# Patient Record
Sex: Male | Born: 1935
Health system: Southern US, Community
[De-identification: ages and names within clinical notes are randomized; demographics above are authoritative.]

## PROBLEM LIST (undated history)

## (undated) DIAGNOSIS — R55 Syncope and collapse: Secondary | ICD-10-CM

## (undated) DIAGNOSIS — M5430 Sciatica, unspecified side: Secondary | ICD-10-CM

## (undated) DIAGNOSIS — I10 Essential (primary) hypertension: Secondary | ICD-10-CM

## (undated) DIAGNOSIS — D696 Thrombocytopenia, unspecified: Secondary | ICD-10-CM

## (undated) DIAGNOSIS — I2510 Atherosclerotic heart disease of native coronary artery without angina pectoris: Secondary | ICD-10-CM

## (undated) DIAGNOSIS — E079 Disorder of thyroid, unspecified: Secondary | ICD-10-CM

## (undated) DIAGNOSIS — H539 Unspecified visual disturbance: Secondary | ICD-10-CM

## (undated) DIAGNOSIS — F0390 Unspecified dementia without behavioral disturbance: Secondary | ICD-10-CM

## (undated) DIAGNOSIS — J939 Pneumothorax, unspecified: Secondary | ICD-10-CM

## (undated) DIAGNOSIS — R413 Other amnesia: Secondary | ICD-10-CM

## (undated) DIAGNOSIS — I6529 Occlusion and stenosis of unspecified carotid artery: Secondary | ICD-10-CM

## (undated) DIAGNOSIS — I4891 Unspecified atrial fibrillation: Secondary | ICD-10-CM

## (undated) HISTORY — PX: ACHILLES TENDON REPAIR: SUR1153

## (undated) HISTORY — PX: CORONARY ANGIOPLASTY WITH STENT PLACEMENT: SHX49

## (undated) HISTORY — PX: CATARACT EXTRACTION: SUR2

## (undated) HISTORY — DX: Syncope and collapse: R55

## (undated) HISTORY — PX: INGUINAL HERNIA REPAIR: SUR1180

## (undated) HISTORY — PX: CAROTID ENDARTERECTOMY: SUR193

## (undated) HISTORY — PX: HERNIA REPAIR: SHX51

## (undated) HISTORY — PX: TONSILLECTOMY: SUR1361

## (undated) HISTORY — DX: Other amnesia: R41.3

## (undated) HISTORY — DX: Unspecified visual disturbance: H53.9

---

## 2007-03-21 HISTORY — PX: CORONARY STENT PLACEMENT: SHX1402

## 2011-04-21 DIAGNOSIS — B070 Plantar wart: Secondary | ICD-10-CM | POA: Diagnosis not present

## 2011-04-21 DIAGNOSIS — B351 Tinea unguium: Secondary | ICD-10-CM | POA: Diagnosis not present

## 2011-04-21 DIAGNOSIS — L600 Ingrowing nail: Secondary | ICD-10-CM | POA: Diagnosis not present

## 2011-04-24 DIAGNOSIS — IMO0002 Reserved for concepts with insufficient information to code with codable children: Secondary | ICD-10-CM | POA: Diagnosis not present

## 2011-04-26 DIAGNOSIS — M549 Dorsalgia, unspecified: Secondary | ICD-10-CM | POA: Diagnosis not present

## 2011-04-26 DIAGNOSIS — I872 Venous insufficiency (chronic) (peripheral): Secondary | ICD-10-CM | POA: Diagnosis not present

## 2011-04-26 DIAGNOSIS — M479 Spondylosis, unspecified: Secondary | ICD-10-CM | POA: Diagnosis not present

## 2011-06-26 DIAGNOSIS — I4891 Unspecified atrial fibrillation: Secondary | ICD-10-CM | POA: Diagnosis not present

## 2011-06-26 DIAGNOSIS — I2510 Atherosclerotic heart disease of native coronary artery without angina pectoris: Secondary | ICD-10-CM | POA: Diagnosis not present

## 2011-06-26 DIAGNOSIS — I6350 Cerebral infarction due to unspecified occlusion or stenosis of unspecified cerebral artery: Secondary | ICD-10-CM | POA: Diagnosis not present

## 2011-07-12 DIAGNOSIS — R609 Edema, unspecified: Secondary | ICD-10-CM | POA: Diagnosis not present

## 2011-07-12 DIAGNOSIS — I517 Cardiomegaly: Secondary | ICD-10-CM | POA: Diagnosis not present

## 2011-07-12 DIAGNOSIS — I6529 Occlusion and stenosis of unspecified carotid artery: Secondary | ICD-10-CM | POA: Diagnosis not present

## 2011-07-12 DIAGNOSIS — I658 Occlusion and stenosis of other precerebral arteries: Secondary | ICD-10-CM | POA: Diagnosis not present

## 2011-07-17 DIAGNOSIS — I10 Essential (primary) hypertension: Secondary | ICD-10-CM | POA: Diagnosis not present

## 2011-07-17 DIAGNOSIS — E785 Hyperlipidemia, unspecified: Secondary | ICD-10-CM | POA: Diagnosis not present

## 2011-07-17 DIAGNOSIS — I2510 Atherosclerotic heart disease of native coronary artery without angina pectoris: Secondary | ICD-10-CM | POA: Diagnosis not present

## 2011-07-17 DIAGNOSIS — I4891 Unspecified atrial fibrillation: Secondary | ICD-10-CM | POA: Diagnosis not present

## 2011-07-27 DIAGNOSIS — H04129 Dry eye syndrome of unspecified lacrimal gland: Secondary | ICD-10-CM | POA: Diagnosis not present

## 2011-07-27 DIAGNOSIS — I951 Orthostatic hypotension: Secondary | ICD-10-CM | POA: Diagnosis not present

## 2011-07-27 DIAGNOSIS — R609 Edema, unspecified: Secondary | ICD-10-CM | POA: Diagnosis not present

## 2011-07-27 DIAGNOSIS — R7309 Other abnormal glucose: Secondary | ICD-10-CM | POA: Diagnosis not present

## 2011-08-03 DIAGNOSIS — I4891 Unspecified atrial fibrillation: Secondary | ICD-10-CM | POA: Diagnosis not present

## 2011-08-03 DIAGNOSIS — I6529 Occlusion and stenosis of unspecified carotid artery: Secondary | ICD-10-CM | POA: Diagnosis not present

## 2011-08-03 DIAGNOSIS — R413 Other amnesia: Secondary | ICD-10-CM | POA: Diagnosis not present

## 2011-08-07 DIAGNOSIS — H2510 Age-related nuclear cataract, unspecified eye: Secondary | ICD-10-CM | POA: Diagnosis not present

## 2011-08-28 DIAGNOSIS — L600 Ingrowing nail: Secondary | ICD-10-CM | POA: Diagnosis not present

## 2011-08-28 DIAGNOSIS — B351 Tinea unguium: Secondary | ICD-10-CM | POA: Diagnosis not present

## 2011-10-12 DIAGNOSIS — I6529 Occlusion and stenosis of unspecified carotid artery: Secondary | ICD-10-CM | POA: Diagnosis not present

## 2011-10-12 DIAGNOSIS — R413 Other amnesia: Secondary | ICD-10-CM | POA: Diagnosis not present

## 2011-10-12 DIAGNOSIS — I4891 Unspecified atrial fibrillation: Secondary | ICD-10-CM | POA: Diagnosis not present

## 2011-10-12 DIAGNOSIS — M549 Dorsalgia, unspecified: Secondary | ICD-10-CM | POA: Diagnosis not present

## 2011-10-19 DIAGNOSIS — I779 Disorder of arteries and arterioles, unspecified: Secondary | ICD-10-CM | POA: Diagnosis not present

## 2011-10-19 DIAGNOSIS — I6529 Occlusion and stenosis of unspecified carotid artery: Secondary | ICD-10-CM | POA: Diagnosis not present

## 2011-10-26 DIAGNOSIS — H2510 Age-related nuclear cataract, unspecified eye: Secondary | ICD-10-CM | POA: Diagnosis not present

## 2011-11-06 DIAGNOSIS — H259 Unspecified age-related cataract: Secondary | ICD-10-CM | POA: Diagnosis not present

## 2011-11-06 DIAGNOSIS — H2510 Age-related nuclear cataract, unspecified eye: Secondary | ICD-10-CM | POA: Diagnosis not present

## 2011-11-23 DIAGNOSIS — R413 Other amnesia: Secondary | ICD-10-CM | POA: Diagnosis not present

## 2011-11-23 DIAGNOSIS — M549 Dorsalgia, unspecified: Secondary | ICD-10-CM | POA: Diagnosis not present

## 2011-11-23 DIAGNOSIS — I4891 Unspecified atrial fibrillation: Secondary | ICD-10-CM | POA: Diagnosis not present

## 2011-11-23 DIAGNOSIS — I6529 Occlusion and stenosis of unspecified carotid artery: Secondary | ICD-10-CM | POA: Diagnosis not present

## 2011-11-30 DIAGNOSIS — H2510 Age-related nuclear cataract, unspecified eye: Secondary | ICD-10-CM | POA: Diagnosis not present

## 2011-12-01 DIAGNOSIS — I6789 Other cerebrovascular disease: Secondary | ICD-10-CM | POA: Diagnosis not present

## 2011-12-01 DIAGNOSIS — I6529 Occlusion and stenosis of unspecified carotid artery: Secondary | ICD-10-CM | POA: Diagnosis not present

## 2011-12-01 DIAGNOSIS — R413 Other amnesia: Secondary | ICD-10-CM | POA: Diagnosis not present

## 2011-12-07 DIAGNOSIS — I6529 Occlusion and stenosis of unspecified carotid artery: Secondary | ICD-10-CM | POA: Diagnosis not present

## 2011-12-07 DIAGNOSIS — M549 Dorsalgia, unspecified: Secondary | ICD-10-CM | POA: Diagnosis not present

## 2011-12-07 DIAGNOSIS — R413 Other amnesia: Secondary | ICD-10-CM | POA: Diagnosis not present

## 2011-12-07 DIAGNOSIS — I4891 Unspecified atrial fibrillation: Secondary | ICD-10-CM | POA: Diagnosis not present

## 2011-12-11 DIAGNOSIS — I6529 Occlusion and stenosis of unspecified carotid artery: Secondary | ICD-10-CM | POA: Diagnosis not present

## 2011-12-18 DIAGNOSIS — H259 Unspecified age-related cataract: Secondary | ICD-10-CM | POA: Diagnosis not present

## 2011-12-18 DIAGNOSIS — H2510 Age-related nuclear cataract, unspecified eye: Secondary | ICD-10-CM | POA: Diagnosis not present

## 2012-01-04 DIAGNOSIS — I6529 Occlusion and stenosis of unspecified carotid artery: Secondary | ICD-10-CM | POA: Diagnosis not present

## 2012-01-04 DIAGNOSIS — E785 Hyperlipidemia, unspecified: Secondary | ICD-10-CM | POA: Diagnosis not present

## 2012-01-04 DIAGNOSIS — G609 Hereditary and idiopathic neuropathy, unspecified: Secondary | ICD-10-CM | POA: Diagnosis not present

## 2012-01-04 DIAGNOSIS — R413 Other amnesia: Secondary | ICD-10-CM | POA: Diagnosis not present

## 2012-01-04 DIAGNOSIS — Z23 Encounter for immunization: Secondary | ICD-10-CM | POA: Diagnosis not present

## 2012-01-05 DIAGNOSIS — E785 Hyperlipidemia, unspecified: Secondary | ICD-10-CM | POA: Diagnosis not present

## 2012-01-05 DIAGNOSIS — Z125 Encounter for screening for malignant neoplasm of prostate: Secondary | ICD-10-CM | POA: Diagnosis not present

## 2012-01-05 DIAGNOSIS — I10 Essential (primary) hypertension: Secondary | ICD-10-CM | POA: Diagnosis not present

## 2012-01-05 DIAGNOSIS — Z79899 Other long term (current) drug therapy: Secondary | ICD-10-CM | POA: Diagnosis not present

## 2012-01-08 DIAGNOSIS — I4891 Unspecified atrial fibrillation: Secondary | ICD-10-CM | POA: Diagnosis not present

## 2012-01-08 DIAGNOSIS — E785 Hyperlipidemia, unspecified: Secondary | ICD-10-CM | POA: Diagnosis not present

## 2012-01-08 DIAGNOSIS — I2510 Atherosclerotic heart disease of native coronary artery without angina pectoris: Secondary | ICD-10-CM | POA: Diagnosis not present

## 2012-01-08 DIAGNOSIS — I359 Nonrheumatic aortic valve disorder, unspecified: Secondary | ICD-10-CM | POA: Diagnosis not present

## 2012-04-04 DIAGNOSIS — R413 Other amnesia: Secondary | ICD-10-CM | POA: Diagnosis not present

## 2012-04-04 DIAGNOSIS — M549 Dorsalgia, unspecified: Secondary | ICD-10-CM | POA: Diagnosis not present

## 2012-04-04 DIAGNOSIS — I4891 Unspecified atrial fibrillation: Secondary | ICD-10-CM | POA: Diagnosis not present

## 2012-04-04 DIAGNOSIS — I6529 Occlusion and stenosis of unspecified carotid artery: Secondary | ICD-10-CM | POA: Diagnosis not present

## 2012-04-23 DIAGNOSIS — H26499 Other secondary cataract, unspecified eye: Secondary | ICD-10-CM | POA: Diagnosis not present

## 2012-04-26 DIAGNOSIS — B351 Tinea unguium: Secondary | ICD-10-CM | POA: Diagnosis not present

## 2012-04-26 DIAGNOSIS — L600 Ingrowing nail: Secondary | ICD-10-CM | POA: Diagnosis not present

## 2012-06-10 DIAGNOSIS — I6529 Occlusion and stenosis of unspecified carotid artery: Secondary | ICD-10-CM | POA: Diagnosis not present

## 2012-06-12 DIAGNOSIS — I6529 Occlusion and stenosis of unspecified carotid artery: Secondary | ICD-10-CM | POA: Diagnosis not present

## 2012-06-26 DIAGNOSIS — H26499 Other secondary cataract, unspecified eye: Secondary | ICD-10-CM | POA: Diagnosis not present

## 2012-06-27 DIAGNOSIS — I517 Cardiomegaly: Secondary | ICD-10-CM | POA: Diagnosis not present

## 2012-06-27 DIAGNOSIS — I080 Rheumatic disorders of both mitral and aortic valves: Secondary | ICD-10-CM | POA: Diagnosis not present

## 2012-06-27 DIAGNOSIS — I4891 Unspecified atrial fibrillation: Secondary | ICD-10-CM | POA: Diagnosis not present

## 2012-06-27 DIAGNOSIS — I359 Nonrheumatic aortic valve disorder, unspecified: Secondary | ICD-10-CM | POA: Diagnosis not present

## 2012-07-04 DIAGNOSIS — I10 Essential (primary) hypertension: Secondary | ICD-10-CM | POA: Diagnosis not present

## 2012-07-04 DIAGNOSIS — R413 Other amnesia: Secondary | ICD-10-CM | POA: Diagnosis not present

## 2012-07-04 DIAGNOSIS — E785 Hyperlipidemia, unspecified: Secondary | ICD-10-CM | POA: Diagnosis not present

## 2012-07-15 DIAGNOSIS — L600 Ingrowing nail: Secondary | ICD-10-CM | POA: Diagnosis not present

## 2012-07-15 DIAGNOSIS — B351 Tinea unguium: Secondary | ICD-10-CM | POA: Diagnosis not present

## 2012-07-17 DIAGNOSIS — H26499 Other secondary cataract, unspecified eye: Secondary | ICD-10-CM | POA: Diagnosis not present

## 2012-07-30 DIAGNOSIS — R609 Edema, unspecified: Secondary | ICD-10-CM | POA: Diagnosis not present

## 2012-07-30 DIAGNOSIS — I6529 Occlusion and stenosis of unspecified carotid artery: Secondary | ICD-10-CM | POA: Diagnosis not present

## 2012-07-30 DIAGNOSIS — I359 Nonrheumatic aortic valve disorder, unspecified: Secondary | ICD-10-CM | POA: Diagnosis not present

## 2012-10-15 DIAGNOSIS — M779 Enthesopathy, unspecified: Secondary | ICD-10-CM | POA: Diagnosis not present

## 2012-10-15 DIAGNOSIS — M7660 Achilles tendinitis, unspecified leg: Secondary | ICD-10-CM | POA: Diagnosis not present

## 2012-10-18 DIAGNOSIS — M779 Enthesopathy, unspecified: Secondary | ICD-10-CM | POA: Diagnosis not present

## 2012-10-18 DIAGNOSIS — M7660 Achilles tendinitis, unspecified leg: Secondary | ICD-10-CM | POA: Diagnosis not present

## 2012-11-04 DIAGNOSIS — B351 Tinea unguium: Secondary | ICD-10-CM | POA: Diagnosis not present

## 2012-11-04 DIAGNOSIS — L600 Ingrowing nail: Secondary | ICD-10-CM | POA: Diagnosis not present

## 2012-11-28 DIAGNOSIS — M7660 Achilles tendinitis, unspecified leg: Secondary | ICD-10-CM | POA: Diagnosis not present

## 2012-11-28 DIAGNOSIS — M779 Enthesopathy, unspecified: Secondary | ICD-10-CM | POA: Diagnosis not present

## 2012-12-11 DIAGNOSIS — I4891 Unspecified atrial fibrillation: Secondary | ICD-10-CM | POA: Diagnosis not present

## 2012-12-11 DIAGNOSIS — I10 Essential (primary) hypertension: Secondary | ICD-10-CM | POA: Diagnosis not present

## 2012-12-11 DIAGNOSIS — E785 Hyperlipidemia, unspecified: Secondary | ICD-10-CM | POA: Diagnosis not present

## 2012-12-17 DIAGNOSIS — F0280 Dementia in other diseases classified elsewhere without behavioral disturbance: Secondary | ICD-10-CM | POA: Diagnosis not present

## 2012-12-19 DIAGNOSIS — H353190 Nonexudative age-related macular degeneration, unspecified eye, stage unspecified: Secondary | ICD-10-CM | POA: Diagnosis not present

## 2012-12-20 DIAGNOSIS — M7660 Achilles tendinitis, unspecified leg: Secondary | ICD-10-CM | POA: Diagnosis not present

## 2012-12-20 DIAGNOSIS — M779 Enthesopathy, unspecified: Secondary | ICD-10-CM | POA: Diagnosis not present

## 2012-12-27 DIAGNOSIS — I10 Essential (primary) hypertension: Secondary | ICD-10-CM | POA: Diagnosis not present

## 2012-12-27 DIAGNOSIS — E785 Hyperlipidemia, unspecified: Secondary | ICD-10-CM | POA: Diagnosis not present

## 2012-12-27 DIAGNOSIS — R7309 Other abnormal glucose: Secondary | ICD-10-CM | POA: Diagnosis not present

## 2012-12-27 DIAGNOSIS — M899 Disorder of bone, unspecified: Secondary | ICD-10-CM | POA: Diagnosis not present

## 2013-01-03 DIAGNOSIS — R7309 Other abnormal glucose: Secondary | ICD-10-CM | POA: Diagnosis not present

## 2013-01-03 DIAGNOSIS — F0280 Dementia in other diseases classified elsewhere without behavioral disturbance: Secondary | ICD-10-CM | POA: Diagnosis not present

## 2013-01-03 DIAGNOSIS — H905 Unspecified sensorineural hearing loss: Secondary | ICD-10-CM | POA: Diagnosis not present

## 2013-01-03 DIAGNOSIS — Z23 Encounter for immunization: Secondary | ICD-10-CM | POA: Diagnosis not present

## 2013-01-03 DIAGNOSIS — Z0000 Encounter for general adult medical examination without abnormal findings: Secondary | ICD-10-CM | POA: Diagnosis not present

## 2013-01-16 DIAGNOSIS — H6120 Impacted cerumen, unspecified ear: Secondary | ICD-10-CM | POA: Diagnosis not present

## 2013-01-16 DIAGNOSIS — H903 Sensorineural hearing loss, bilateral: Secondary | ICD-10-CM | POA: Diagnosis not present

## 2013-01-16 DIAGNOSIS — H9319 Tinnitus, unspecified ear: Secondary | ICD-10-CM | POA: Diagnosis not present

## 2013-01-16 DIAGNOSIS — J310 Chronic rhinitis: Secondary | ICD-10-CM | POA: Diagnosis not present

## 2013-01-16 DIAGNOSIS — H905 Unspecified sensorineural hearing loss: Secondary | ICD-10-CM | POA: Diagnosis not present

## 2013-01-17 DIAGNOSIS — H905 Unspecified sensorineural hearing loss: Secondary | ICD-10-CM | POA: Diagnosis not present

## 2013-03-10 DIAGNOSIS — M779 Enthesopathy, unspecified: Secondary | ICD-10-CM | POA: Diagnosis not present

## 2013-03-10 DIAGNOSIS — M7660 Achilles tendinitis, unspecified leg: Secondary | ICD-10-CM | POA: Diagnosis not present

## 2013-04-09 DIAGNOSIS — H9319 Tinnitus, unspecified ear: Secondary | ICD-10-CM | POA: Diagnosis not present

## 2013-04-09 DIAGNOSIS — H903 Sensorineural hearing loss, bilateral: Secondary | ICD-10-CM | POA: Diagnosis not present

## 2013-04-09 DIAGNOSIS — J310 Chronic rhinitis: Secondary | ICD-10-CM | POA: Diagnosis not present

## 2013-04-09 DIAGNOSIS — J3089 Other allergic rhinitis: Secondary | ICD-10-CM | POA: Diagnosis not present

## 2013-04-14 DIAGNOSIS — F0280 Dementia in other diseases classified elsewhere without behavioral disturbance: Secondary | ICD-10-CM | POA: Diagnosis not present

## 2013-04-14 DIAGNOSIS — G309 Alzheimer's disease, unspecified: Secondary | ICD-10-CM | POA: Diagnosis not present

## 2013-04-14 DIAGNOSIS — G609 Hereditary and idiopathic neuropathy, unspecified: Secondary | ICD-10-CM | POA: Diagnosis not present

## 2013-04-14 DIAGNOSIS — H9319 Tinnitus, unspecified ear: Secondary | ICD-10-CM | POA: Diagnosis not present

## 2013-05-02 DIAGNOSIS — B351 Tinea unguium: Secondary | ICD-10-CM | POA: Diagnosis not present

## 2013-05-02 DIAGNOSIS — L600 Ingrowing nail: Secondary | ICD-10-CM | POA: Diagnosis not present

## 2013-05-16 DIAGNOSIS — L600 Ingrowing nail: Secondary | ICD-10-CM | POA: Diagnosis not present

## 2013-05-16 DIAGNOSIS — B351 Tinea unguium: Secondary | ICD-10-CM | POA: Diagnosis not present

## 2013-06-10 DIAGNOSIS — R609 Edema, unspecified: Secondary | ICD-10-CM | POA: Diagnosis not present

## 2013-06-10 DIAGNOSIS — I4891 Unspecified atrial fibrillation: Secondary | ICD-10-CM | POA: Diagnosis not present

## 2013-06-10 DIAGNOSIS — I10 Essential (primary) hypertension: Secondary | ICD-10-CM | POA: Diagnosis not present

## 2013-06-10 DIAGNOSIS — I359 Nonrheumatic aortic valve disorder, unspecified: Secondary | ICD-10-CM | POA: Diagnosis not present

## 2013-06-11 DIAGNOSIS — I6529 Occlusion and stenosis of unspecified carotid artery: Secondary | ICD-10-CM | POA: Diagnosis not present

## 2013-06-13 DIAGNOSIS — I6529 Occlusion and stenosis of unspecified carotid artery: Secondary | ICD-10-CM | POA: Diagnosis not present

## 2013-06-19 DIAGNOSIS — H353190 Nonexudative age-related macular degeneration, unspecified eye, stage unspecified: Secondary | ICD-10-CM | POA: Diagnosis not present

## 2013-06-24 DIAGNOSIS — I359 Nonrheumatic aortic valve disorder, unspecified: Secondary | ICD-10-CM | POA: Diagnosis not present

## 2013-06-24 DIAGNOSIS — I10 Essential (primary) hypertension: Secondary | ICD-10-CM | POA: Diagnosis not present

## 2013-06-24 DIAGNOSIS — I517 Cardiomegaly: Secondary | ICD-10-CM | POA: Diagnosis not present

## 2013-06-24 DIAGNOSIS — I059 Rheumatic mitral valve disease, unspecified: Secondary | ICD-10-CM | POA: Diagnosis not present

## 2013-06-24 DIAGNOSIS — I4891 Unspecified atrial fibrillation: Secondary | ICD-10-CM | POA: Diagnosis not present

## 2013-06-24 DIAGNOSIS — R609 Edema, unspecified: Secondary | ICD-10-CM | POA: Diagnosis not present

## 2013-06-25 DIAGNOSIS — H26499 Other secondary cataract, unspecified eye: Secondary | ICD-10-CM | POA: Diagnosis not present

## 2013-06-27 DIAGNOSIS — I359 Nonrheumatic aortic valve disorder, unspecified: Secondary | ICD-10-CM | POA: Diagnosis not present

## 2013-06-27 DIAGNOSIS — R609 Edema, unspecified: Secondary | ICD-10-CM | POA: Diagnosis not present

## 2013-06-27 DIAGNOSIS — I4891 Unspecified atrial fibrillation: Secondary | ICD-10-CM | POA: Diagnosis not present

## 2013-06-27 DIAGNOSIS — I10 Essential (primary) hypertension: Secondary | ICD-10-CM | POA: Diagnosis not present

## 2013-07-14 DIAGNOSIS — I10 Essential (primary) hypertension: Secondary | ICD-10-CM | POA: Diagnosis not present

## 2013-07-14 DIAGNOSIS — F0280 Dementia in other diseases classified elsewhere without behavioral disturbance: Secondary | ICD-10-CM | POA: Diagnosis not present

## 2013-07-14 DIAGNOSIS — I872 Venous insufficiency (chronic) (peripheral): Secondary | ICD-10-CM | POA: Diagnosis not present

## 2013-07-14 DIAGNOSIS — G309 Alzheimer's disease, unspecified: Secondary | ICD-10-CM | POA: Diagnosis not present

## 2013-07-17 DIAGNOSIS — M949 Disorder of cartilage, unspecified: Secondary | ICD-10-CM | POA: Diagnosis not present

## 2013-07-17 DIAGNOSIS — R7309 Other abnormal glucose: Secondary | ICD-10-CM | POA: Diagnosis not present

## 2013-07-17 DIAGNOSIS — E785 Hyperlipidemia, unspecified: Secondary | ICD-10-CM | POA: Diagnosis not present

## 2013-07-17 DIAGNOSIS — M899 Disorder of bone, unspecified: Secondary | ICD-10-CM | POA: Diagnosis not present

## 2013-07-18 DIAGNOSIS — L600 Ingrowing nail: Secondary | ICD-10-CM | POA: Diagnosis not present

## 2013-07-18 DIAGNOSIS — B351 Tinea unguium: Secondary | ICD-10-CM | POA: Diagnosis not present

## 2013-07-31 DIAGNOSIS — G4700 Insomnia, unspecified: Secondary | ICD-10-CM | POA: Diagnosis not present

## 2013-07-31 DIAGNOSIS — H353190 Nonexudative age-related macular degeneration, unspecified eye, stage unspecified: Secondary | ICD-10-CM | POA: Diagnosis not present

## 2013-07-31 DIAGNOSIS — G309 Alzheimer's disease, unspecified: Secondary | ICD-10-CM | POA: Diagnosis not present

## 2013-07-31 DIAGNOSIS — I679 Cerebrovascular disease, unspecified: Secondary | ICD-10-CM | POA: Diagnosis not present

## 2013-07-31 DIAGNOSIS — F0280 Dementia in other diseases classified elsewhere without behavioral disturbance: Secondary | ICD-10-CM | POA: Diagnosis not present

## 2013-12-05 DIAGNOSIS — B351 Tinea unguium: Secondary | ICD-10-CM | POA: Diagnosis not present

## 2013-12-05 DIAGNOSIS — L600 Ingrowing nail: Secondary | ICD-10-CM | POA: Diagnosis not present

## 2013-12-09 DIAGNOSIS — G309 Alzheimer's disease, unspecified: Secondary | ICD-10-CM | POA: Diagnosis not present

## 2013-12-09 DIAGNOSIS — F0280 Dementia in other diseases classified elsewhere without behavioral disturbance: Secondary | ICD-10-CM | POA: Diagnosis not present

## 2013-12-12 DIAGNOSIS — R7309 Other abnormal glucose: Secondary | ICD-10-CM | POA: Diagnosis not present

## 2013-12-12 DIAGNOSIS — M899 Disorder of bone, unspecified: Secondary | ICD-10-CM | POA: Diagnosis not present

## 2013-12-12 DIAGNOSIS — E785 Hyperlipidemia, unspecified: Secondary | ICD-10-CM | POA: Diagnosis not present

## 2013-12-12 DIAGNOSIS — M949 Disorder of cartilage, unspecified: Secondary | ICD-10-CM | POA: Diagnosis not present

## 2013-12-17 DIAGNOSIS — Z23 Encounter for immunization: Secondary | ICD-10-CM | POA: Diagnosis not present

## 2013-12-17 DIAGNOSIS — I10 Essential (primary) hypertension: Secondary | ICD-10-CM | POA: Diagnosis not present

## 2013-12-17 DIAGNOSIS — F0280 Dementia in other diseases classified elsewhere without behavioral disturbance: Secondary | ICD-10-CM | POA: Diagnosis not present

## 2013-12-17 DIAGNOSIS — E559 Vitamin D deficiency, unspecified: Secondary | ICD-10-CM | POA: Diagnosis not present

## 2013-12-17 DIAGNOSIS — G309 Alzheimer's disease, unspecified: Secondary | ICD-10-CM | POA: Diagnosis not present

## 2013-12-17 DIAGNOSIS — R7309 Other abnormal glucose: Secondary | ICD-10-CM | POA: Diagnosis not present

## 2013-12-22 DIAGNOSIS — I480 Paroxysmal atrial fibrillation: Secondary | ICD-10-CM | POA: Diagnosis not present

## 2014-01-02 DIAGNOSIS — H3531 Nonexudative age-related macular degeneration: Secondary | ICD-10-CM | POA: Diagnosis not present

## 2014-02-18 ENCOUNTER — Inpatient Hospital Stay (HOSPITAL_COMMUNITY): Payer: Medicare Other

## 2014-02-18 ENCOUNTER — Emergency Department (HOSPITAL_COMMUNITY): Payer: Medicare Other

## 2014-02-18 ENCOUNTER — Inpatient Hospital Stay (HOSPITAL_COMMUNITY)
Admission: EM | Admit: 2014-02-18 | Discharge: 2014-02-24 | DRG: 200 | Disposition: A | Discharge status: Home Health | Payer: Medicare Other | Attending: Internal Medicine | Admitting: Internal Medicine

## 2014-02-18 ENCOUNTER — Encounter (HOSPITAL_COMMUNITY): Payer: Self-pay

## 2014-02-18 DIAGNOSIS — I2510 Atherosclerotic heart disease of native coronary artery without angina pectoris: Secondary | ICD-10-CM | POA: Diagnosis present

## 2014-02-18 DIAGNOSIS — D696 Thrombocytopenia, unspecified: Secondary | ICD-10-CM | POA: Diagnosis present

## 2014-02-18 DIAGNOSIS — R269 Unspecified abnormalities of gait and mobility: Secondary | ICD-10-CM | POA: Diagnosis present

## 2014-02-18 DIAGNOSIS — S301XXA Contusion of abdominal wall, initial encounter: Secondary | ICD-10-CM | POA: Diagnosis not present

## 2014-02-18 DIAGNOSIS — I951 Orthostatic hypotension: Secondary | ICD-10-CM | POA: Diagnosis present

## 2014-02-18 DIAGNOSIS — G309 Alzheimer's disease, unspecified: Secondary | ICD-10-CM | POA: Diagnosis present

## 2014-02-18 DIAGNOSIS — S2241XA Multiple fractures of ribs, right side, initial encounter for closed fracture: Secondary | ICD-10-CM | POA: Diagnosis present

## 2014-02-18 DIAGNOSIS — Y9301 Activity, walking, marching and hiking: Secondary | ICD-10-CM | POA: Diagnosis not present

## 2014-02-18 DIAGNOSIS — F0390 Unspecified dementia without behavioral disturbance: Secondary | ICD-10-CM | POA: Diagnosis present

## 2014-02-18 DIAGNOSIS — S2249XA Multiple fractures of ribs, unspecified side, initial encounter for closed fracture: Secondary | ICD-10-CM | POA: Diagnosis present

## 2014-02-18 DIAGNOSIS — E785 Hyperlipidemia, unspecified: Secondary | ICD-10-CM | POA: Diagnosis present

## 2014-02-18 DIAGNOSIS — I4891 Unspecified atrial fibrillation: Secondary | ICD-10-CM | POA: Diagnosis not present

## 2014-02-18 DIAGNOSIS — R296 Repeated falls: Secondary | ICD-10-CM | POA: Diagnosis present

## 2014-02-18 DIAGNOSIS — R221 Localized swelling, mass and lump, neck: Secondary | ICD-10-CM | POA: Diagnosis not present

## 2014-02-18 DIAGNOSIS — I6529 Occlusion and stenosis of unspecified carotid artery: Secondary | ICD-10-CM | POA: Diagnosis present

## 2014-02-18 DIAGNOSIS — Z955 Presence of coronary angioplasty implant and graft: Secondary | ICD-10-CM

## 2014-02-18 DIAGNOSIS — I480 Paroxysmal atrial fibrillation: Secondary | ICD-10-CM | POA: Diagnosis present

## 2014-02-18 DIAGNOSIS — J9811 Atelectasis: Secondary | ICD-10-CM | POA: Diagnosis not present

## 2014-02-18 DIAGNOSIS — Y92013 Bedroom of single-family (private) house as the place of occurrence of the external cause: Secondary | ICD-10-CM

## 2014-02-18 DIAGNOSIS — I959 Hypotension, unspecified: Secondary | ICD-10-CM | POA: Diagnosis not present

## 2014-02-18 DIAGNOSIS — N2889 Other specified disorders of kidney and ureter: Secondary | ICD-10-CM | POA: Diagnosis not present

## 2014-02-18 DIAGNOSIS — R55 Syncope and collapse: Secondary | ICD-10-CM | POA: Diagnosis not present

## 2014-02-18 DIAGNOSIS — I350 Nonrheumatic aortic (valve) stenosis: Secondary | ICD-10-CM | POA: Diagnosis present

## 2014-02-18 DIAGNOSIS — S2241XD Multiple fractures of ribs, right side, subsequent encounter for fracture with routine healing: Secondary | ICD-10-CM | POA: Diagnosis not present

## 2014-02-18 DIAGNOSIS — S2239XA Fracture of one rib, unspecified side, initial encounter for closed fracture: Secondary | ICD-10-CM

## 2014-02-18 DIAGNOSIS — W1839XA Other fall on same level, initial encounter: Secondary | ICD-10-CM | POA: Diagnosis present

## 2014-02-18 DIAGNOSIS — Z9181 History of falling: Secondary | ICD-10-CM

## 2014-02-18 DIAGNOSIS — S0081XA Abrasion of other part of head, initial encounter: Secondary | ICD-10-CM | POA: Diagnosis present

## 2014-02-18 DIAGNOSIS — W19XXXA Unspecified fall, initial encounter: Secondary | ICD-10-CM

## 2014-02-18 DIAGNOSIS — S271XXA Traumatic hemothorax, initial encounter: Secondary | ICD-10-CM | POA: Diagnosis not present

## 2014-02-18 DIAGNOSIS — J984 Other disorders of lung: Secondary | ICD-10-CM | POA: Diagnosis not present

## 2014-02-18 DIAGNOSIS — J939 Pneumothorax, unspecified: Secondary | ICD-10-CM | POA: Diagnosis not present

## 2014-02-18 DIAGNOSIS — S064X0A Epidural hemorrhage without loss of consciousness, initial encounter: Secondary | ICD-10-CM | POA: Diagnosis not present

## 2014-02-18 DIAGNOSIS — S272XXA Traumatic hemopneumothorax, initial encounter: Secondary | ICD-10-CM | POA: Diagnosis not present

## 2014-02-18 DIAGNOSIS — I10 Essential (primary) hypertension: Secondary | ICD-10-CM | POA: Diagnosis present

## 2014-02-18 DIAGNOSIS — Z8673 Personal history of transient ischemic attack (TIA), and cerebral infarction without residual deficits: Secondary | ICD-10-CM

## 2014-02-18 DIAGNOSIS — J90 Pleural effusion, not elsewhere classified: Secondary | ICD-10-CM | POA: Diagnosis not present

## 2014-02-18 DIAGNOSIS — R0602 Shortness of breath: Secondary | ICD-10-CM | POA: Diagnosis not present

## 2014-02-18 DIAGNOSIS — J942 Hemothorax: Secondary | ICD-10-CM

## 2014-02-18 DIAGNOSIS — E079 Disorder of thyroid, unspecified: Secondary | ICD-10-CM | POA: Diagnosis present

## 2014-02-18 DIAGNOSIS — Z4682 Encounter for fitting and adjustment of non-vascular catheter: Secondary | ICD-10-CM | POA: Diagnosis not present

## 2014-02-18 DIAGNOSIS — F0280 Dementia in other diseases classified elsewhere without behavioral disturbance: Secondary | ICD-10-CM | POA: Diagnosis present

## 2014-02-18 DIAGNOSIS — E119 Type 2 diabetes mellitus without complications: Secondary | ICD-10-CM | POA: Diagnosis present

## 2014-02-18 DIAGNOSIS — S270XXA Traumatic pneumothorax, initial encounter: Secondary | ICD-10-CM | POA: Diagnosis not present

## 2014-02-18 DIAGNOSIS — S2760XA Unspecified injury of pleura, initial encounter: Secondary | ICD-10-CM | POA: Diagnosis not present

## 2014-02-18 DIAGNOSIS — S0990XA Unspecified injury of head, initial encounter: Secondary | ICD-10-CM

## 2014-02-18 DIAGNOSIS — M5030 Other cervical disc degeneration, unspecified cervical region: Secondary | ICD-10-CM | POA: Diagnosis not present

## 2014-02-18 DIAGNOSIS — R413 Other amnesia: Secondary | ICD-10-CM | POA: Diagnosis not present

## 2014-02-18 DIAGNOSIS — S270XXD Traumatic pneumothorax, subsequent encounter: Secondary | ICD-10-CM | POA: Diagnosis not present

## 2014-02-18 DIAGNOSIS — M549 Dorsalgia, unspecified: Secondary | ICD-10-CM | POA: Diagnosis present

## 2014-02-18 DIAGNOSIS — S098XXA Other specified injuries of head, initial encounter: Secondary | ICD-10-CM | POA: Diagnosis not present

## 2014-02-18 DIAGNOSIS — T797XXA Traumatic subcutaneous emphysema, initial encounter: Secondary | ICD-10-CM

## 2014-02-18 DIAGNOSIS — R0781 Pleurodynia: Secondary | ICD-10-CM | POA: Diagnosis not present

## 2014-02-18 DIAGNOSIS — I359 Nonrheumatic aortic valve disorder, unspecified: Secondary | ICD-10-CM | POA: Diagnosis not present

## 2014-02-18 HISTORY — DX: Atherosclerotic heart disease of native coronary artery without angina pectoris: I25.10

## 2014-02-18 HISTORY — DX: Disorder of thyroid, unspecified: E07.9

## 2014-02-18 HISTORY — DX: Occlusion and stenosis of unspecified carotid artery: I65.29

## 2014-02-18 HISTORY — DX: Unspecified atrial fibrillation: I48.91

## 2014-02-18 HISTORY — DX: Essential (primary) hypertension: I10

## 2014-02-18 HISTORY — DX: Thrombocytopenia, unspecified: D69.6

## 2014-02-18 HISTORY — DX: Sciatica, unspecified side: M54.30

## 2014-02-18 HISTORY — DX: Pneumothorax, unspecified: J93.9

## 2014-02-18 HISTORY — PX: CHEST TUBE INSERTION: SHX231

## 2014-02-18 HISTORY — DX: Unspecified dementia, unspecified severity, without behavioral disturbance, psychotic disturbance, mood disturbance, and anxiety: F03.90

## 2014-02-18 LAB — I-STAT TROPONIN, ED: TROPONIN I, POC: 0 ng/mL (ref 0.00–0.08)

## 2014-02-18 LAB — CBC WITH DIFFERENTIAL/PLATELET
Basophils Absolute: 0 10*3/uL (ref 0.0–0.1)
Basophils Relative: 0 % (ref 0–1)
EOS ABS: 0.1 10*3/uL (ref 0.0–0.7)
Eosinophils Relative: 1 % (ref 0–5)
HCT: 39.2 % (ref 39.0–52.0)
Hemoglobin: 13 g/dL (ref 13.0–17.0)
Lymphocytes Relative: 12 % (ref 12–46)
Lymphs Abs: 1.1 10*3/uL (ref 0.7–4.0)
MCH: 31 pg (ref 26.0–34.0)
MCHC: 33.2 g/dL (ref 30.0–36.0)
MCV: 93.3 fL (ref 78.0–100.0)
MONOS PCT: 6 % (ref 3–12)
Monocytes Absolute: 0.5 10*3/uL (ref 0.1–1.0)
NEUTROS PCT: 81 % — AB (ref 43–77)
Neutro Abs: 7.4 10*3/uL (ref 1.7–7.7)
PLATELETS: 130 10*3/uL — AB (ref 150–400)
RBC: 4.2 MIL/uL — ABNORMAL LOW (ref 4.22–5.81)
RDW: 13.9 % (ref 11.5–15.5)
WBC: 9.2 10*3/uL (ref 4.0–10.5)

## 2014-02-18 LAB — CBC
HCT: 31.7 % — ABNORMAL LOW (ref 39.0–52.0)
Hemoglobin: 10.4 g/dL — ABNORMAL LOW (ref 13.0–17.0)
MCH: 30.9 pg (ref 26.0–34.0)
MCHC: 32.8 g/dL (ref 30.0–36.0)
MCV: 94.1 fL (ref 78.0–100.0)
Platelets: 120 10*3/uL — ABNORMAL LOW (ref 150–400)
RBC: 3.37 MIL/uL — ABNORMAL LOW (ref 4.22–5.81)
RDW: 13.9 % (ref 11.5–15.5)
WBC: 8.8 10*3/uL (ref 4.0–10.5)

## 2014-02-18 LAB — URINALYSIS, ROUTINE W REFLEX MICROSCOPIC
Bilirubin Urine: NEGATIVE
Glucose, UA: NEGATIVE mg/dL
Hgb urine dipstick: NEGATIVE
KETONES UR: 15 mg/dL — AB
Leukocytes, UA: NEGATIVE
NITRITE: NEGATIVE
PH: 5 (ref 5.0–8.0)
PROTEIN: NEGATIVE mg/dL
Specific Gravity, Urine: 1.042 — ABNORMAL HIGH (ref 1.005–1.030)
Urobilinogen, UA: 0.2 mg/dL (ref 0.0–1.0)

## 2014-02-18 LAB — COMPREHENSIVE METABOLIC PANEL
ALBUMIN: 3.7 g/dL (ref 3.5–5.2)
ALK PHOS: 63 U/L (ref 39–117)
ALT: 25 U/L (ref 0–53)
ANION GAP: 10 (ref 5–15)
AST: 30 U/L (ref 0–37)
BUN: 20 mg/dL (ref 6–23)
CO2: 27 mEq/L (ref 19–32)
Calcium: 9 mg/dL (ref 8.4–10.5)
Chloride: 106 mEq/L (ref 96–112)
Creatinine, Ser: 0.92 mg/dL (ref 0.50–1.35)
GFR calc Af Amer: >90 mL/min (ref >90)
GFR calc non Af Amer: 78 mL/min — ABNORMAL LOW (ref >90)
Glucose, Bld: 132 mg/dL — ABNORMAL HIGH (ref 70–99)
POTASSIUM: 5.1 meq/L (ref 3.7–5.3)
SODIUM: 143 meq/L (ref 137–147)
TOTAL PROTEIN: 6.4 g/dL (ref 6.0–8.3)
Total Bilirubin: 0.9 mg/dL (ref 0.3–1.2)

## 2014-02-18 LAB — GLUCOSE, CAPILLARY
GLUCOSE-CAPILLARY: 136 mg/dL — AB (ref 70–99)
Glucose-Capillary: 231 mg/dL — ABNORMAL HIGH (ref 70–99)

## 2014-02-18 LAB — LACTIC ACID, PLASMA: LACTIC ACID, VENOUS: 0.7 mmol/L (ref 0.5–2.2)

## 2014-02-18 LAB — TROPONIN I

## 2014-02-18 LAB — HEMOGLOBIN A1C
Hgb A1c MFr Bld: 5.8 % — ABNORMAL HIGH (ref <5.7)
Mean Plasma Glucose: 120 mg/dL — ABNORMAL HIGH (ref <117)

## 2014-02-18 LAB — TSH: TSH: 0.924 u[IU]/mL (ref 0.350–4.500)

## 2014-02-18 MED ORDER — MAGNESIUM OXIDE 400 (241.3 MG) MG PO TABS
400.0000 mg | ORAL_TABLET | Freq: Every day | ORAL | Status: DC
  Administered 2014-02-18 – 2014-02-21 (×4): 400 mg via ORAL
  Filled 2014-02-18 (×4): qty 1

## 2014-02-18 MED ORDER — FLUTICASONE PROPIONATE 50 MCG/ACT NA SUSP
2.0000 | Freq: Every day | NASAL | Status: DC
Start: 2014-02-18 — End: 2014-02-24
  Administered 2014-02-23 – 2014-02-24 (×2): 2 via NASAL
  Filled 2014-02-18: qty 16

## 2014-02-18 MED ORDER — FENTANYL CITRATE 0.05 MG/ML IJ SOLN
100.0000 ug | Freq: Once | INTRAMUSCULAR | Status: AC
  Administered 2014-02-18: 100 ug via INTRAVENOUS
  Filled 2014-02-18: qty 2

## 2014-02-18 MED ORDER — HYDROCODONE-ACETAMINOPHEN 5-325 MG PO TABS
2.0000 | ORAL_TABLET | Freq: Once | ORAL | Status: AC
  Administered 2014-02-18: 2 via ORAL
  Filled 2014-02-18: qty 2

## 2014-02-18 MED ORDER — GABAPENTIN 400 MG PO CAPS
400.0000 mg | ORAL_CAPSULE | Freq: Every day | ORAL | Status: DC
  Administered 2014-02-18 – 2014-02-24 (×7): 400 mg via ORAL
  Filled 2014-02-18 (×7): qty 1

## 2014-02-18 MED ORDER — SODIUM CHLORIDE 0.9 % IJ SOLN
3.0000 mL | Freq: Two times a day (BID) | INTRAMUSCULAR | Status: DC
  Administered 2014-02-19 – 2014-02-23 (×7): 3 mL via INTRAVENOUS

## 2014-02-18 MED ORDER — MEMANTINE HCL ER 7 MG PO CP24
21.0000 mg | ORAL_CAPSULE | Freq: Every day | ORAL | Status: DC
  Administered 2014-02-18 – 2014-02-23 (×6): 21 mg via ORAL
  Filled 2014-02-18 (×6): qty 3

## 2014-02-18 MED ORDER — B COMPLEX-C PO TABS
1.0000 | ORAL_TABLET | Freq: Every day | ORAL | Status: DC
  Administered 2014-02-19 – 2014-02-24 (×6): 1 via ORAL
  Filled 2014-02-18 (×6): qty 1

## 2014-02-18 MED ORDER — SOTALOL HCL 80 MG PO TABS
40.0000 mg | ORAL_TABLET | Freq: Two times a day (BID) | ORAL | Status: DC
  Administered 2014-02-18 – 2014-02-24 (×12): 40 mg via ORAL
  Filled 2014-02-18 (×14): qty 0.5

## 2014-02-18 MED ORDER — ATORVASTATIN CALCIUM 80 MG PO TABS
80.0000 mg | ORAL_TABLET | Freq: Every day | ORAL | Status: DC
  Administered 2014-02-18 – 2014-02-24 (×7): 80 mg via ORAL
  Filled 2014-02-18 (×9): qty 1

## 2014-02-18 MED ORDER — EZETIMIBE 10 MG PO TABS
10.0000 mg | ORAL_TABLET | Freq: Every day | ORAL | Status: DC
  Administered 2014-02-18 – 2014-02-24 (×7): 10 mg via ORAL
  Filled 2014-02-18 (×7): qty 1

## 2014-02-18 MED ORDER — HYDROCODONE-ACETAMINOPHEN 5-325 MG PO TABS
1.0000 | ORAL_TABLET | ORAL | Status: DC | PRN
  Administered 2014-02-18 (×2): 2 via ORAL
  Administered 2014-02-19 – 2014-02-20 (×2): 1 via ORAL
  Administered 2014-02-21 – 2014-02-22 (×3): 2 via ORAL
  Filled 2014-02-18: qty 2
  Filled 2014-02-18: qty 1
  Filled 2014-02-18 (×4): qty 2
  Filled 2014-02-18: qty 1

## 2014-02-18 MED ORDER — IOHEXOL 300 MG/ML  SOLN
100.0000 mL | Freq: Once | INTRAMUSCULAR | Status: AC | PRN
  Administered 2014-02-18: 100 mL via INTRAVENOUS

## 2014-02-18 MED ORDER — DONEPEZIL HCL 10 MG PO TABS
10.0000 mg | ORAL_TABLET | Freq: Every day | ORAL | Status: DC
  Administered 2014-02-18 – 2014-02-24 (×7): 10 mg via ORAL
  Filled 2014-02-18 (×8): qty 1

## 2014-02-18 MED ORDER — SODIUM CHLORIDE 0.9 % IV BOLUS (SEPSIS)
1000.0000 mL | Freq: Once | INTRAVENOUS | Status: AC
  Administered 2014-02-18: 1000 mL via INTRAVENOUS

## 2014-02-18 MED ORDER — SODIUM CHLORIDE 0.9 % IV BOLUS (SEPSIS)
500.0000 mL | Freq: Once | INTRAVENOUS | Status: AC
  Administered 2014-02-18: 500 mL via INTRAVENOUS

## 2014-02-18 MED ORDER — INSULIN ASPART 100 UNIT/ML ~~LOC~~ SOLN
0.0000 [IU] | Freq: Three times a day (TID) | SUBCUTANEOUS | Status: DC
  Administered 2014-02-19 – 2014-02-20 (×2): 1 [IU] via SUBCUTANEOUS

## 2014-02-18 MED ORDER — MIDAZOLAM HCL 2 MG/2ML IJ SOLN
2.0000 mg | Freq: Once | INTRAMUSCULAR | Status: AC
  Administered 2014-02-18: 2 mg via INTRAVENOUS
  Filled 2014-02-18: qty 2

## 2014-02-18 MED ORDER — SODIUM CHLORIDE 0.9 % IV SOLN
INTRAVENOUS | Status: AC
  Administered 2014-02-18 (×2): via INTRAVENOUS

## 2014-02-18 MED ORDER — LIDOCAINE HCL (PF) 1 % IJ SOLN
INTRAMUSCULAR | Status: AC
  Administered 2014-02-18: 5 mL
  Filled 2014-02-18: qty 5

## 2014-02-18 MED ORDER — IDARUCIZUMAB 2.5 GM/50ML IV SOLN
5.0000 g | Freq: Once | INTRAVENOUS | Status: AC
  Administered 2014-02-18: 5 g via INTRAVENOUS
  Filled 2014-02-18: qty 1

## 2014-02-18 MED ORDER — MORPHINE SULFATE 4 MG/ML IJ SOLN
4.0000 mg | Freq: Once | INTRAMUSCULAR | Status: AC
  Administered 2014-02-18: 4 mg via INTRAVENOUS
  Filled 2014-02-18: qty 1

## 2014-02-18 MED ORDER — MEMANTINE HCL ER 21 MG PO CP24
21.0000 mg | ORAL_CAPSULE | Freq: Every day | ORAL | Status: DC
  Filled 2014-02-18: qty 1

## 2014-02-18 MED ORDER — B COMPLEX PO TABS: 1.0000 | ORAL_TABLET | Freq: Every day | ORAL | Status: DC

## 2014-02-18 NOTE — Progress Notes (Signed)
RN called for report at [id number]. Will re-attempt.

## 2014-02-18 NOTE — ED Notes (Signed)
MD made aware of low bp and pt. Is asymptomatic.

## 2014-02-18 NOTE — ED Provider Notes (Signed)
CSN: [id number]     Arrival date & time 02/18/14  0620 History   First MD Initiated Contact with Patient 02/18/14 [phone]     Chief Complaint  Patient presents with  . Fall  . Back Pain     (Consider location/radiation/quality/duration/timing/severity/associated sxs/prior Treatment) HPI  Eddie [name] is a 80 y.o. male with PMH of dementia, 3 stents placed in 2009, dementia, paroxysmal A. fib on pradaxa last dose last night presenting after a fall around 4-5am this morning. Pt lives with his wife without help in the home. Pt speaks softly but most of history from wife. Pt stood from chair took a few steps and fell to his left hitting his head on the coffee table. Pt did not get up. Pt fell two days ago hitting his back on a table. Pt with complaint of right back pain and headache today. Pt unsure of LOC. Per patient's family he has had no changes in mentation or behavior. Pt denies visual changes, slurred speech, no nausea or vomiting. No abdominal pain or weakness. No numbness tingling. No saddle anesthesia or loss of control of bladder or bowel. Pt normally ambulates without assistance.    Past Medical History  Diagnosis Date  . Diabetes mellitus without complication   . A-fib   . Dementia    Past Surgical History  Procedure Laterality Date  . Coronary stent placement  2009   History reviewed. No pertinent family history. History  Substance Use Topics  . Smoking status: Never Smoker   . Smokeless tobacco: Not on file  . Alcohol Use: Yes     Comment: occasionally     Review of Systems  Constitutional: Negative for fever and chills.  HENT: Negative for congestion and rhinorrhea.   Eyes: Negative for visual disturbance.  Respiratory: Negative for shortness of breath.   Cardiovascular: Negative for chest pain.  Gastrointestinal: Negative for nausea, vomiting and diarrhea.  Musculoskeletal: Positive for back pain.  Skin: Negative for rash.  Neurological: Negative for weakness and  headaches.      Allergies  Review of patient's allergies indicates no known allergies.  Home Medications   Prior to Admission medications   Medication Sig Start Date End Date Taking? Authorizing Provider  acetaminophen (TYLENOL) 500 MG tablet Take 500 mg by mouth every 6 (six) hours as needed for mild pain.   Yes Historical Provider, MD  aspirin EC 81 MG tablet Take 81 mg by mouth daily.   Yes Historical Provider, MD  atorvastatin (LIPITOR) 80 MG tablet Take 80 mg by mouth daily.   Yes Historical Provider, MD  b complex vitamins tablet Take 1 tablet by mouth daily.   Yes Historical Provider, MD  dabigatran (PRADAXA) 150 MG CAPS capsule Take 150 mg by mouth 2 (two) times daily.   Yes Historical Provider, MD  donepezil (ARICEPT) 10 MG tablet Take 10 mg by mouth daily.   Yes Historical Provider, MD  ezetimibe (ZETIA) 10 MG tablet Take 10 mg by mouth daily.   Yes Historical Provider, MD  fluticasone (FLONASE) 50 MCG/ACT nasal spray Place 2 sprays into both nostrils daily.   Yes Historical Provider, MD  gabapentin (NEURONTIN) 400 MG capsule Take 400 mg by mouth daily.   Yes Historical Provider, MD  magnesium oxide (MAG-OX) 400 MG tablet Take 400 mg by mouth daily.   Yes Historical Provider, MD  Memantine HCl ER (NAMENDA XR) 21 MG CP24 Take 21 mg by mouth daily.   Yes Historical Provider, MD  metroNIDAZOLE (METROGEL)  1 % gel Apply 1 application topically 2 (two) times daily as needed (rosacea). To face   Yes Historical Provider, MD  sotalol (BETAPACE) 80 MG tablet Take 40 mg by mouth 2 (two) times daily.   Yes Historical Provider, MD   BP 99/57 mmHg  Pulse 56  Temp(Src) 97.5 F (36.4 C) (Oral)  Resp 15  SpO2 97% Physical Exam  Constitutional: He appears well-developed and well-nourished. No distress.  HENT:  Head: Normocephalic.  Small left-sided frontal hematoma with small abrasion. Bleeding controled.  Eyes: Conjunctivae and EOM are normal. Pupils are equal, round, and reactive to  light. Right eye exhibits no discharge. Left eye exhibits no discharge.  Neck: Normal range of motion. Neck supple.  Cardiovascular: Normal rate, regular rhythm and normal heart sounds.   Pulmonary/Chest: Effort normal. No respiratory distress.  Decreased breath sounds on the right  Abdominal: Soft. Bowel sounds are normal. He exhibits no distension. There is no tenderness.  Musculoskeletal:  Mild midline back tenderness. No step off or crepitus. Right side and back tenderness. 4-5cm ecchymoses to right lateral back with small hematoma.   Neurological: He is alert. He exhibits normal muscle tone. Coordination normal.  Equal muscle tone. 5/5 strength in upper and lower extremities. DTR equal and intact. Negative straight leg test.   Skin: Skin is warm and dry. He is not diaphoretic.  Nursing note and vitals reviewed.   ED Course  Procedures (including critical care time) Labs Review Labs Reviewed  CBC WITH DIFFERENTIAL - Abnormal; Notable for the following:    RBC 4.20 (*)    Platelets 130 (*)    Neutrophils Relative % 81 (*)    All other components within normal limits  COMPREHENSIVE METABOLIC PANEL - Abnormal; Notable for the following:    Glucose, Bld 132 (*)    GFR calc non Af Amer 78 (*)    All other components within normal limits  URINALYSIS, ROUTINE W REFLEX MICROSCOPIC - Abnormal; Notable for the following:    Specific Gravity, Urine 1.042 (*)    Ketones, ur 15 (*)    All other components within normal limits  LACTIC ACID, PLASMA  I-STAT TROPOININ, ED    Imaging Review Dg Chest 2 View  02/18/2014   CLINICAL DATA:  Fall with right-sided chest and rib pain. Initial encounter.  EXAM: CHEST  2 VIEW  COMPARISON:  None.  FINDINGS: Multiple right-sided rib fractures. Posterior lateral seventh, eighth, ninth, tenth. There may be segmental fractures inferiorly. There is subcutaneous emphysema adjacent rib fractures. Although no gross pneumothorax is seen, this subcutaneous air  suggests pleural air.  Mild cardiomegaly with aortic atherosclerosis. Small volume right-sided pleural fluid/hemothorax. Right base airspace disease.  IMPRESSION: Multiple right-sided rib fractures with adjacent subcutaneous air. This suggests otherwise occult right-sided pneumothorax. Consider further evaluation with chest and abdominal CT.  Small volume right pleural fluid/hemothorax. Adjacent airspace disease which is likely atelectasis.   Electronically Signed   By: Abigail Miyamoto M.D.   On: 02/18/2014 07:44   Ct Head Wo Contrast  02/18/2014   CLINICAL DATA:  Memory loss, head injury after multiple falls in last several days.  EXAM: CT HEAD WITHOUT CONTRAST  CT CERVICAL SPINE WITHOUT CONTRAST  TECHNIQUE: Multidetector CT imaging of the head and cervical spine was performed following the standard protocol without intravenous contrast. Multiplanar CT image reconstructions of the cervical spine were also generated.  COMPARISON:  None.  FINDINGS: CT HEAD FINDINGS  Bony calvarium appears intact. Mild diffuse cortical atrophy is  noted. Mild chronic ischemic white matter disease is noted. No mass effect or midline shift is noted. Ventricular size is within normal limits. There is no evidence of mass lesion, hemorrhage or acute infarction.  CT CERVICAL SPINE FINDINGS  No fracture is noted. Minimal grade 1 retrolisthesis is noted at C6-7 secondary to degenerative disc disease at this level. Moderate degenerative disc disease is also noted at C4-5 and C5-6 with anterior osteophyte formation. Incidental note is made of minimal right apical pneumothorax with associated pleural effusion.  IMPRESSION: Mild diffuse cortical atrophy. Mild chronic ischemic white matter disease. No acute intracranial abnormality seen.  Multilevel degenerative disc disease is noted in the lower cervical spine. No fracture or significant spondylolisthesis is noted.  Incidental note is made of minimal right apical pneumothorax with associated  pleural effusion. Please refer to CT scan of chest of same day for further discussion.   Electronically Signed   By: Sabino Dick M.D.   On: 02/18/2014 10:07   Ct Chest W Contrast  02/18/2014   CLINICAL DATA:  Patient's following 2-3 times in the past 2 days. Bruising over the right flank.  EXAM: CT CHEST, ABDOMEN, AND PELVIS WITH CONTRAST  TECHNIQUE: Multidetector CT imaging of the chest, abdomen and pelvis was performed following the standard protocol during bolus administration of intravenous contrast.  CONTRAST:  179mL OMNIPAQUE IOHEXOL 300 MG/ML  SOLN  COMPARISON:  None.  FINDINGS: CT CHEST FINDINGS  The central airways are patent. There is a large right hemothorax. There is a small right pneumothorax measuring less than 10%. There is soft tissue emphysema in the right lateral chest wall. The left lung is clear.  There are no pathologically enlarged axillary, hilar or mediastinal lymph nodes.  The heart size is normal. There is no pericardial effusion. The thoracic aorta is normal in caliber. Extensive multivessel coronary artery atherosclerosis.  There is a 2.3 x 1.9 cm hypodense right thyroid mass.  Review of bone windows demonstrates no focal lytic or sclerotic lesions. There is a nondisplaced fracture of the right posterior eleventh rib at the costovertebral junction. There is a displaced fracture of the posterolateral tenth rib. There are displaced fractures involving the lateral and posterior seventh, eighth and ninth ribs. There is a displaced fracture of the right anterolateral sixth rib. The vertebral body heights are maintained and are in normal anatomic alignment. There are anterior bridging osteophytes of the thoracic spine as can be seen with diffuse idiopathic skeletal hyperostosis.  CT ABDOMEN AND PELVIS FINDINGS  The liver demonstrates no focal abnormality. There is no intrahepatic or extrahepatic biliary ductal dilatation. The gallbladder is normal. The spleen demonstrates no focal  abnormality.There is a 3.6 x 3.7 cm hypodense, fluid attenuating right renal mass most consistent with a cyst. The kidneys, adrenal glands and pancreas are normal. The bladder is unremarkable.  The stomach, duodenum, small intestine, and large intestine demonstrate no wall thickening or dilatation. There is no pneumoperitoneum, pneumatosis, or portal venous gas. There is no abdominal or pelvic free fluid. There is no lymphadenopathy.  The abdominal aorta is normal in caliber with atherosclerosis.  There are no lytic or sclerotic osseous lesions. There is is degenerative disc disease and facet arthropathy in the lumbar spine. Knee vertebral body heights are maintained and are in normal anatomic alignment.  IMPRESSION: 1. Large right hemothorax. Small right pneumothorax measuring less than 10 percent. Multiple displaced right rib fractures as described above. 2. No acute abdominal or pelvic injury. 3. Multivessel coronary artery atherosclerosis. 4.  2.3 x 1.9 cm hypodense right thyroid mass. If there is further clinical concern, a dedicated non a margin thyroid ultrasound is recommended for better characterization. These results were called by telephone at the time of interpretation on 02/18/2014 at 10:08 am to [facility], who verbally acknowledged these results.   Electronically Signed   By: Kathreen Devoid   On: 02/18/2014 10:11   Ct Cervical Spine Wo Contrast  02/18/2014   CLINICAL DATA:  Memory loss, head injury after multiple falls in last several days.  EXAM: CT HEAD WITHOUT CONTRAST  CT CERVICAL SPINE WITHOUT CONTRAST  TECHNIQUE: Multidetector CT imaging of the head and cervical spine was performed following the standard protocol without intravenous contrast. Multiplanar CT image reconstructions of the cervical spine were also generated.  COMPARISON:  None.  FINDINGS: CT HEAD FINDINGS  Bony calvarium appears intact. Mild diffuse cortical atrophy is noted. Mild chronic ischemic white matter disease is  noted. No mass effect or midline shift is noted. Ventricular size is within normal limits. There is no evidence of mass lesion, hemorrhage or acute infarction.  CT CERVICAL SPINE FINDINGS  No fracture is noted. Minimal grade 1 retrolisthesis is noted at C6-7 secondary to degenerative disc disease at this level. Moderate degenerative disc disease is also noted at C4-5 and C5-6 with anterior osteophyte formation. Incidental note is made of minimal right apical pneumothorax with associated pleural effusion.  IMPRESSION: Mild diffuse cortical atrophy. Mild chronic ischemic white matter disease. No acute intracranial abnormality seen.  Multilevel degenerative disc disease is noted in the lower cervical spine. No fracture or significant spondylolisthesis is noted.  Incidental note is made of minimal right apical pneumothorax with associated pleural effusion. Please refer to CT scan of chest of same day for further discussion.   Electronically Signed   By: Sabino Dick M.D.   On: 02/18/2014 10:07   Ct Abdomen Pelvis W Contrast  02/18/2014   CLINICAL DATA:  Patient's following 2-3 times in the past 2 days. Bruising over the right flank.  EXAM: CT CHEST, ABDOMEN, AND PELVIS WITH CONTRAST  TECHNIQUE: Multidetector CT imaging of the chest, abdomen and pelvis was performed following the standard protocol during bolus administration of intravenous contrast.  CONTRAST:  136mL OMNIPAQUE IOHEXOL 300 MG/ML  SOLN  COMPARISON:  None.  FINDINGS: CT CHEST FINDINGS  The central airways are patent. There is a large right hemothorax. There is a small right pneumothorax measuring less than 10%. There is soft tissue emphysema in the right lateral chest wall. The left lung is clear.  There are no pathologically enlarged axillary, hilar or mediastinal lymph nodes.  The heart size is normal. There is no pericardial effusion. The thoracic aorta is normal in caliber. Extensive multivessel coronary artery atherosclerosis.  There is a 2.3 x 1.9  cm hypodense right thyroid mass.  Review of bone windows demonstrates no focal lytic or sclerotic lesions. There is a nondisplaced fracture of the right posterior eleventh rib at the costovertebral junction. There is a displaced fracture of the posterolateral tenth rib. There are displaced fractures involving the lateral and posterior seventh, eighth and ninth ribs. There is a displaced fracture of the right anterolateral sixth rib. The vertebral body heights are maintained and are in normal anatomic alignment. There are anterior bridging osteophytes of the thoracic spine as can be seen with diffuse idiopathic skeletal hyperostosis.  CT ABDOMEN AND PELVIS FINDINGS  The liver demonstrates no focal abnormality. There is no intrahepatic or extrahepatic biliary ductal dilatation.  The gallbladder is normal. The spleen demonstrates no focal abnormality.There is a 3.6 x 3.7 cm hypodense, fluid attenuating right renal mass most consistent with a cyst. The kidneys, adrenal glands and pancreas are normal. The bladder is unremarkable.  The stomach, duodenum, small intestine, and large intestine demonstrate no wall thickening or dilatation. There is no pneumoperitoneum, pneumatosis, or portal venous gas. There is no abdominal or pelvic free fluid. There is no lymphadenopathy.  The abdominal aorta is normal in caliber with atherosclerosis.  There are no lytic or sclerotic osseous lesions. There is is degenerative disc disease and facet arthropathy in the lumbar spine. Knee vertebral body heights are maintained and are in normal anatomic alignment.  IMPRESSION: 1. Large right hemothorax. Small right pneumothorax measuring less than 10 percent. Multiple displaced right rib fractures as described above. 2. No acute abdominal or pelvic injury. 3. Multivessel coronary artery atherosclerosis. 4. 2.3 x 1.9 cm hypodense right thyroid mass. If there is further clinical concern, a dedicated non a margin thyroid ultrasound is recommended  for better characterization. These results were called by telephone at the time of interpretation on 02/18/2014 at 10:08 am to [facility], who verbally acknowledged these results.   Electronically Signed   By: Kathreen Devoid   On: 02/18/2014 10:11     EKG Interpretation None      MDM   Final diagnoses:  Fall  Rib fracture  Subcutaneous air, initial encounter  Head injury, initial encounter   Pt on pradaxa for atrial fibrillation presenting after a fall this morning. Patient with right-sided back pain. Patient hemodynamically stable. Pt found to have multiple rib fractures on the right side and large hemothorax with small pneumothorax. No head trauma or bleed. Patient's pain controlled in ED. Trauma surgery consult. Spoke with Dr. Hulen Skains who recommend chest tube placement and admission to the hospital. Consult internal medicine unassigned. Spoke with Karlyn Agee who agrees to evaluate and admit the patient to Dr. Zenovia Jarred service with a telemetry bed.  Discussed all results and patient verbalizes understanding and agrees with plan.  This is a shared patient. This patient was discussed with the physician, Dr. Mingo Amber who saw and evaluated the patient and agrees with the plan.     Pura Spice, PA-C 02/18/14 [place], MD 02/18/14 [phone]

## 2014-02-18 NOTE — Plan of Care (Signed)
Problem: Phase I Progression Outcomes Goal: Initial discharge plan identified Outcome: Progressing     

## 2014-02-18 NOTE — Progress Notes (Signed)
Pharmacy Consult  Pt admitted s/p fall, complains of R flank pain and headache.  Found to have multiple rib fractures, large R hemothorax and R pneumothorax. Pt is on Pradaxa 150 mg po BID prior to admission, last dose was the evening of 12/1. Trauma team wants to reverse anticoagulation due to internal bleeding.   Plan -Praxbind 5 g IV x1 -I have instructed the RN to administer as two separate doses of 2.5 g -F/u long term plans for anticoagulation   [name] Better, PharmD, BCPS Clinical Pharmacist Pager: [phone] 02/18/2014 12:55 PM

## 2014-02-18 NOTE — H&P (Signed)
Date: 02/18/2014               Patient Name:  Eddie [name] MRN: [id number]  DOB: June 10, 1932 Age / Sex: 80 y.o., male   PCP: No Pcp Per Patient         Medical Service: Internal Medicine Teaching Service         Attending Physician: Dr. Bartholomew Crews, MD    First Contact: Dr. Sherrine Maples  Pager: [phone]  Second Contact: Dr. Gordy Levan  Pager: [phone]       After Hours (After 5p/  First Contact Pager: [phone]  weekends / holidays): Second Contact Pager: [phone]   Chief Complaint: Traumatic fall on Pradaxa   History of Present Illness:  80 y.o PMH DM 2, Afib on Pradaxa, HTN, CAD s/p stents x3, CAS, sciatica, dementia (dx'ed 1-2.5 years ago though declining for the last year).  History from wife as patient has dementia and baseline is oriented to wife, sometimes to self and niece [facility]) and friends, not time or year or weekday.  Wife states Monday the patient fell and hit his back on the table and felt sore yesterday.  This am around 4-5 am patient was getting up from a chair lost his balance and fell and hit his head and fell on his right side and hit the coffee table.  Wife denies that fall mechanism was from tripping over object.  BP in ED initially 172/71 then 99/57. He was given Fentanyl 100 x 1, Norco/Vicodin 2 tablets, Idarucizumab 5 mg, Morphine 4 mg x 1, NS 500 cc bolus.  CT imaging obtained.  Trauma consulted rec. Medicine admit and will follow.    Meds: Current Facility-Administered Medications  Medication Dose Route Frequency Provider Last Rate Last Dose  . 0.9 %  sodium chloride infusion   Intravenous Continuous Cresenciano Genre, MD 100 mL/hr at 02/18/14 1325    . atorvastatin (LIPITOR) tablet 80 mg  80 mg Oral q1800 Cresenciano Genre, MD      . Derrill Memo ON 02/19/2014] B-complex with vitamin C tablet 1 tablet  1 tablet Oral Daily Eddie Crews, MD      . donepezil (ARICEPT) tablet 10 mg  10 mg Oral Daily Cresenciano Genre, MD      . ezetimibe (ZETIA) tablet 10 mg  10 mg Oral Daily  Cresenciano Genre, MD      . fluticasone [facility]) 50 MCG/ACT nasal spray 2 spray  2 spray Each Nare Daily Cresenciano Genre, MD      . gabapentin (NEURONTIN) capsule 400 mg  400 mg Oral Daily Cresenciano Genre, MD      . HYDROcodone-acetaminophen (NORCO/VICODIN) 5-325 MG per tablet 1-2 tablet  1-2 tablet Oral Q4H PRN Cresenciano Genre, MD      . insulin aspart (novoLOG) injection 0-9 Units  0-9 Units Subcutaneous TID WC Cresenciano Genre, MD      . magnesium oxide (MAG-OX) tablet 400 mg  400 mg Oral Daily Cresenciano Genre, MD      . Memantine HCl ER CP24 21 mg  21 mg Oral Q1400 Eddie Crews, MD      . sodium chloride 0.9 % injection 3 mL  3 mL Intravenous Q12H Cresenciano Genre, MD      . sotalol (BETAPACE) tablet 40 mg  40 mg Oral BID Cresenciano Genre, MD       Medications Prior to Admission  Medication Sig Dispense Refill  . acetaminophen (TYLENOL) 500  MG tablet Take 500 mg by mouth every 6 (six) hours as needed for mild pain.    Marland Kitchen aspirin EC 81 MG tablet Take 81 mg by mouth daily.    Marland Kitchen atorvastatin (LIPITOR) 80 MG tablet Take 80 mg by mouth daily.    Marland Kitchen b complex vitamins tablet Take 1 tablet by mouth daily.    . dabigatran (PRADAXA) 150 MG CAPS capsule Take 150 mg by mouth 2 (two) times daily.    Marland Kitchen donepezil (ARICEPT) 10 MG tablet Take 10 mg by mouth daily.    Marland Kitchen ezetimibe (ZETIA) 10 MG tablet Take 10 mg by mouth daily.    . fluticasone (FLONASE) 50 MCG/ACT nasal spray Place 2 sprays into both nostrils daily.    Marland Kitchen gabapentin (NEURONTIN) 400 MG capsule Take 400 mg by mouth daily.    . magnesium oxide (MAG-OX) 400 MG tablet Take 400 mg by mouth daily.    . Memantine HCl ER (NAMENDA XR) 21 MG CP24 Take 21 mg by mouth daily.    . metroNIDAZOLE (METROGEL) 1 % gel Apply 1 application topically 2 (two) times daily as needed (rosacea). To face    . sotalol (BETAPACE) 80 MG tablet Take 40 mg by mouth 2 (two) times daily.     Allergies: Allergies as of 02/18/2014  . (No Known Allergies)   Past Medical  History  Diagnosis Date  . Diabetes mellitus without complication   . A-fib   . Dementia   . HTN (hypertension)   . CAD (coronary artery disease)     s/p PCI x 3 (09/2007)  . Carotid artery stenosis     left  . Sciatica    Past Surgical History  Procedure Laterality Date  . Coronary stent placement  2009   History reviewed. No pertinent family history. History   Social History  . Marital Status: Married    Spouse Name: N/A    Number of Children: N/A  . Years of Education: N/A   Occupational History  . Not on file.   Social History Main Topics  . Smoking status: Never Smoker   . Smokeless tobacco: Not on file  . Alcohol Use: Yes     Comment: occasionally   . Drug Use: Not on file  . Sexual Activity: Not on file   Other Topics Concern  . Not on file   Social History Narrative   Married   No children but nieces and nephews and friends    Recently moved to [facility] 01/2014 to be closer to above    Denies h/o smoking, caffeine    Lives at home with wife at [employer]    No PCP possibly interested in Dr. Hartley Barefoot.     Review of Systems: Obtained from wife but she states patient normally answers "no" when asked and does not complain so it is hard to tell if he is bothered by anything.   General: denies fever/chills HEENT: denies h/a Cardiac: denies chest pain, denies palpitation s Pulm: denies sob Abd: denies n/v/ab pain Ext: denies swelling Neuro: denies dizziness/lightheadedness, denies syncope   Physical Exam: Blood pressure 113/59, pulse 57, temperature 97.5 F (36.4 C), temperature source Oral, resp. rate 13, SpO2 96 %. Vitals reviewed. General: resting in bed, NAD HEENT: [place]/at, perrl b/l, no scleral icterus, left forehead with ecchymosis/abrasion Cardiac: bradycardic, +systolic murmur, no gallops Pulm: decreased breath sound right, ctab left  Abd/GU: soft, tender to palpation over rib fractures right, ecchymoses right flank and gluteal  region, nondistended, BS present Ext: warm and well perfused, no pedal edema Neuro: alert and oriented to person, wife, not year, CN 2-12 grossly intact, moving all 4 extremities.   Lab results: Basic Metabolic Panel:  Recent Labs  02/18/14 0723  NA 143  K 5.1  CL 106  CO2 27  GLUCOSE 132*  BUN 20  CREATININE 0.92  CALCIUM 9.0   Liver Function Tests:  Recent Labs  02/18/14 0723  AST 30  ALT 25  ALKPHOS 63  BILITOT 0.9  PROT 6.4  ALBUMIN 3.7   CBC:  Recent Labs  02/18/14 0723  WBC 9.2  NEUTROABS 7.4  HGB 13.0  HCT 39.2  MCV 93.3  PLT 130*   Cardiac Enzymes: No results for input(s): CKTOTAL, CKMB, CKMBINDEX, TROPONINI in the last 72 hours.  CBG: No results for input(s): GLUCAP in the last 72 hours. Hemoglobin A1C: No results for input(s): HGBA1C in the last 72 hours.  Thyroid Function Tests: No results for input(s): TSH, T4TOTAL, FREET4, T3FREE, THYROIDAB in the last 72 hours.  Urinalysis:  Recent Labs  02/18/14 1012  COLORURINE YELLOW  LABSPEC 1.042*  PHURINE 5.0  GLUCOSEU NEGATIVE  HGBUR NEGATIVE  BILIRUBINUR NEGATIVE  KETONESUR 15*  PROTEINUR NEGATIVE  UROBILINOGEN 0.2  NITRITE NEGATIVE  LEUKOCYTESUR NEGATIVE   Misc. Labs: Trop x 1  HA1C  TSH  Imaging results:  Dg Chest 2 View  02/18/2014   CLINICAL DATA:  Fall with right-sided chest and rib pain. Initial encounter.  EXAM: CHEST  2 VIEW  COMPARISON:  None.  FINDINGS: Multiple right-sided rib fractures. Posterior lateral seventh, eighth, ninth, tenth. There may be segmental fractures inferiorly. There is subcutaneous emphysema adjacent rib fractures. Although no gross pneumothorax is seen, this subcutaneous air suggests pleural air.  Mild cardiomegaly with aortic atherosclerosis. Small volume right-sided pleural fluid/hemothorax. Right base airspace disease.  IMPRESSION: Multiple right-sided rib fractures with adjacent subcutaneous air. This suggests otherwise occult right-sided  pneumothorax. Consider further evaluation with chest and abdominal CT.  Small volume right pleural fluid/hemothorax. Adjacent airspace disease which is likely atelectasis.   Electronically Signed   By: Abigail Miyamoto M.D.   On: 02/18/2014 07:44   Ct Head Wo Contrast  02/18/2014   CLINICAL DATA:  Memory loss, head injury after multiple falls in last several days.  EXAM: CT HEAD WITHOUT CONTRAST  CT CERVICAL SPINE WITHOUT CONTRAST  TECHNIQUE: Multidetector CT imaging of the head and cervical spine was performed following the standard protocol without intravenous contrast. Multiplanar CT image reconstructions of the cervical spine were also generated.  COMPARISON:  None.  FINDINGS: CT HEAD FINDINGS  Bony calvarium appears intact. Mild diffuse cortical atrophy is noted. Mild chronic ischemic white matter disease is noted. No mass effect or midline shift is noted. Ventricular size is within normal limits. There is no evidence of mass lesion, hemorrhage or acute infarction.  CT CERVICAL SPINE FINDINGS  No fracture is noted. Minimal grade 1 retrolisthesis is noted at C6-7 secondary to degenerative disc disease at this level. Moderate degenerative disc disease is also noted at C4-5 and C5-6 with anterior osteophyte formation. Incidental note is made of minimal right apical pneumothorax with associated pleural effusion.  IMPRESSION: Mild diffuse cortical atrophy. Mild chronic ischemic white matter disease. No acute intracranial abnormality seen.  Multilevel degenerative disc disease is noted in the lower cervical spine. No fracture or significant spondylolisthesis is noted.  Incidental note is made of minimal right apical pneumothorax with associated pleural effusion. Please refer to  CT scan of chest of same day for further discussion.   Electronically Signed   By: Sabino Dick M.D.   On: 02/18/2014 10:07   Ct Chest W Contrast  02/18/2014   CLINICAL DATA:  Patient's following 2-3 times in the past 2 days. Bruising over  the right flank.  EXAM: CT CHEST, ABDOMEN, AND PELVIS WITH CONTRAST  TECHNIQUE: Multidetector CT imaging of the chest, abdomen and pelvis was performed following the standard protocol during bolus administration of intravenous contrast.  CONTRAST:  177mL OMNIPAQUE IOHEXOL 300 MG/ML  SOLN  COMPARISON:  None.  FINDINGS: CT CHEST FINDINGS  The central airways are patent. There is a large right hemothorax. There is a small right pneumothorax measuring less than 10%. There is soft tissue emphysema in the right lateral chest wall. The left lung is clear.  There are no pathologically enlarged axillary, hilar or mediastinal lymph nodes.  The heart size is normal. There is no pericardial effusion. The thoracic aorta is normal in caliber. Extensive multivessel coronary artery atherosclerosis.  There is a 2.3 x 1.9 cm hypodense right thyroid mass.  Review of bone windows demonstrates no focal lytic or sclerotic lesions. There is a nondisplaced fracture of the right posterior eleventh rib at the costovertebral junction. There is a displaced fracture of the posterolateral tenth rib. There are displaced fractures involving the lateral and posterior seventh, eighth and ninth ribs. There is a displaced fracture of the right anterolateral sixth rib. The vertebral body heights are maintained and are in normal anatomic alignment. There are anterior bridging osteophytes of the thoracic spine as can be seen with diffuse idiopathic skeletal hyperostosis.  CT ABDOMEN AND PELVIS FINDINGS  The liver demonstrates no focal abnormality. There is no intrahepatic or extrahepatic biliary ductal dilatation. The gallbladder is normal. The spleen demonstrates no focal abnormality.There is a 3.6 x 3.7 cm hypodense, fluid attenuating right renal mass most consistent with a cyst. The kidneys, adrenal glands and pancreas are normal. The bladder is unremarkable.  The stomach, duodenum, small intestine, and large intestine demonstrate no wall thickening  or dilatation. There is no pneumoperitoneum, pneumatosis, or portal venous gas. There is no abdominal or pelvic free fluid. There is no lymphadenopathy.  The abdominal aorta is normal in caliber with atherosclerosis.  There are no lytic or sclerotic osseous lesions. There is is degenerative disc disease and facet arthropathy in the lumbar spine. Knee vertebral body heights are maintained and are in normal anatomic alignment.  IMPRESSION: 1. Large right hemothorax. Small right pneumothorax measuring less than 10 percent. Multiple displaced right rib fractures as described above. 2. No acute abdominal or pelvic injury. 3. Multivessel coronary artery atherosclerosis. 4. 2.3 x 1.9 cm hypodense right thyroid mass. If there is further clinical concern, a dedicated non a margin thyroid ultrasound is recommended for better characterization. These results were called by telephone at the time of interpretation on 02/18/2014 at 10:08 am to [facility], [facility], who verbally acknowledged these results.   Electronically Signed   By: Kathreen Devoid   On: 02/18/2014 10:11   Ct Cervical Spine Wo Contrast  02/18/2014   CLINICAL DATA:  Memory loss, head injury after multiple falls in last several days.  EXAM: CT HEAD WITHOUT CONTRAST  CT CERVICAL SPINE WITHOUT CONTRAST  TECHNIQUE: Multidetector CT imaging of the head and cervical spine was performed following the standard protocol without intravenous contrast. Multiplanar CT image reconstructions of the cervical spine were also generated.  COMPARISON:  None.  FINDINGS: CT  HEAD FINDINGS  Bony calvarium appears intact. Mild diffuse cortical atrophy is noted. Mild chronic ischemic white matter disease is noted. No mass effect or midline shift is noted. Ventricular size is within normal limits. There is no evidence of mass lesion, hemorrhage or acute infarction.  CT CERVICAL SPINE FINDINGS  No fracture is noted. Minimal grade 1 retrolisthesis is noted at C6-7 secondary to degenerative  disc disease at this level. Moderate degenerative disc disease is also noted at C4-5 and C5-6 with anterior osteophyte formation. Incidental note is made of minimal right apical pneumothorax with associated pleural effusion.  IMPRESSION: Mild diffuse cortical atrophy. Mild chronic ischemic white matter disease. No acute intracranial abnormality seen.  Multilevel degenerative disc disease is noted in the lower cervical spine. No fracture or significant spondylolisthesis is noted.  Incidental note is made of minimal right apical pneumothorax with associated pleural effusion. Please refer to CT scan of chest of same day for further discussion.   Electronically Signed   By: Sabino Dick M.D.   On: 02/18/2014 10:07   Ct Abdomen Pelvis W Contrast  02/18/2014   CLINICAL DATA:  Patient's following 2-3 times in the past 2 days. Bruising over the right flank.  EXAM: CT CHEST, ABDOMEN, AND PELVIS WITH CONTRAST  TECHNIQUE: Multidetector CT imaging of the chest, abdomen and pelvis was performed following the standard protocol during bolus administration of intravenous contrast.  CONTRAST:  11mL OMNIPAQUE IOHEXOL 300 MG/ML  SOLN  COMPARISON:  None.  FINDINGS: CT CHEST FINDINGS  The central airways are patent. There is a large right hemothorax. There is a small right pneumothorax measuring less than 10%. There is soft tissue emphysema in the right lateral chest wall. The left lung is clear.  There are no pathologically enlarged axillary, hilar or mediastinal lymph nodes.  The heart size is normal. There is no pericardial effusion. The thoracic aorta is normal in caliber. Extensive multivessel coronary artery atherosclerosis.  There is a 2.3 x 1.9 cm hypodense right thyroid mass.  Review of bone windows demonstrates no focal lytic or sclerotic lesions. There is a nondisplaced fracture of the right posterior eleventh rib at the costovertebral junction. There is a displaced fracture of the posterolateral tenth rib. There are  displaced fractures involving the lateral and posterior seventh, eighth and ninth ribs. There is a displaced fracture of the right anterolateral sixth rib. The vertebral body heights are maintained and are in normal anatomic alignment. There are anterior bridging osteophytes of the thoracic spine as can be seen with diffuse idiopathic skeletal hyperostosis.  CT ABDOMEN AND PELVIS FINDINGS  The liver demonstrates no focal abnormality. There is no intrahepatic or extrahepatic biliary ductal dilatation. The gallbladder is normal. The spleen demonstrates no focal abnormality.There is a 3.6 x 3.7 cm hypodense, fluid attenuating right renal mass most consistent with a cyst. The kidneys, adrenal glands and pancreas are normal. The bladder is unremarkable.  The stomach, duodenum, small intestine, and large intestine demonstrate no wall thickening or dilatation. There is no pneumoperitoneum, pneumatosis, or portal venous gas. There is no abdominal or pelvic free fluid. There is no lymphadenopathy.  The abdominal aorta is normal in caliber with atherosclerosis.  There are no lytic or sclerotic osseous lesions. There is is degenerative disc disease and facet arthropathy in the lumbar spine. Knee vertebral body heights are maintained and are in normal anatomic alignment.  IMPRESSION: 1. Large right hemothorax. Small right pneumothorax measuring less than 10 percent. Multiple displaced right rib fractures as described above.  2. No acute abdominal or pelvic injury. 3. Multivessel coronary artery atherosclerosis. 4. 2.3 x 1.9 cm hypodense right thyroid mass. If there is further clinical concern, a dedicated non a margin thyroid ultrasound is recommended for better characterization. These results were called by telephone at the time of interpretation on 02/18/2014 at 10:08 am to [facility], [facility], who verbally acknowledged these results.   Electronically Signed   By: Kathreen Devoid   On: 02/18/2014 10:11   Dg Chest Port 1  View  02/18/2014   CLINICAL DATA:  Status post fall. Hemopneumothorax on the right. Chest tube placement.  EXAM: PORTABLE CHEST - 1 VIEW  COMPARISON:  CT chest and PA and lateral chest earlier this same day.  FINDINGS: A new right chest tube is in place. Pleural fluid on the right appears decreased. No pneumothorax is identified. Right basilar atelectasis is noted. Multiple right rib fractures are again seen. The left lung is expanded with mild basilar atelectasis noted. Heart size is normal.  IMPRESSION: Some decrease in right pleural fluid after chest tube placement. Negative for pneumothorax.  Right worse than left basilar atelectasis.  Multiple right rib fractures.   Electronically Signed   By: Inge Rise M.D.   On: 02/18/2014 13:08    Other results: EKG: SB HR 50, nl axis, sinus brady, no ST/T changes no LVH, T wave inv AVL, V 2   Assessment & Plan by Problem: 80 y.o with traumatic fall with hemothorax, pneumothorax and rib fractures.   #Large Hemothorax with small <10% pneumothorax on the right, traumatic -Trauma consulted placed chest tube will follow, appreciate recs  -will check CBC again at 2 PM to see trend -repeat CBC if am or sooner if trending down  -incentive spirometry  -will need repeat imaging of chest for improvement or sooner if declining  -prn O2  -Norco/Vicodin 1-2 tablets q4 prn   #Rib fractures, multiple -incentive spirometry and pain control -Encourage mobilization with assistance   #Abnormality of gait  -consider PT/OT when able  -will check orthostatics and record   #Atrial fibrillation -Holding Pradaxa and Aspirin 81.  Bradycardic currently.  -Given Prixbind -monitor via tele, continue Sotalol 40 mg bid  -pharm to confirm dose of home meds -low threshold to call cardiology if pt symptomatic   #Hypotension -given 500 cc NS in ED, NS 100 cc/hr, then BP sbp 70s and gave 1 L NS -monitor BP closely  #DM type 2 (diabetes mellitus, type 2) -monitor  cbgs, SSI-S -pending HA1C   #Thrombocytopenia -will trend CBC  #incidental thyroid mass -2.3 x 1.9 cm right thyroid mass  -If further concern [place] recommended.  Can be done outpatient -will check tsh   #Dementia -Memantine ER 21 mg, Aricept 10 mg continued -per EMS tendency to be aggressive though wife declines this, may need prn Haldol low dose +/- sitter   #Dyslipidemia/CAD/Carotid artery stenosis -continued Lipitor 80 mg, Zetia -hold aspirin for now  #F/E/N -NS 100 cc x 1 day -BMET, Mag in am -H/H diet   #DVT px  -hold anticoagulation, scds only    Code status disc'ed with wife who states she and her husband previously discussed DNR/I and were in the process of getting a lawyer to put it in writing.   Dispo: Disposition is deferred at this time, awaiting improvement of current medical problems. Anticipated discharge in approximately 2-4 day(s).   The patient does not have a current PCP (No Pcp Per Patient) and does need an [facility] hospital follow-up  appointment after discharge either with [facility] or another provider.  Recently located to the area from [place].   The patient does not have transportation limitations that hinder transportation to clinic appointments.  Signed: Cresenciano Genre, MD [id number] 02/18/2014, 2:31 PM

## 2014-02-18 NOTE — ED Notes (Signed)
Patient transported to CT 

## 2014-02-18 NOTE — ED Notes (Signed)
PA at bedside.

## 2014-02-18 NOTE — H&P (Signed)
Eddie [name] is an 80 y.o. male.   Chief Complaint: Fall HPI: Eddie [name] came in s/p fall. He fell once on Monday evening and then again this morning. Both times he had been sleeping then got up and started moving before he got his bearings. There was no e/o presyncope or syncope and these seemed to be mechanical falls. He did not seem especially bothered by the first fall and did not seek care but was in significant pain after this morning's fall. He has rather advanced Alzheimer's and has limited communication. He is on Pradaxa for paroxysmal afib.  Past Medical History  Diagnosis Date  . Diabetes mellitus without complication   . A-fib   . Dementia     Past Surgical History  Procedure Laterality Date  . Coronary stent placement  2009    History reviewed. No pertinent family history. Social History:  reports that he has never smoked. He does not have any smokeless tobacco history on file. He reports that he drinks alcohol. His drug history is not on file.  Allergies: No Known Allergies  Results for orders placed or performed during the hospital encounter of 02/18/14 (from the past 48 hour(s))  CBC with Differential     Status: Abnormal   Collection Time: 02/18/14  7:23 AM  Result Value Ref Range   WBC 9.2 4.0 - 10.5 K/uL   RBC 4.20 (L) 4.22 - 5.81 MIL/uL   Hemoglobin 13.0 13.0 - 17.0 g/dL   HCT 39.2 39.0 - 52.0 %   MCV 93.3 78.0 - 100.0 fL   MCH 31.0 26.0 - 34.0 pg   MCHC 33.2 30.0 - 36.0 g/dL   RDW 13.9 11.5 - 15.5 %   Platelets 130 (L) 150 - 400 K/uL   Neutrophils Relative % 81 (H) 43 - 77 %   Neutro Abs 7.4 1.7 - 7.7 K/uL   Lymphocytes Relative 12 12 - 46 %   Lymphs Abs 1.1 0.7 - 4.0 K/uL   Monocytes Relative 6 3 - 12 %   Monocytes Absolute 0.5 0.1 - 1.0 K/uL   Eosinophils Relative 1 0 - 5 %   Eosinophils Absolute 0.1 0.0 - 0.7 K/uL   Basophils Relative 0 0 - 1 %   Basophils Absolute 0.0 0.0 - 0.1 K/uL  Comprehensive metabolic panel     Status: Abnormal   Collection  Time: 02/18/14  7:23 AM  Result Value Ref Range   Sodium 143 137 - 147 mEq/L   Potassium 5.1 3.7 - 5.3 mEq/L   Chloride 106 96 - 112 mEq/L   CO2 27 19 - 32 mEq/L   Glucose, Bld 132 (H) 70 - 99 mg/dL   BUN 20 6 - 23 mg/dL   Creatinine, Ser 0.92 0.50 - 1.35 mg/dL   Calcium 9.0 8.4 - 10.5 mg/dL   Total Protein 6.4 6.0 - 8.3 g/dL   Albumin 3.7 3.5 - 5.2 g/dL   AST 30 0 - 37 U/L   ALT 25 0 - 53 U/L   Alkaline Phosphatase 63 39 - 117 U/L   Total Bilirubin 0.9 0.3 - 1.2 mg/dL   GFR calc non Af Amer 78 (L) >90 mL/min   GFR calc Af Amer >90 >90 mL/min    Comment: (NOTE) The eGFR has been calculated using the CKD EPI equation. This calculation has not been validated in all clinical situations. eGFR's persistently <90 mL/min signify possible Chronic Kidney Disease.    Anion gap 10 5 - 15  Lactic acid,  plasma     Status: None   Collection Time: 02/18/14  7:23 AM  Result Value Ref Range   Lactic Acid, Venous 0.7 0.5 - 2.2 mmol/L  I-Stat Troponin, ED (not at [facility])     Status: None   Collection Time: 02/18/14  7:34 AM  Result Value Ref Range   Troponin i, poc 0.00 0.00 - 0.08 ng/mL   Comment 3            Comment: Due to the release kinetics of cTnI, a negative result within the first hours of the onset of symptoms does not rule out myocardial infarction with certainty. If myocardial infarction is still suspected, repeat the test at appropriate intervals.   Urinalysis, Routine w reflex microscopic     Status: Abnormal   Collection Time: 02/18/14 10:12 AM  Result Value Ref Range   Color, Urine YELLOW YELLOW   APPearance CLEAR CLEAR   Specific Gravity, Urine 1.042 (H) 1.005 - 1.030   pH 5.0 5.0 - 8.0   Glucose, UA NEGATIVE NEGATIVE mg/dL   Hgb urine dipstick NEGATIVE NEGATIVE   Bilirubin Urine NEGATIVE NEGATIVE   Ketones, ur 15 (A) NEGATIVE mg/dL   Protein, ur NEGATIVE NEGATIVE mg/dL   Urobilinogen, UA 0.2 0.0 - 1.0 mg/dL   Nitrite NEGATIVE NEGATIVE   Leukocytes, UA NEGATIVE  NEGATIVE    Comment: MICROSCOPIC NOT DONE ON URINES WITH NEGATIVE PROTEIN, BLOOD, LEUKOCYTES, NITRITE, OR GLUCOSE <1000 mg/dL.   Dg Chest 2 View  02/18/2014   CLINICAL DATA:  Fall with right-sided chest and rib pain. Initial encounter.  EXAM: CHEST  2 VIEW  COMPARISON:  None.  FINDINGS: Multiple right-sided rib fractures. Posterior lateral seventh, eighth, ninth, tenth. There may be segmental fractures inferiorly. There is subcutaneous emphysema adjacent rib fractures. Although no gross pneumothorax is seen, this subcutaneous air suggests pleural air.  Mild cardiomegaly with aortic atherosclerosis. Small volume right-sided pleural fluid/hemothorax. Right base airspace disease.  IMPRESSION: Multiple right-sided rib fractures with adjacent subcutaneous air. This suggests otherwise occult right-sided pneumothorax. Consider further evaluation with chest and abdominal CT.  Small volume right pleural fluid/hemothorax. Adjacent airspace disease which is likely atelectasis.   Electronically Signed   By: Abigail Miyamoto M.D.   On: 02/18/2014 07:44   Ct Head Wo Contrast  02/18/2014   CLINICAL DATA:  Memory loss, head injury after multiple falls in last several days.  EXAM: CT HEAD WITHOUT CONTRAST  CT CERVICAL SPINE WITHOUT CONTRAST  TECHNIQUE: Multidetector CT imaging of the head and cervical spine was performed following the standard protocol without intravenous contrast. Multiplanar CT image reconstructions of the cervical spine were also generated.  COMPARISON:  None.  FINDINGS: CT HEAD FINDINGS  Bony calvarium appears intact. Mild diffuse cortical atrophy is noted. Mild chronic ischemic white matter disease is noted. No mass effect or midline shift is noted. Ventricular size is within normal limits. There is no evidence of mass lesion, hemorrhage or acute infarction.  CT CERVICAL SPINE FINDINGS  No fracture is noted. Minimal grade 1 retrolisthesis is noted at C6-7 secondary to degenerative disc disease at this level.  Moderate degenerative disc disease is also noted at C4-5 and C5-6 with anterior osteophyte formation. Incidental note is made of minimal right apical pneumothorax with associated pleural effusion.  IMPRESSION: Mild diffuse cortical atrophy. Mild chronic ischemic white matter disease. No acute intracranial abnormality seen.  Multilevel degenerative disc disease is noted in the lower cervical spine. No fracture or significant spondylolisthesis is noted.  Incidental note  is made of minimal right apical pneumothorax with associated pleural effusion. Please refer to CT scan of chest of same day for further discussion.   Electronically Signed   By: Sabino Dick M.D.   On: 02/18/2014 10:07   Ct Chest W Contrast  02/18/2014   CLINICAL DATA:  Patient's following 2-3 times in the past 2 days. Bruising over the right flank.  EXAM: CT CHEST, ABDOMEN, AND PELVIS WITH CONTRAST  TECHNIQUE: Multidetector CT imaging of the chest, abdomen and pelvis was performed following the standard protocol during bolus administration of intravenous contrast.  CONTRAST:  124m OMNIPAQUE IOHEXOL 300 MG/ML  SOLN  COMPARISON:  None.  FINDINGS: CT CHEST FINDINGS  The central airways are patent. There is a large right hemothorax. There is a small right pneumothorax measuring less than 10%. There is soft tissue emphysema in the right lateral chest wall. The left lung is clear.  There are no pathologically enlarged axillary, hilar or mediastinal lymph nodes.  The heart size is normal. There is no pericardial effusion. The thoracic aorta is normal in caliber. Extensive multivessel coronary artery atherosclerosis.  There is a 2.3 x 1.9 cm hypodense right thyroid mass.  Review of bone windows demonstrates no focal lytic or sclerotic lesions. There is a nondisplaced fracture of the right posterior eleventh rib at the costovertebral junction. There is a displaced fracture of the posterolateral tenth rib. There are displaced fractures involving the lateral  and posterior seventh, eighth and ninth ribs. There is a displaced fracture of the right anterolateral sixth rib. The vertebral body heights are maintained and are in normal anatomic alignment. There are anterior bridging osteophytes of the thoracic spine as can be seen with diffuse idiopathic skeletal hyperostosis.  CT ABDOMEN AND PELVIS FINDINGS  The liver demonstrates no focal abnormality. There is no intrahepatic or extrahepatic biliary ductal dilatation. The gallbladder is normal. The spleen demonstrates no focal abnormality.There is a 3.6 x 3.7 cm hypodense, fluid attenuating right renal mass most consistent with a cyst. The kidneys, adrenal glands and pancreas are normal. The bladder is unremarkable.  The stomach, duodenum, small intestine, and large intestine demonstrate no wall thickening or dilatation. There is no pneumoperitoneum, pneumatosis, or portal venous gas. There is no abdominal or pelvic free fluid. There is no lymphadenopathy.  The abdominal aorta is normal in caliber with atherosclerosis.  There are no lytic or sclerotic osseous lesions. There is is degenerative disc disease and facet arthropathy in the lumbar spine. Knee vertebral body heights are maintained and are in normal anatomic alignment.  IMPRESSION: 1. Large right hemothorax. Small right pneumothorax measuring less than 10 percent. Multiple displaced right rib fractures as described above. 2. No acute abdominal or pelvic injury. 3. Multivessel coronary artery atherosclerosis. 4. 2.3 x 1.9 cm hypodense right thyroid mass. If there is further clinical concern, a dedicated non a margin thyroid ultrasound is recommended for better characterization. These results were called by telephone at the time of interpretation on 02/18/2014 at 10:08 am to [facility], who verbally acknowledged these results.   Electronically Signed   By: HKathreen Devoid  On: 02/18/2014 10:11    Review of Systems  Unable to perform ROS: mental acuity     Blood pressure 99/57, pulse 56, temperature 97.5 F (36.4 C), temperature source Oral, resp. rate 15, SpO2 97 %. Physical Exam  Vitals reviewed. Constitutional: He appears well-developed and well-nourished. He is cooperative. No distress. Cervical collar and nasal cannula in place.  HENT:  Head: Normocephalic and atraumatic. Head is without raccoon's eyes, without Battle's sign, without abrasion, without contusion and without laceration.  Right Ear: Hearing, tympanic membrane, external ear and ear canal normal. No lacerations. No drainage or tenderness. No foreign bodies. Tympanic membrane is not perforated. No hemotympanum.  Left Ear: Hearing, tympanic membrane, external ear and ear canal normal. No lacerations. No drainage or tenderness. No foreign bodies. Tympanic membrane is not perforated. No hemotympanum.  Nose: Nose normal. No nose lacerations, sinus tenderness, nasal deformity or nasal septal hematoma. No epistaxis.  Mouth/Throat: Uvula is midline, oropharynx is clear and moist and mucous membranes are normal. No lacerations. No oropharyngeal exudate.  Eyes: Conjunctivae, EOM and lids are normal. Pupils are equal, round, and reactive to light. Right eye exhibits no discharge. Left eye exhibits no discharge. No scleral icterus.  Neck: Trachea normal and normal range of motion. Neck supple. No JVD present. No spinous process tenderness and no muscular tenderness present. Carotid bruit is not present. No tracheal deviation present. No thyromegaly present.  Cardiovascular: Regular rhythm, intact distal pulses and normal pulses.  Bradycardia present.   Murmur heard.  Systolic murmur is present with a grade of 3/6  Respiratory: Effort normal. No stridor. No respiratory distress. He has decreased breath sounds in the right upper field, the right middle field and the right lower field. He exhibits no tenderness, no bony tenderness, no laceration and no crepitus.  GI: Soft. Normal appearance.  He exhibits no distension. Bowel sounds are decreased. There is no tenderness. There is no rigidity, no rebound, no guarding and no CVA tenderness.  Musculoskeletal: Normal range of motion. He exhibits no edema or tenderness.       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  Lymphadenopathy:    He has no cervical adenopathy.  Neurological: He is alert. He has normal strength. No cranial nerve deficit or sensory deficit. GCS eye subscore is 4. GCS verbal subscore is 5. GCS motor subscore is 6.  Skin: Skin is warm, dry and intact. He is not diaphoretic.  Psychiatric: He has a normal mood and affect. His speech is normal and behavior is normal.     Assessment/Plan Fall Multiple right rib fxs w/HPTX Multiple medical problems  Will place right chest tube, admit for management    Lisette Abu, PA-C Pager: [phone] General Trauma PA Pager: [phone] 02/18/2014, 10:57 AM

## 2014-02-18 NOTE — Procedures (Signed)
Procedure: Right tube thoracostomy with conscious sedation  Indication: Traumatic right hemopneumothorax  Surgeon: Silvestre Gunner, PA-C  Assist: None  Anesthesia: 2mg  Versed, 166mcg fentanyl, 93ml plain lidocaine  EBL: <61PJ  Complications: None  Findings: Informed consent was obtained from wife. He was given sedation and analgesia by IV. The area was prepped and draped sterilely. A 2.5cm incision was made in the midaxillary line and blunt dissection was carried down to the pleura. This was entered bluntly over the 4th rib and digitally explored; no adhesions were encountered. A 28 french chest tube was placed with the aid of a trocar which was then withdrawn and the tube directed posteriorly. It was secured with 2 3-0 silk sutures and dressed in the typical fashion. There was 641ml of blood initially. The patient tolerated the procedure well.    Lisette Abu, PA-C Pager: [phone] General Trauma PA Pager: [phone]

## 2014-02-18 NOTE — Progress Notes (Signed)
Utilization review completed.  

## 2014-02-18 NOTE — ED Notes (Signed)
Pt. Return from CT.

## 2014-02-18 NOTE — ED Notes (Signed)
Per EMS pt has fallen 2-3 times in the past two days; pt was sitting in chair and fell forward possibly hitting head on coffee table; pt hit back on table on fall two days ago. Pt ambulates independently; pt unable to speak without pulling knees to chest; Pt from home with spouse; pt grimaces upon movement; bruise noted right flank area; pt has hx of memory loss; Per EMS pt can get a little aggressive at times swatting at you. Pt Alert on arrival.

## 2014-02-19 ENCOUNTER — Inpatient Hospital Stay (HOSPITAL_COMMUNITY): Payer: Medicare Other

## 2014-02-19 DIAGNOSIS — S272XXA Traumatic hemopneumothorax, initial encounter: Principal | ICD-10-CM

## 2014-02-19 DIAGNOSIS — D696 Thrombocytopenia, unspecified: Secondary | ICD-10-CM

## 2014-02-19 DIAGNOSIS — R296 Repeated falls: Secondary | ICD-10-CM

## 2014-02-19 DIAGNOSIS — F0390 Unspecified dementia without behavioral disturbance: Secondary | ICD-10-CM

## 2014-02-19 DIAGNOSIS — R55 Syncope and collapse: Secondary | ICD-10-CM

## 2014-02-19 DIAGNOSIS — I4891 Unspecified atrial fibrillation: Secondary | ICD-10-CM

## 2014-02-19 LAB — CBC WITH DIFFERENTIAL/PLATELET
Basophils Absolute: 0 10*3/uL (ref 0.0–0.1)
Basophils Relative: 0 % (ref 0–1)
EOS PCT: 0 % (ref 0–5)
Eosinophils Absolute: 0 10*3/uL (ref 0.0–0.7)
HCT: 28.3 % — ABNORMAL LOW (ref 39.0–52.0)
HEMOGLOBIN: 9.4 g/dL — AB (ref 13.0–17.0)
LYMPHS ABS: 1.4 10*3/uL (ref 0.7–4.0)
Lymphocytes Relative: 15 % (ref 12–46)
MCH: 30.7 pg (ref 26.0–34.0)
MCHC: 33.2 g/dL (ref 30.0–36.0)
MCV: 92.5 fL (ref 78.0–100.0)
MONOS PCT: 11 % (ref 3–12)
Monocytes Absolute: 1.1 10*3/uL — ABNORMAL HIGH (ref 0.1–1.0)
NEUTROS PCT: 74 % (ref 43–77)
Neutro Abs: 7.2 10*3/uL (ref 1.7–7.7)
Platelets: 117 10*3/uL — ABNORMAL LOW (ref 150–400)
RBC: 3.06 MIL/uL — AB (ref 4.22–5.81)
RDW: 14.2 % (ref 11.5–15.5)
WBC: 9.7 10*3/uL (ref 4.0–10.5)

## 2014-02-19 LAB — BASIC METABOLIC PANEL
Anion gap: 11 (ref 5–15)
BUN: 19 mg/dL (ref 6–23)
CHLORIDE: 107 meq/L (ref 96–112)
CO2: 22 meq/L (ref 19–32)
CREATININE: 0.93 mg/dL (ref 0.50–1.35)
Calcium: 8 mg/dL — ABNORMAL LOW (ref 8.4–10.5)
GFR calc Af Amer: >90 mL/min (ref >90)
GFR calc non Af Amer: 78 mL/min — ABNORMAL LOW (ref >90)
Glucose, Bld: 137 mg/dL — ABNORMAL HIGH (ref 70–99)
POTASSIUM: 4.3 meq/L (ref 3.7–5.3)
Sodium: 140 mEq/L (ref 137–147)

## 2014-02-19 LAB — GLUCOSE, CAPILLARY
GLUCOSE-CAPILLARY: 152 mg/dL — AB (ref 70–99)
Glucose-Capillary: 127 mg/dL — ABNORMAL HIGH (ref 70–99)
Glucose-Capillary: 155 mg/dL — ABNORMAL HIGH (ref 70–99)

## 2014-02-19 LAB — MAGNESIUM: Magnesium: 2.1 mg/dL (ref 1.5–2.5)

## 2014-02-19 MED ORDER — SODIUM CHLORIDE 0.9 % IV SOLN
INTRAVENOUS | Status: AC
  Administered 2014-02-19 (×2): via INTRAVENOUS

## 2014-02-19 NOTE — Progress Notes (Signed)
  Date: 02/19/2014  Patient name: Eddie [name]  Medical record number: [id number]  Date of birth: 1933/01/02   I have seen and evaluated Eddie [name] and discussed their care with the Residency Team. Eddie [name] recently moved here with his wife from [place] to be closer to his family. His niece is Eddie [name]. He gave permission for me to talk to Eddie [name]. Today, Eddie [name] and Eddie [name] stated that he blacked out which made him fall. Otherwise, no freq falls (except all on the 30th) and is independent. They living within walking distance of grocery store and just walk there.   Assessment and Plan: I have seen and evaluated the patient as outlined above. I agree with the formulated Assessment and Plan as detailed in the residents' admission note, with the following changes:   1. Large Hemothorax with small <10% pneumothorax on the right, traumatic - this is being managed by trauma surgery. Chest tube on water seal. HgB dropped 13 > 10.4 > 9.4. Has no resp sxs other than pain with the rib fractures.  2. Syncope - diff is broad and we have no old records. Diff inc *AMI but EKG and Trop x2 negative so doubt *PE but on pradaxa so doubt *massive CVA but CT negative and no focal abnl *arrythmia on tele and will check strips *electrolyte abnl but labs OK and pt is NOT DM *structural dz like outflow obstruction - would prefer to get old records to see if ECHO done in past *orhtostatic might be most likely cause. Can do orthostatics once stable.   3. A Fib - must hold pradaxa. Cont sotolol. Will need discussion of risks / benefits of restarting pradaxa once stable.   4. Falls - PT and OT. Have rec SNF vs 24 hr assist. Will discuss with Eddie [name].  5. Thrombocytopenia - follow. Get old records.  6. Dementia - cont home meds.  Eddie Crews, MD 12/3/20154:21 PM \

## 2014-02-19 NOTE — Clinical Social Work Note (Signed)
Received Social Work Consult did not have time to see patient today will see patient tomorrow to complete assessment.  Jones Broom. [place], MSW, [place] 02/19/2014 5:11 PM

## 2014-02-19 NOTE — Progress Notes (Signed)
Occupational Therapy Evaluation Patient Details Name: Eddie [name] MRN: [id number] DOB: 17-Dec-1932 Today's Date: 02/19/2014    History of Present Illness patient is a 80 yo male s/p mechanical fall x2 over the past week with resultant multiple rib fractures and hemothorax, patient with chest tube placed.   Clinical Impression   PTA pt lived at home with his wife and was her primary caretaker (wife has dementia) Pt was independent with ADLs, however has a hx of recent falls. Pt presents with pain and weakness which impair his ability to be independent with ADLs and am concerned about pt's ability to care for self and wife. Pt will need SNF at d/c for increased safety. Pt will benefit from continued acute OT to address safety with ADLs and functional mobility.     Follow Up Recommendations  SNF;Supervision/Assistance - 24 hour    Equipment Recommendations  Other (comment) (TBD by next venue)    Recommendations for Other Services       Precautions / Restrictions Precautions Precautions: Fall Restrictions Weight Bearing Restrictions: No      Mobility Bed Mobility Overal bed mobility: Needs Assistance Bed Mobility: Rolling;Sidelying to Sit Rolling: Min assist Sidelying to sit: Mod assist       General bed mobility comments: VCs for sequencing and positioning, moderate assist to elevate to upright for trunk support, increased pain with mobility  Transfers Overall transfer level: Needs assistance Equipment used: 2 person hand held assist Transfers: Sit to/from Stand Sit to Stand: Mod assist;Max assist;+2 physical assistance         General transfer comment: VCs for initiation of standing, maximal assis tto reach upright initially then able to maintain upright moderate assist for stability         ADL Overall ADL's : Needs assistance/impaired Eating/Feeding: Independent;Sitting   Grooming: Set up;Sitting   Upper Body Bathing: Minimal assitance;Sitting   Lower Body  Bathing: Moderate assistance;+2 for physical assistance;Sit to/from stand   Upper Body Dressing : Minimal assistance;Sitting   Lower Body Dressing: Maximal assistance;+2 for physical assistance;Sit to/from stand   Toilet Transfer: Moderate assistance;+2 for physical assistance;Ambulation (2 person hand held assist bed>recliner)           Functional mobility during ADLs: Moderate assistance;+2 for physical assistance (2 person hand held assist) General ADL Comments: Pt with hx of falls and concerns about his ability to care for himself and his wife. Pt currently requiring +2 Mod (A) for functional mobility.      Vision  Pt reports no change from baseline. No apparent deficits. To be further assessed in functional context.                    Perception Perception Perception Tested?: No   Praxis Praxis Praxis tested?: Within functional limits    Pertinent Vitals/Pain Pain Assessment: Faces Faces Pain Scale: Hurts whole lot Pain Location: right flank Pain Descriptors / Indicators: Grimacing;Guarding;Sharp Pain Intervention(s): Limited activity within patient's tolerance;Premedicated before session;Monitored during session;Relaxation;Repositioned     Hand Dominance Right   Extremity/Trunk Assessment Upper Extremity Assessment Upper Extremity Assessment: Overall WFL for tasks assessed (pt has congenital deformity of L hand)   Lower Extremity Assessment Lower Extremity Assessment: Generalized weakness;Difficult to assess due to impaired cognition   Cervical / Trunk Assessment Cervical / Trunk Assessment: Kyphotic   Communication Communication Communication: HOH   Cognition Arousal/Alertness: Awake/alert Behavior During Therapy: WFL for tasks assessed/performed Overall Cognitive Status: History of cognitive impairments - at baseline  Home Living Family/patient expects to be discharged to:: Skilled nursing facility Living  Arrangements: Spouse/significant other                                      Prior Functioning/Environment Level of Independence: Independent        Comments: per nsg, 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, independent with activity     OT Diagnosis: Generalized weakness;Acute pain   OT Problem List: Decreased strength;Decreased range of motion;Decreased activity tolerance;Impaired balance (sitting and/or standing);Decreased safety awareness;Decreased knowledge of use of DME or AE;Decreased knowledge of precautions;Pain   OT Treatment/Interventions: Self-care/ADL training;Therapeutic exercise;Energy conservation;DME and/or AE instruction;Therapeutic activities;Patient/family education;Balance training    OT Goals(Current goals can be found in the care plan section) Acute Rehab OT Goals Patient Stated Goal: to get moving OT Goal Formulation: With patient Time For Goal Achievement: 03/05/14 Potential to Achieve Goals: Good ADL Goals Pt Will Perform Lower Body Bathing: with min assist;sit to/from stand Pt Will Perform Lower Body Dressing: with min assist;sit to/from stand Pt Will Transfer to Toilet: with min assist;ambulating Pt Will Perform Toileting - Clothing Manipulation and hygiene: with min assist;sit to/from stand  OT Frequency: Min 1X/week   Barriers to D/C: Decreased caregiver support  Pt is caregiver for wife (with dementia) and both will need assistance at d/c.        Co-evaluation PT/OT/SLP Co-Evaluation/Treatment: Yes Reason for Co-Treatment: Complexity of the patient's impairments (multi-system involvement);Necessary to address cognition/behavior during functional activity;For patient/therapist safety   OT goals addressed during session: ADL's and self-care      End of Session Equipment Utilized During Treatment: Gait belt Nurse Communication: Mobility status;Other (comment) (increased output from chest tube with mobility)  Activity Tolerance: Patient  tolerated treatment well Patient left: in chair;with call bell/phone within reach;with family/visitor present   Time: 1430-1509 OT Time Calculation (min): 39 min Charges:  OT General Charges $OT Visit: 1 Procedure OT Evaluation $Initial OT Evaluation Tier I: 1 Procedure OT Treatments $Self Care/Home Management : 8-22 mins $Therapeutic Activity: 8-22 mins  Villa Herb M 02/19/2014, 4:25 PM   Cyndie Chime, OTR/L Occupational Therapist [phone] (pager)

## 2014-02-19 NOTE — Evaluation (Signed)
Physical Therapy Evaluation Patient Details Name: Eddie [name] MRN: [id number] DOB: 1933/02/14 Today's Date: 02/19/2014   History of Present Illness  patient is a 80 yo male s/p mechanical fall x2 over the past wekk with resultant multiple rib fractures and hemothorax, patient with chest tube placed.  Clinical Impression  Patient demonstrates deficits in functional mobility as indicated below. Will need continued skilled PT to address deficits and maximize function. Will see as indicated and progress as tolerated.  Patient will need increased assist for mobility at this time, patient with decreased O2 saturations on room air, 90% at rest and 86% with minimal activity, placed on 2 liters [place] rebounded to 95%.    Follow Up Recommendations SNF;Supervision/Assistance - 24 hour    Equipment Recommendations  Other (comment) (left platform RW)    Recommendations for Other Services       Precautions / Restrictions Precautions Precautions: Fall Restrictions Weight Bearing Restrictions: No      Mobility  Bed Mobility Overal bed mobility: Needs Assistance Bed Mobility: Rolling;Sidelying to Sit Rolling: Min assist Sidelying to sit: Mod assist       General bed mobility comments: VCs for sequencing and positioning, moderate assist to elevate to upright for trunk support, increased pain with mobility  Transfers Overall transfer level: Needs assistance Equipment used: 2 person hand held assist Transfers: Sit to/from Stand Sit to Stand: Mod assist;Max assist;+2 physical assistance         General transfer comment: VCs for initiation of standing, maximal assis tto reach upright initially then able to maintain upright moderate assist for stability  Ambulation/Gait Ambulation/Gait assistance: Mod assist;+2 safety/equipment Ambulation Distance (Feet): 180 Feet Assistive device: 1 person hand held assist Gait Pattern/deviations: Step-through pattern;Decreased stride length;Drifts  right/left;Narrow base of support Gait velocity: decreased Gait velocity interpretation: Below normal speed for age/gender General Gait Details: instability noted with ambulation, multiple balance checks requiring increased assist, cues for pacing   Stairs            Wheelchair Mobility    Modified Rankin (Stroke Patients Only)       Balance Overall balance assessment: Needs assistance;History of Falls         Standing balance support: During functional activity Standing balance-Leahy Scale: Poor Standing balance comment: unsteady with static standing, required assist to maintain upright             High level balance activites: Direction changes;Head turns High Level Balance Comments: increased assist during higher level balance activities             Pertinent Vitals/Pain Pain Assessment: Faces Faces Pain Scale: Hurts whole lot Pain Location: right flank Pain Descriptors / Indicators: Grimacing;Guarding;Sharp Pain Intervention(s): Limited activity within patient's tolerance;Premedicated before session;Monitored during session;Relaxation;Repositioned    Home Living Family/patient expects to be discharged to:: Skilled nursing facility Living Arrangements: Spouse/significant other                    Prior Function Level of Independence: Independent         Comments: per nsg, 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, independent with activity      Hand Dominance   Dominant Hand: Right    Extremity/Trunk Assessment   Upper Extremity Assessment: Defer to OT evaluation           Lower Extremity Assessment: Generalized weakness;Difficult to assess due to impaired cognition      Cervical / Trunk Assessment: Kyphotic  Communication   Communication: HOH  Cognition Arousal/Alertness: Awake/alert Behavior  During Therapy: WFL for tasks assessed/performed Overall Cognitive Status: History of cognitive impairments - at baseline                       General Comments General comments (skin integrity, edema, etc.): increased chest tube drainage with mobility, increased pain right flank, bruising noted left buttck and right flank. O2 monitored via spot checks throughout activity, decreased O2 saturation to 86% on room air, placed on 2 liters [place] with rebound to 95%    Exercises        Assessment/Plan    PT Assessment Patient needs continued PT services  PT Diagnosis Difficulty walking;Generalized weakness;Acute pain   PT Problem List Decreased range of motion;Decreased strength;Decreased activity tolerance;Decreased balance;Decreased mobility;Decreased safety awareness;Pain  PT Treatment Interventions DME instruction;Gait training;Functional mobility training;Therapeutic activities;Therapeutic exercise;Balance training;Patient/family education   PT Goals (Current goals can be found in the Care Plan section) Acute Rehab PT Goals Patient Stated Goal: none stated PT Goal Formulation: With patient Time For Goal Achievement: 03/05/14 Potential to Achieve Goals: Fair    Frequency Min 2X/week   Barriers to discharge        Co-evaluation               End of Session Equipment Utilized During Treatment: Gait belt;Oxygen (chest tube) Activity Tolerance: Patient limited by pain;Patient limited by fatigue Patient left: in chair;with call bell/phone within reach;with family/visitor present;with nursing/sitter in room Nurse Communication: Mobility status         Time: 1430-1509 PT Time Calculation (min) (ACUTE ONLY): 39 min   Charges:   PT Evaluation $Initial PT Evaluation Tier I: 1 Procedure PT Treatments $Gait Training: 8-22 mins   PT G CodesDuncan Dull 02/19/2014, 4:13 PM Alben Deeds, [place] DPT  [phone]

## 2014-02-19 NOTE — Progress Notes (Signed)
Subjective: [name] feels "better" today. He has some pain in his right ribs.  Objective: Vital signs in last 24 hours: Filed Vitals:   02/18/14 1815 02/18/14 2116 02/19/14 0610 02/19/14 1520  BP: 137/50 122/43 104/34 113/49  Pulse: 62 58 59 67  Temp:  98.4 F (36.9 C) 98.1 F (36.7 C) 98.4 F (36.9 C)  TempSrc:  Oral Oral Oral  Resp:  18 16 18   Weight:   156 lb 6.4 oz (70.943 kg)   SpO2: 98% 96% 96% 96%   Weight change:   Intake/Output Summary (Last 24 hours) at 02/19/14 1539 Last data filed at 02/19/14 1400  Gross per 24 hour  Intake   2650 ml  Output    750 ml  Net   1900 ml   Physical  Lab Results: Basic Metabolic Panel:  Recent Labs Lab 02/18/14 0723 02/19/14 0347  NA 143 140  K 5.1 4.3  CL 106 107  CO2 27 22  GLUCOSE 132* 137*  BUN 20 19  CREATININE 0.92 0.93  CALCIUM 9.0 8.0*  MG  --  2.1   Liver Function Tests:  Recent Labs Lab 02/18/14 0723  AST 30  ALT 25  ALKPHOS 63  BILITOT 0.9  PROT 6.4  ALBUMIN 3.7  CBC:  Recent Labs Lab 02/18/14 0723 02/18/14 1416 02/19/14 0347  WBC 9.2 8.8 9.7  NEUTROABS 7.4  --  7.2  HGB 13.0 10.4* 9.4*  HCT 39.2 31.7* 28.3*  MCV 93.3 94.1 92.5  PLT 130* 120* 117*   Cardiac Enzymes:  Recent Labs Lab 02/18/14 1416  TROPONINI <0.30   CBG:  Recent Labs Lab 02/18/14 1608 02/18/14 2113 02/19/14 0613  GLUCAP 231* 136* 127*   Hemoglobin A1C:  Recent Labs Lab 02/18/14 1416  HGBA1C 5.8*   Thyroid Function Tests:  Recent Labs Lab 02/18/14 1416  TSH 0.924   Urinalysis:  Recent Labs Lab 02/18/14 1012  COLORURINE YELLOW  LABSPEC 1.042*  PHURINE 5.0  GLUCOSEU NEGATIVE  HGBUR NEGATIVE  BILIRUBINUR NEGATIVE  KETONESUR 15*  PROTEINUR NEGATIVE  UROBILINOGEN 0.2  NITRITE NEGATIVE  LEUKOCYTESUR NEGATIVE    Micro Results: No results found for this or any previous visit (from the past 240 hour(s)). Studies/Results: Dg Chest 2 View  02/18/2014   CLINICAL DATA:  Fall with  right-sided chest and rib pain. Initial encounter.  EXAM: CHEST  2 VIEW  COMPARISON:  None.  FINDINGS: Multiple right-sided rib fractures. Posterior lateral seventh, eighth, ninth, tenth. There may be segmental fractures inferiorly. There is subcutaneous emphysema adjacent rib fractures. Although no gross pneumothorax is seen, this subcutaneous air suggests pleural air.  Mild cardiomegaly with aortic atherosclerosis. Small volume right-sided pleural fluid/hemothorax. Right base airspace disease.  IMPRESSION: Multiple right-sided rib fractures with adjacent subcutaneous air. This suggests otherwise occult right-sided pneumothorax. Consider further evaluation with chest and abdominal CT.  Small volume right pleural fluid/hemothorax. Adjacent airspace disease which is likely atelectasis.   Electronically Signed   By: Abigail Miyamoto M.D.   On: 02/18/2014 07:44   Ct Head Wo Contrast  02/18/2014   CLINICAL DATA:  Memory loss, head injury after multiple falls in last several days.  EXAM: CT HEAD WITHOUT CONTRAST  CT CERVICAL SPINE WITHOUT CONTRAST  TECHNIQUE: Multidetector CT imaging of the head and cervical spine was performed following the standard protocol without intravenous contrast. Multiplanar CT image reconstructions of the cervical spine were also generated.  COMPARISON:  None.  FINDINGS: CT HEAD FINDINGS  Bony calvarium appears intact.  Mild diffuse cortical atrophy is noted. Mild chronic ischemic white matter disease is noted. No mass effect or midline shift is noted. Ventricular size is within normal limits. There is no evidence of mass lesion, hemorrhage or acute infarction.  CT CERVICAL SPINE FINDINGS  No fracture is noted. Minimal grade 1 retrolisthesis is noted at C6-7 secondary to degenerative disc disease at this level. Moderate degenerative disc disease is also noted at C4-5 and C5-6 with anterior osteophyte formation. Incidental note is made of minimal right apical pneumothorax with associated pleural  effusion.  IMPRESSION: Mild diffuse cortical atrophy. Mild chronic ischemic white matter disease. No acute intracranial abnormality seen.  Multilevel degenerative disc disease is noted in the lower cervical spine. No fracture or significant spondylolisthesis is noted.  Incidental note is made of minimal right apical pneumothorax with associated pleural effusion. Please refer to CT scan of chest of same day for further discussion.   Electronically Signed   By: Sabino Dick M.D.   On: 02/18/2014 10:07   Ct Chest W Contrast  02/18/2014   CLINICAL DATA:  Patient's following 2-3 times in the past 2 days. Bruising over the right flank.  EXAM: CT CHEST, ABDOMEN, AND PELVIS WITH CONTRAST  TECHNIQUE: Multidetector CT imaging of the chest, abdomen and pelvis was performed following the standard protocol during bolus administration of intravenous contrast.  CONTRAST:  141mL OMNIPAQUE IOHEXOL 300 MG/ML  SOLN  COMPARISON:  None.  FINDINGS: CT CHEST FINDINGS  The central airways are patent. There is a large right hemothorax. There is a small right pneumothorax measuring less than 10%. There is soft tissue emphysema in the right lateral chest wall. The left lung is clear.  There are no pathologically enlarged axillary, hilar or mediastinal lymph nodes.  The heart size is normal. There is no pericardial effusion. The thoracic aorta is normal in caliber. Extensive multivessel coronary artery atherosclerosis.  There is a 2.3 x 1.9 cm hypodense right thyroid mass.  Review of bone windows demonstrates no focal lytic or sclerotic lesions. There is a nondisplaced fracture of the right posterior eleventh rib at the costovertebral junction. There is a displaced fracture of the posterolateral tenth rib. There are displaced fractures involving the lateral and posterior seventh, eighth and ninth ribs. There is a displaced fracture of the right anterolateral sixth rib. The vertebral body heights are maintained and are in normal anatomic  alignment. There are anterior bridging osteophytes of the thoracic spine as can be seen with diffuse idiopathic skeletal hyperostosis.  CT ABDOMEN AND PELVIS FINDINGS  The liver demonstrates no focal abnormality. There is no intrahepatic or extrahepatic biliary ductal dilatation. The gallbladder is normal. The spleen demonstrates no focal abnormality.There is a 3.6 x 3.7 cm hypodense, fluid attenuating right renal mass most consistent with a cyst. The kidneys, adrenal glands and pancreas are normal. The bladder is unremarkable.  The stomach, duodenum, small intestine, and large intestine demonstrate no wall thickening or dilatation. There is no pneumoperitoneum, pneumatosis, or portal venous gas. There is no abdominal or pelvic free fluid. There is no lymphadenopathy.  The abdominal aorta is normal in caliber with atherosclerosis.  There are no lytic or sclerotic osseous lesions. There is is degenerative disc disease and facet arthropathy in the lumbar spine. Knee vertebral body heights are maintained and are in normal anatomic alignment.  IMPRESSION: 1. Large right hemothorax. Small right pneumothorax measuring less than 10 percent. Multiple displaced right rib fractures as described above. 2. No acute abdominal or pelvic injury. 3.  Multivessel coronary artery atherosclerosis. 4. 2.3 x 1.9 cm hypodense right thyroid mass. If there is further clinical concern, a dedicated non a margin thyroid ultrasound is recommended for better characterization. These results were called by telephone at the time of interpretation on 02/18/2014 at 10:08 am to [facility], who verbally acknowledged these results.   Electronically Signed   By: Kathreen Devoid   On: 02/18/2014 10:11   Ct Cervical Spine Wo Contrast  02/18/2014   CLINICAL DATA:  Memory loss, head injury after multiple falls in last several days.  EXAM: CT HEAD WITHOUT CONTRAST  CT CERVICAL SPINE WITHOUT CONTRAST  TECHNIQUE: Multidetector CT imaging of the head and  cervical spine was performed following the standard protocol without intravenous contrast. Multiplanar CT image reconstructions of the cervical spine were also generated.  COMPARISON:  None.  FINDINGS: CT HEAD FINDINGS  Bony calvarium appears intact. Mild diffuse cortical atrophy is noted. Mild chronic ischemic white matter disease is noted. No mass effect or midline shift is noted. Ventricular size is within normal limits. There is no evidence of mass lesion, hemorrhage or acute infarction.  CT CERVICAL SPINE FINDINGS  No fracture is noted. Minimal grade 1 retrolisthesis is noted at C6-7 secondary to degenerative disc disease at this level. Moderate degenerative disc disease is also noted at C4-5 and C5-6 with anterior osteophyte formation. Incidental note is made of minimal right apical pneumothorax with associated pleural effusion.  IMPRESSION: Mild diffuse cortical atrophy. Mild chronic ischemic white matter disease. No acute intracranial abnormality seen.  Multilevel degenerative disc disease is noted in the lower cervical spine. No fracture or significant spondylolisthesis is noted.  Incidental note is made of minimal right apical pneumothorax with associated pleural effusion. Please refer to CT scan of chest of same day for further discussion.   Electronically Signed   By: Sabino Dick M.D.   On: 02/18/2014 10:07   Ct Abdomen Pelvis W Contrast  02/18/2014   CLINICAL DATA:  Patient's following 2-3 times in the past 2 days. Bruising over the right flank.  EXAM: CT CHEST, ABDOMEN, AND PELVIS WITH CONTRAST  TECHNIQUE: Multidetector CT imaging of the chest, abdomen and pelvis was performed following the standard protocol during bolus administration of intravenous contrast.  CONTRAST:  166mL OMNIPAQUE IOHEXOL 300 MG/ML  SOLN  COMPARISON:  None.  FINDINGS: CT CHEST FINDINGS  The central airways are patent. There is a large right hemothorax. There is a small right pneumothorax measuring less than 10%. There is  soft tissue emphysema in the right lateral chest wall. The left lung is clear.  There are no pathologically enlarged axillary, hilar or mediastinal lymph nodes.  The heart size is normal. There is no pericardial effusion. The thoracic aorta is normal in caliber. Extensive multivessel coronary artery atherosclerosis.  There is a 2.3 x 1.9 cm hypodense right thyroid mass.  Review of bone windows demonstrates no focal lytic or sclerotic lesions. There is a nondisplaced fracture of the right posterior eleventh rib at the costovertebral junction. There is a displaced fracture of the posterolateral tenth rib. There are displaced fractures involving the lateral and posterior seventh, eighth and ninth ribs. There is a displaced fracture of the right anterolateral sixth rib. The vertebral body heights are maintained and are in normal anatomic alignment. There are anterior bridging osteophytes of the thoracic spine as can be seen with diffuse idiopathic skeletal hyperostosis.  CT ABDOMEN AND PELVIS FINDINGS  The liver demonstrates no focal abnormality. There is no intrahepatic  or extrahepatic biliary ductal dilatation. The gallbladder is normal. The spleen demonstrates no focal abnormality.There is a 3.6 x 3.7 cm hypodense, fluid attenuating right renal mass most consistent with a cyst. The kidneys, adrenal glands and pancreas are normal. The bladder is unremarkable.  The stomach, duodenum, small intestine, and large intestine demonstrate no wall thickening or dilatation. There is no pneumoperitoneum, pneumatosis, or portal venous gas. There is no abdominal or pelvic free fluid. There is no lymphadenopathy.  The abdominal aorta is normal in caliber with atherosclerosis.  There are no lytic or sclerotic osseous lesions. There is is degenerative disc disease and facet arthropathy in the lumbar spine. Knee vertebral body heights are maintained and are in normal anatomic alignment.  IMPRESSION: 1. Large right hemothorax. Small  right pneumothorax measuring less than 10 percent. Multiple displaced right rib fractures as described above. 2. No acute abdominal or pelvic injury. 3. Multivessel coronary artery atherosclerosis. 4. 2.3 x 1.9 cm hypodense right thyroid mass. If there is further clinical concern, a dedicated non a margin thyroid ultrasound is recommended for better characterization. These results were called by telephone at the time of interpretation on 02/18/2014 at 10:08 am to [facility], [place], who verbally acknowledged these results.   Electronically Signed   By: Kathreen Devoid   On: 02/18/2014 10:11   Dg Chest Port 1 View  02/19/2014   CLINICAL DATA:  Traumatic hemopneumothorax  EXAM: PORTABLE CHEST - 1 VIEW  COMPARISON:  Portable exam 0710 hr compared to 02/18/2014  FINDINGS: RIGHT thoracostomy tube unchanged.  Normal heart size, mediastinal contours and pulmonary vascularity.  Atherosclerotic calcification aorta.  Bibasilar atelectasis.  No significant pleural effusion or pneumothorax identified.  IMPRESSION: Bibasilar atelectasis.   Electronically Signed   By: Lavonia Dana M.D.   On: 02/19/2014 08:13   Dg Chest Port 1 View  02/18/2014   CLINICAL DATA:  Status post fall. Hemopneumothorax on the right. Chest tube placement.  EXAM: PORTABLE CHEST - 1 VIEW  COMPARISON:  CT chest and PA and lateral chest earlier this same day.  FINDINGS: A new right chest tube is in place. Pleural fluid on the right appears decreased. No pneumothorax is identified. Right basilar atelectasis is noted. Multiple right rib fractures are again seen. The left lung is expanded with mild basilar atelectasis noted. Heart size is normal.  IMPRESSION: Some decrease in right pleural fluid after chest tube placement. Negative for pneumothorax.  Right worse than left basilar atelectasis.  Multiple right rib fractures.   Electronically Signed   By: Inge Rise M.D.   On: 02/18/2014 13:08   Medications: I have reviewed the patient's current  medications. Scheduled Meds: . atorvastatin  80 mg Oral q1800  . B-complex with vitamin C  1 tablet Oral Daily  . donepezil  10 mg Oral Daily  . ezetimibe  10 mg Oral Daily  . fluticasone  2 spray Each Nare Daily  . gabapentin  400 mg Oral Daily  . insulin aspart  0-9 Units Subcutaneous TID WC  . magnesium oxide  400 mg Oral Daily  . Memantine HCl ER  21 mg Oral Q1400  . sodium chloride  3 mL Intravenous Q12H  . sotalol  40 mg Oral BID   Continuous Infusions:  PRN Meds:.HYDROcodone-acetaminophen Assessment/Plan: Principal Problem:   Hemothorax with pneumothorax, traumatic Active Problems:   DM type 2 (diabetes mellitus, type 2)   Atrial fibrillation   Dementia   Hemothorax, right   Rib fractures   Abnormality of  gait   Hypotension   Thrombocytopenia   Thyroid mass   Dyslipidemia  80 year old with traumatic fall with hemothorax, pneumothorax and rib fractures who is now status post chest tube placement.  Large Traumatic Hemothorax with small <10% Pneumothorax, Both Right-Sided: Actively draining. Trauma placed chest tube with waterseal. Hemoglobin trending down 13.0-->10.4-->9.4. - Appreciate trauma recs - Repeat CBC tomorrow am - Incentive spirometry  - Will need repeat imaging of chest - O2 PRN - Norco/Vicodin 1-2 tablets q4 prn for pain  Fragility Fractures, Multiple: Right ribs. - Incentive spirometry and pain control - PT and OT today; encourage mobilization - Outpatient dexa scan  Recurrent Falls: Appear to have been mechanical with no preceding symptoms.- - PT and OT today  Paroxysmal Atrial Fibrillation: Bradycardic in NSR currently. Asymptomatic. Was given Pradaxa-reversing agent in ED (Prixbind). - Continue to hold Pradaxa and Aspirin 81 - Monitor via tele - Continue Sotalol 40 mg bid   Hypotension: No longer hypotensive (110s over 50s), but has dropped in last 24 hours.  - Restart IVF  - Monitor BP closely  DMII: HgbA1c 5.8% on this  admission. - Monitor cbgs, SSI-S  Thrombocytopenia: 130-->120-->118 - Will trend CBC  Incidental Thyroid Mass: Smaller than 3 cm diameter (2.3 x 1.9 cm right thyroid mass), asymptomatic, TSH WNL. - No intervention needed at this time - Recommend f/u in 6 months  Dementia:  - Memantine ER 21 mg, Aricept 10 mg continued - Per EMS tendency to be aggressive though wife declines this, has sitter  Dyslipidemia/CAD/Carotid Artery Stenosis: - Continued Lipitor 80 mg, Zetia - Hold aspirin for now  Diet:  - H/H   DVT Ppx:  - Hold anticoagulation - SCDs only   Dispo: Disposition is deferred at this time, awaiting improvement of current medical problems.  Anticipated discharge in approximately 2 day(s). Patient lives alone with his wife, who cannot manage his medical problems at this time. Need to look into options for assisted living prior to discharge.  The patient does not have a current PCP (No Pcp Per Patient) and does not need an [facility] hospital follow-up appointment after discharge. Patient would like to establish care (new to the area) with Dr. Min[facility].  The patient does have transportation limitations that hinder transportation to clinic appointments.  .Services Needed at time of discharge: Y = Yes, Blank = No PT:   OT:   RN:   Equipment:   Other:     LOS: 1 day   Drucilla Schmidt, MD 02/19/2014, 3:39 PM

## 2014-02-19 NOTE — Progress Notes (Signed)
Trauma Service Note  Subjective: Patient is awake and alert.  No with a lot ov complaints.  Objective: Vital signs in last 24 hours: Temp:  [98.1 F (36.7 C)-98.9 F (37.2 C)] 98.1 F (36.7 C) (12/03 0610) Pulse Rate:  [50-70] 59 (12/03 0610) Resp:  [12-18] 16 (12/03 0610) BP: (85-137)/(30-59) 104/34 mmHg (12/03 0610) SpO2:  [59 %-100 %] 96 % (12/03 0610) Weight:  [67.6 kg (149 lb 0.5 oz)-70.943 kg (156 lb 6.4 oz)] 70.943 kg (156 lb 6.4 oz) (12/03 0610)    Intake/Output from previous day: 12/02 0701 - 12/03 0700 In: 2650 [P.O.:300; I.V.:1350; IV Piggyback:1000] Out: 1350 [Urine:500; Chest Tube:850] Intake/Output this shift: Total I/O In: 120 [P.O.:120] Out: -   General: No acute distress  Lungs: Clear to auscultation.  No air leakage.  CXR shows some improvement in effusion on the right.  Only 150cc out the last shift.  Abd: Benign.  Eating well.  Extremities: No CVT signs or symptoms  Neuro: The same.  Pleasantly confused.  Lab Results: CBC   Recent Labs  02/18/14 1416 02/19/14 0347  WBC 8.8 9.7  HGB 10.4* 9.4*  HCT 31.7* 28.3*  PLT 120* 117*   BMET  Recent Labs  02/18/14 0723 02/19/14 0347  NA 143 140  K 5.1 4.3  CL 106 107  CO2 27 22  GLUCOSE 132* 137*  BUN 20 19  CREATININE 0.92 0.93  CALCIUM 9.0 8.0*   PT/INR No results for input(s): LABPROT, INR in the last 72 hours. ABG No results for input(s): PHART, HCO3 in the last 72 hours.  Invalid input(s): PCO2, PO2  Studies/Results: Dg Chest 2 View  02/18/2014   CLINICAL DATA:  Fall with right-sided chest and rib pain. Initial encounter.  EXAM: CHEST  2 VIEW  COMPARISON:  None.  FINDINGS: Multiple right-sided rib fractures. Posterior lateral seventh, eighth, ninth, tenth. There may be segmental fractures inferiorly. There is subcutaneous emphysema adjacent rib fractures. Although no gross pneumothorax is seen, this subcutaneous air suggests pleural air.  Mild cardiomegaly with aortic  atherosclerosis. Small volume right-sided pleural fluid/hemothorax. Right base airspace disease.  IMPRESSION: Multiple right-sided rib fractures with adjacent subcutaneous air. This suggests otherwise occult right-sided pneumothorax. Consider further evaluation with chest and abdominal CT.  Small volume right pleural fluid/hemothorax. Adjacent airspace disease which is likely atelectasis.   Electronically Signed   By: Abigail Miyamoto M.D.   On: 02/18/2014 07:44   Ct Head Wo Contrast  02/18/2014   CLINICAL DATA:  Memory loss, head injury after multiple falls in last several days.  EXAM: CT HEAD WITHOUT CONTRAST  CT CERVICAL SPINE WITHOUT CONTRAST  TECHNIQUE: Multidetector CT imaging of the head and cervical spine was performed following the standard protocol without intravenous contrast. Multiplanar CT image reconstructions of the cervical spine were also generated.  COMPARISON:  None.  FINDINGS: CT HEAD FINDINGS  Bony calvarium appears intact. Mild diffuse cortical atrophy is noted. Mild chronic ischemic white matter disease is noted. No mass effect or midline shift is noted. Ventricular size is within normal limits. There is no evidence of mass lesion, hemorrhage or acute infarction.  CT CERVICAL SPINE FINDINGS  No fracture is noted. Minimal grade 1 retrolisthesis is noted at C6-7 secondary to degenerative disc disease at this level. Moderate degenerative disc disease is also noted at C4-5 and C5-6 with anterior osteophyte formation. Incidental note is made of minimal right apical pneumothorax with associated pleural effusion.  IMPRESSION: Mild diffuse cortical atrophy. Mild chronic ischemic white matter  disease. No acute intracranial abnormality seen.  Multilevel degenerative disc disease is noted in the lower cervical spine. No fracture or significant spondylolisthesis is noted.  Incidental note is made of minimal right apical pneumothorax with associated pleural effusion. Please refer to CT scan of chest of same  day for further discussion.   Electronically Signed   By: Sabino Dick M.D.   On: 02/18/2014 10:07   Ct Chest W Contrast  02/18/2014   CLINICAL DATA:  Patient's following 2-3 times in the past 2 days. Bruising over the right flank.  EXAM: CT CHEST, ABDOMEN, AND PELVIS WITH CONTRAST  TECHNIQUE: Multidetector CT imaging of the chest, abdomen and pelvis was performed following the standard protocol during bolus administration of intravenous contrast.  CONTRAST:  148mL OMNIPAQUE IOHEXOL 300 MG/ML  SOLN  COMPARISON:  None.  FINDINGS: CT CHEST FINDINGS  The central airways are patent. There is a large right hemothorax. There is a small right pneumothorax measuring less than 10%. There is soft tissue emphysema in the right lateral chest wall. The left lung is clear.  There are no pathologically enlarged axillary, hilar or mediastinal lymph nodes.  The heart size is normal. There is no pericardial effusion. The thoracic aorta is normal in caliber. Extensive multivessel coronary artery atherosclerosis.  There is a 2.3 x 1.9 cm hypodense right thyroid mass.  Review of bone windows demonstrates no focal lytic or sclerotic lesions. There is a nondisplaced fracture of the right posterior eleventh rib at the costovertebral junction. There is a displaced fracture of the posterolateral tenth rib. There are displaced fractures involving the lateral and posterior seventh, eighth and ninth ribs. There is a displaced fracture of the right anterolateral sixth rib. The vertebral body heights are maintained and are in normal anatomic alignment. There are anterior bridging osteophytes of the thoracic spine as can be seen with diffuse idiopathic skeletal hyperostosis.  CT ABDOMEN AND PELVIS FINDINGS  The liver demonstrates no focal abnormality. There is no intrahepatic or extrahepatic biliary ductal dilatation. The gallbladder is normal. The spleen demonstrates no focal abnormality.There is a 3.6 x 3.7 cm hypodense, fluid attenuating  right renal mass most consistent with a cyst. The kidneys, adrenal glands and pancreas are normal. The bladder is unremarkable.  The stomach, duodenum, small intestine, and large intestine demonstrate no wall thickening or dilatation. There is no pneumoperitoneum, pneumatosis, or portal venous gas. There is no abdominal or pelvic free fluid. There is no lymphadenopathy.  The abdominal aorta is normal in caliber with atherosclerosis.  There are no lytic or sclerotic osseous lesions. There is is degenerative disc disease and facet arthropathy in the lumbar spine. Knee vertebral body heights are maintained and are in normal anatomic alignment.  IMPRESSION: 1. Large right hemothorax. Small right pneumothorax measuring less than 10 percent. Multiple displaced right rib fractures as described above. 2. No acute abdominal or pelvic injury. 3. Multivessel coronary artery atherosclerosis. 4. 2.3 x 1.9 cm hypodense right thyroid mass. If there is further clinical concern, a dedicated non a margin thyroid ultrasound is recommended for better characterization. These results were called by telephone at the time of interpretation on 02/18/2014 at 10:08 am to [facility] [place], who verbally acknowledged these results.   Electronically Signed   By: Kathreen Devoid   On: 02/18/2014 10:11   Ct Cervical Spine Wo Contrast  02/18/2014   CLINICAL DATA:  Memory loss, head injury after multiple falls in last several days.  EXAM: CT HEAD WITHOUT CONTRAST  CT  CERVICAL SPINE WITHOUT CONTRAST  TECHNIQUE: Multidetector CT imaging of the head and cervical spine was performed following the standard protocol without intravenous contrast. Multiplanar CT image reconstructions of the cervical spine were also generated.  COMPARISON:  None.  FINDINGS: CT HEAD FINDINGS  Bony calvarium appears intact. Mild diffuse cortical atrophy is noted. Mild chronic ischemic white matter disease is noted. No mass effect or midline shift is noted. Ventricular size is  within normal limits. There is no evidence of mass lesion, hemorrhage or acute infarction.  CT CERVICAL SPINE FINDINGS  No fracture is noted. Minimal grade 1 retrolisthesis is noted at C6-7 secondary to degenerative disc disease at this level. Moderate degenerative disc disease is also noted at C4-5 and C5-6 with anterior osteophyte formation. Incidental note is made of minimal right apical pneumothorax with associated pleural effusion.  IMPRESSION: Mild diffuse cortical atrophy. Mild chronic ischemic white matter disease. No acute intracranial abnormality seen.  Multilevel degenerative disc disease is noted in the lower cervical spine. No fracture or significant spondylolisthesis is noted.  Incidental note is made of minimal right apical pneumothorax with associated pleural effusion. Please refer to CT scan of chest of same day for further discussion.   Electronically Signed   By: Sabino Dick M.D.   On: 02/18/2014 10:07   Ct Abdomen Pelvis W Contrast  02/18/2014   CLINICAL DATA:  Patient's following 2-3 times in the past 2 days. Bruising over the right flank.  EXAM: CT CHEST, ABDOMEN, AND PELVIS WITH CONTRAST  TECHNIQUE: Multidetector CT imaging of the chest, abdomen and pelvis was performed following the standard protocol during bolus administration of intravenous contrast.  CONTRAST:  141mL OMNIPAQUE IOHEXOL 300 MG/ML  SOLN  COMPARISON:  None.  FINDINGS: CT CHEST FINDINGS  The central airways are patent. There is a large right hemothorax. There is a small right pneumothorax measuring less than 10%. There is soft tissue emphysema in the right lateral chest wall. The left lung is clear.  There are no pathologically enlarged axillary, hilar or mediastinal lymph nodes.  The heart size is normal. There is no pericardial effusion. The thoracic aorta is normal in caliber. Extensive multivessel coronary artery atherosclerosis.  There is a 2.3 x 1.9 cm hypodense right thyroid mass.  Review of bone windows demonstrates  no focal lytic or sclerotic lesions. There is a nondisplaced fracture of the right posterior eleventh rib at the costovertebral junction. There is a displaced fracture of the posterolateral tenth rib. There are displaced fractures involving the lateral and posterior seventh, eighth and ninth ribs. There is a displaced fracture of the right anterolateral sixth rib. The vertebral body heights are maintained and are in normal anatomic alignment. There are anterior bridging osteophytes of the thoracic spine as can be seen with diffuse idiopathic skeletal hyperostosis.  CT ABDOMEN AND PELVIS FINDINGS  The liver demonstrates no focal abnormality. There is no intrahepatic or extrahepatic biliary ductal dilatation. The gallbladder is normal. The spleen demonstrates no focal abnormality.There is a 3.6 x 3.7 cm hypodense, fluid attenuating right renal mass most consistent with a cyst. The kidneys, adrenal glands and pancreas are normal. The bladder is unremarkable.  The stomach, duodenum, small intestine, and large intestine demonstrate no wall thickening or dilatation. There is no pneumoperitoneum, pneumatosis, or portal venous gas. There is no abdominal or pelvic free fluid. There is no lymphadenopathy.  The abdominal aorta is normal in caliber with atherosclerosis.  There are no lytic or sclerotic osseous lesions. There is is degenerative disc  disease and facet arthropathy in the lumbar spine. Knee vertebral body heights are maintained and are in normal anatomic alignment.  IMPRESSION: 1. Large right hemothorax. Small right pneumothorax measuring less than 10 percent. Multiple displaced right rib fractures as described above. 2. No acute abdominal or pelvic injury. 3. Multivessel coronary artery atherosclerosis. 4. 2.3 x 1.9 cm hypodense right thyroid mass. If there is further clinical concern, a dedicated non a margin thyroid ultrasound is recommended for better characterization. These results were called by telephone at  the time of interpretation on 02/18/2014 at 10:08 am to [facility], [place], who verbally acknowledged these results.   Electronically Signed   By: Kathreen Devoid   On: 02/18/2014 10:11   Dg Chest Port 1 View  02/19/2014   CLINICAL DATA:  Traumatic hemopneumothorax  EXAM: PORTABLE CHEST - 1 VIEW  COMPARISON:  Portable exam 0710 hr compared to 02/18/2014  FINDINGS: RIGHT thoracostomy tube unchanged.  Normal heart size, mediastinal contours and pulmonary vascularity.  Atherosclerotic calcification aorta.  Bibasilar atelectasis.  No significant pleural effusion or pneumothorax identified.  IMPRESSION: Bibasilar atelectasis.   Electronically Signed   By: Lavonia Dana M.D.   On: 02/19/2014 08:13   Dg Chest Port 1 View  02/18/2014   CLINICAL DATA:  Status post fall. Hemopneumothorax on the right. Chest tube placement.  EXAM: PORTABLE CHEST - 1 VIEW  COMPARISON:  CT chest and PA and lateral chest earlier this same day.  FINDINGS: A new right chest tube is in place. Pleural fluid on the right appears decreased. No pneumothorax is identified. Right basilar atelectasis is noted. Multiple right rib fractures are again seen. The left lung is expanded with mild basilar atelectasis noted. Heart size is normal.  IMPRESSION: Some decrease in right pleural fluid after chest tube placement. Negative for pneumothorax.  Right worse than left basilar atelectasis.  Multiple right rib fractures.   Electronically Signed   By: Inge Rise M.D.   On: 02/18/2014 13:08    Anti-infectives: Anti-infectives    None      Assessment/Plan: s/p  Advance diet Chest tube to waterseal.  LOS: 1 day   Kathryne Eriksson. Dahlia Bailiff, MD, FACS [phone] Trauma Surgeon 02/19/2014

## 2014-02-20 ENCOUNTER — Encounter (HOSPITAL_COMMUNITY): Payer: Self-pay | Admitting: Internal Medicine

## 2014-02-20 ENCOUNTER — Inpatient Hospital Stay (HOSPITAL_COMMUNITY): Payer: Medicare Other

## 2014-02-20 LAB — GLUCOSE, CAPILLARY
GLUCOSE-CAPILLARY: 117 mg/dL — AB (ref 70–99)
GLUCOSE-CAPILLARY: 144 mg/dL — AB (ref 70–99)
Glucose-Capillary: 116 mg/dL — ABNORMAL HIGH (ref 70–99)
Glucose-Capillary: 137 mg/dL — ABNORMAL HIGH (ref 70–99)

## 2014-02-20 LAB — CBC WITH DIFFERENTIAL/PLATELET
BASOS ABS: 0 10*3/uL (ref 0.0–0.1)
BASOS PCT: 0 % (ref 0–1)
EOS ABS: 0.1 10*3/uL (ref 0.0–0.7)
Eosinophils Relative: 1 % (ref 0–5)
HCT: 28.6 % — ABNORMAL LOW (ref 39.0–52.0)
Hemoglobin: 9.5 g/dL — ABNORMAL LOW (ref 13.0–17.0)
Lymphocytes Relative: 14 % (ref 12–46)
Lymphs Abs: 1.2 10*3/uL (ref 0.7–4.0)
MCH: 30.9 pg (ref 26.0–34.0)
MCHC: 33.2 g/dL (ref 30.0–36.0)
MCV: 93.2 fL (ref 78.0–100.0)
Monocytes Absolute: 1.1 10*3/uL — ABNORMAL HIGH (ref 0.1–1.0)
Monocytes Relative: 13 % — ABNORMAL HIGH (ref 3–12)
Neutro Abs: 6.1 10*3/uL (ref 1.7–7.7)
Neutrophils Relative %: 72 % (ref 43–77)
Platelets: 107 10*3/uL — ABNORMAL LOW (ref 150–400)
RBC: 3.07 MIL/uL — ABNORMAL LOW (ref 4.22–5.81)
RDW: 14.1 % (ref 11.5–15.5)
WBC: 8.5 10*3/uL (ref 4.0–10.5)

## 2014-02-20 NOTE — Clinical Social Work Note (Addendum)
Completed assessment, but was not able to type up because of system being down, will complete on Monday.  Patient prefers to have home health, but will go to SNF as a back up plan.  Patient and family were educated about SNF vs Home health and PACE program.  Patient's wife and nephew were at bedside and wife is patient's caregiver because of his dementia.  CSW to follow up with patient and wife on Monday.  Jones Broom. [place], MSW, [place] 02/20/2014 5:17 PM

## 2014-02-20 NOTE — Progress Notes (Addendum)
Subjective: Eddie [name] feels "good" today. He was able to walk the halls with PT yesterday and his pain has been controlled with his pain medications. He slept well last night.  Objective: Vital signs in last 24 hours: Filed Vitals:   02/19/14 1520 02/19/14 2017 02/20/14 0335 02/20/14 1419  BP: 113/49 145/48 142/54 102/41  Pulse: 67 70 64 52  Temp: 98.4 F (36.9 C) 100 F (37.8 C) 98.7 F (37.1 C) 98.1 F (36.7 C)  TempSrc: Oral Oral Oral Oral  Resp: 18 18 16 18   Weight:   154 lb 9.6 oz (70.126 kg)   SpO2: 96% 96% 97% 98%   Weight change: 5 lb 9.1 oz (2.526 kg)  Intake/Output Summary (Last 24 hours) at 02/20/14 1511 Last data filed at 02/20/14 0919  Gross per 24 hour  Intake 281.25 ml  Output   1625 ml  Net -1343.75 ml   Physical Exam:  General: Pleasantly demented, resting in bed, NAD, sitter at bedside HEENT: /AT, PERRL, EOMi, no scleral icterus, left forehead abrasion Cardiac: RRR, +systolic murmur, no gallops Pulm: decreased breath sound right, CTAB left Abd/GU: soft, tender to palpation over rib fractures right, ecchymoses right flank and gluteal region, chest tube in place, nondistended, BS present Ext: warm and well perfused, no pedal edema, 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) Neuro: A&Ox2 (knows name, hospital, wife's name, cannot identify who drew him pictures that are hanging in his room, not oriented to time), CN 2-12 grossly intact, moving all 4 extremities  Lab Results: Basic Metabolic Panel:  Recent Labs Lab 02/18/14 0723 02/19/14 0347  NA 143 140  K 5.1 4.3  CL 106 107  CO2 27 22  GLUCOSE 132* 137*  BUN 20 19  CREATININE 0.92 0.93  CALCIUM 9.0 8.0*  MG  --  2.1   Liver Function Tests:  Recent Labs Lab 02/18/14 0723  AST 30  ALT 25  ALKPHOS 63  BILITOT 0.9  PROT 6.4  ALBUMIN 3.7  CBC:  Recent Labs Lab 02/19/14 0347 02/20/14 0336  WBC 9.7 8.5  NEUTROABS 7.2 6.1  HGB 9.4* 9.5*  HCT 28.3* 28.6*  MCV 92.5 93.2    PLT 117* 107*   Cardiac Enzymes:  Recent Labs Lab 02/18/14 1416  TROPONINI <0.30   CBG:  Recent Labs Lab 02/18/14 2113 02/19/14 0613 02/19/14 1607 02/19/14 2052 02/20/14 0652 02/20/14 1109  GLUCAP 136* 127* 155* 152* 117* 144*   Hemoglobin A1C:  Recent Labs Lab 02/18/14 1416  HGBA1C 5.8*   Thyroid Function Tests:  Recent Labs Lab 02/18/14 1416  TSH 0.924   Urinalysis:  Recent Labs Lab 02/18/14 1012  COLORURINE YELLOW  LABSPEC 1.042*  PHURINE 5.0  GLUCOSEU NEGATIVE  HGBUR NEGATIVE  BILIRUBINUR NEGATIVE  KETONESUR 15*  PROTEINUR NEGATIVE  UROBILINOGEN 0.2  NITRITE NEGATIVE  LEUKOCYTESUR NEGATIVE    Micro Results: No results found for this or any previous visit (from the past 240 hour(s)). Studies/Results: Dg Chest Port 1 View  02/20/2014   CLINICAL DATA:  Shortness of breath.  EXAM: PORTABLE CHEST - 1 VIEW  COMPARISON:  04/22/2013 .  FINDINGS: Right chest tube in stable position. Mediastinum and hilar structures are normal. Heart size stable. Persistent right base atelectasis and/or infiltrate. Subsegmental atelectasis left lung base unchanged. No pleural effusion or pneumothorax. Multiple right rib fractures.  IMPRESSION: 1. Right chest tube in stable position.  No pneumothorax. 2. Stable right base atelectasis and/or infiltrate. Stable subsegmental atelectasis left lung base. 3. Multiple right  rib fractures.   Electronically Signed   By: Marcello Moores  Register   On: 02/20/2014 07:57   Dg Chest Port 1 View  02/19/2014   CLINICAL DATA:  Traumatic hemopneumothorax  EXAM: PORTABLE CHEST - 1 VIEW  COMPARISON:  Portable exam 0710 hr compared to 02/18/2014  FINDINGS: RIGHT thoracostomy tube unchanged.  Normal heart size, mediastinal contours and pulmonary vascularity.  Atherosclerotic calcification aorta.  Bibasilar atelectasis.  No significant pleural effusion or pneumothorax identified.  IMPRESSION: Bibasilar atelectasis.   Electronically Signed   By: Lavonia Dana  M.D.   On: 02/19/2014 08:13   Medications: I have reviewed the patient's current medications. Scheduled Meds: . atorvastatin  80 mg Oral q1800  . B-complex with vitamin C  1 tablet Oral Daily  . donepezil  10 mg Oral Daily  . ezetimibe  10 mg Oral Daily  . fluticasone  2 spray Each Nare Daily  . gabapentin  400 mg Oral Daily  . insulin aspart  0-9 Units Subcutaneous TID WC  . magnesium oxide  400 mg Oral Daily  . Memantine HCl ER  21 mg Oral Q1400  . sodium chloride  3 mL Intravenous Q12H  . sotalol  40 mg Oral BID   Continuous Infusions: . sodium chloride 75 mL/hr at 02/19/14 2021   ECHO 06/24/2013: EF 60% with mild concentric LV hypertrophy, moderate fibrocalcific changes of the MV. Trace MR. Severely sclerotic and moderately stenotic appearing trileaflet aortic valve. The left ventricular outflow tract velocity is 0.8 m/s and the aortic velocity is 3.7 m/s. Leads to a peak gradient of 56 mmHg and a mean of 34. Valve area calculated to be 0.7 cm2. Mild aortic insufficiency.  Impression: Based on doppler data, moderate aortic stenosis, however, based upon the continuity equation, the patient has severe to perhaps critical aortic stenosis. Visual inspection is less impressive. However, patient does have mild LV hypertrophy with preserved diastolic dysfunction. MV has degenerative changes but functions normally. The MV regurg as described is not significant.   PRN Meds:.HYDROcodone-acetaminophen Assessment/Plan: Principal Problem:   Hemothorax with pneumothorax, traumatic Active Problems:   Atrial fibrillation   Dementia   Hemothorax, right   Rib fractures   Abnormality of gait   Hypotension   Thrombocytopenia   Thyroid mass   Dyslipidemia  80 year old man on home Pradaxa with traumatic fall with hemothorax, pneumothorax and rib fractures on the right who is now status post chest tube placement and Pradaxa-reversal with Praxbind.  Large Traumatic Hemothorax with small <10%  Pneumothorax, Both Right-Sided: Pneumothorax has resolved on CXR. Chest tube is actively draining with vacuseal in place. Hemoglobin trending down 13.0-->10.4-->9.4-->9.5. Some pain, but not interfering with his breathing. - Appreciate trauma recs - Repeat CBC tomorrow am - Repeat CXR tomorrow am - Incentive spirometry - O2 PRN - Norco/Vicodin 1-2 tablets q4 prn for pain  Fragility Fractures, Multiple: Right ribs. - Incentive spirometry and pain control as above - PT and OT following; encourage mobilization - Outpatient dexa scan  Recurrent Falls: Unclear whether fall was mechanical in the context of dementia or whether patient may have "blacked out" prior to the fall after rising from a chair. PE is not likely, as patient was on pradaxa. Had a massive CVA in past, but CT now negative with no focal abnormalities. He could have had an acute MI, but the patient's EKG and troponins were negative on admission. His labs are not showing electrolyte abnormalities. It is very possible that the fall was a mechanical  fall or due to orthostatic hypotension. Most recent echo from PCP in [place] (06/2013) was obtained to assess likelihood of outflow obstruction; interestingly, this report (see above) showed the following: based on doppler data, moderate aortic stenosis, however, based upon the continuity equation, the patient has severe to perhaps critical aortic stenosis. Visual inspection is less impressive. However, patient does have mild LV hypertrophy with preserved diastolic dysfunction. MV has degenerative changes but functions normally. The MV regurg as described is not significant.  - Consider repeat echo given that aortic stenosis was visualized in 06/2013 or consider cardiology consult (catheterization necessary once patient is more stable?) - Orthostatics pending (to be done today) - PT and OT   Thrombocytopenia: 130-->120-->118-->117-->107. Platelets were 140 per PCP records in 11/2013. I cannot  find thrombocytopenia as a side effect of Praxbind. He is on no other medications that are known to cause thrombocytopenia; simvastatin can cause thrombocytopenia, and he is on atorvastatin. Haloperidol can cause it, and it was mentioned in early notes that this could be given if necessary to the patient; however, it was never given. - Continue to trend  Paroxysmal Atrial Fibrillation: Bradycardic in NSR currently. Asymptomatic. Was given Pradaxa-reversing agent in ED (Praxbind). CHADS 2 score is 2; CHADS VASc is 4.  - Continue to hold Pradaxa and Aspirin 81 - Consider restarting anticoagulation once stable - Monitor via tele - Continue Sotalol 40 mg bid   Hypotension: No longer hypotensive (110s over 50s), lowest 102/41 in last 24 hours. - Monitor BP closely  Incidental Thyroid Mass: Smaller than 3 cm diameter (2.3 x 1.9 cm right thyroid mass), asymptomatic, TSH WNL. - No intervention needed at this time - Recommend f/u in 6 months  Dementia:  - Memantine ER 21 mg, Aricept 10 mg continued - Per EMS tendency to be aggressive though wife declines this, has sitter  Dyslipidemia/CAD/Carotid Artery Stenosis: Wife states that patient had CEA on the left and doctors were "watching his right" carotid artery. Can obtain records from Dr. Luvenia Heller if necessary. - Continued Lipitor 80 mg, Zetia - Hold aspirin for now  Diet:  - H/H   DVT Ppx:  - Hold anticoagulation - SCDs only   Dispo: Disposition is deferred at this time, awaiting improvement of current medical problems.  Anticipated discharge in approximately 2-3 day(s). Patient lives alone with his wife, who cannot manage his medical problems at this time. Looking into 24 hour assist (wife's preference) with PT / RN / and aide vs SNF. Given information on PACE.  The patient does not have a current PCP (No Pcp Per Patient) and does not need an [facility] hospital follow-up appointment after discharge. Patient would like to establish care (new to  the area) with Dr. Minus Liberty.  The patient does have transportation limitations that hinder transportation to clinic appointments.  .Services Needed at time of discharge: Y = Yes, Blank = No PT:   OT:   RN:   Equipment:   Other:     LOS: 2 days   Drucilla Schmidt, MD 02/20/2014, 3:11 PM

## 2014-02-20 NOTE — Progress Notes (Signed)
Trauma Service Note  Subjective: Patient seems comfortable.  No distress  Objective: Vital signs in last 24 hours: Temp:  [98.4 F (36.9 C)-100 F (37.8 C)] 98.7 F (37.1 C) (12/04 0335) Pulse Rate:  [64-70] 64 (12/04 0335) Resp:  [16-18] 16 (12/04 0335) BP: (113-145)/(48-54) 142/54 mmHg (12/04 0335) SpO2:  [96 %-97 %] 97 % (12/04 0335) Weight:  [70.126 kg (154 lb 9.6 oz)] 70.126 kg (154 lb 9.6 oz) (12/04 0335) Last BM Date: 02/18/14  Intake/Output from previous day: 12/03 0701 - 12/04 0700 In: 401.3 [P.O.:360; I.V.:41.3] Out: 1525 [Urine:1225; Chest Tube:300] Intake/Output this shift: Total I/O In: -  Out: 100 [Chest Tube:100]  General: Calm, no acute distress  Lungs: Clear.  Have not been able to visualize CXR yet.  CT output was 100 the last shift, 300cc for yesterday.  Too much to remove today.  Abd: Benign and eating  Extremities: No changes  Neuro: Still pleasantly confused.  Lab Results: CBC   Recent Labs  02/19/14 0347 02/20/14 0336  WBC 9.7 8.5  HGB 9.4* 9.5*  HCT 28.3* 28.6*  PLT 117* 107*   BMET  Recent Labs  02/18/14 0723 02/19/14 0347  NA 143 140  K 5.1 4.3  CL 106 107  CO2 27 22  GLUCOSE 132* 137*  BUN 20 19  CREATININE 0.92 0.93  CALCIUM 9.0 8.0*   PT/INR No results for input(s): LABPROT, INR in the last 72 hours. ABG No results for input(s): PHART, HCO3 in the last 72 hours.  Invalid input(s): PCO2, PO2  Studies/Results: Ct Head Wo Contrast  02/18/2014   CLINICAL DATA:  Memory loss, head injury after multiple falls in last several days.  EXAM: CT HEAD WITHOUT CONTRAST  CT CERVICAL SPINE WITHOUT CONTRAST  TECHNIQUE: Multidetector CT imaging of the head and cervical spine was performed following the standard protocol without intravenous contrast. Multiplanar CT image reconstructions of the cervical spine were also generated.  COMPARISON:  None.  FINDINGS: CT HEAD FINDINGS  Bony calvarium appears intact. Mild diffuse cortical  atrophy is noted. Mild chronic ischemic white matter disease is noted. No mass effect or midline shift is noted. Ventricular size is within normal limits. There is no evidence of mass lesion, hemorrhage or acute infarction.  CT CERVICAL SPINE FINDINGS  No fracture is noted. Minimal grade 1 retrolisthesis is noted at C6-7 secondary to degenerative disc disease at this level. Moderate degenerative disc disease is also noted at C4-5 and C5-6 with anterior osteophyte formation. Incidental note is made of minimal right apical pneumothorax with associated pleural effusion.  IMPRESSION: Mild diffuse cortical atrophy. Mild chronic ischemic white matter disease. No acute intracranial abnormality seen.  Multilevel degenerative disc disease is noted in the lower cervical spine. No fracture or significant spondylolisthesis is noted.  Incidental note is made of minimal right apical pneumothorax with associated pleural effusion. Please refer to CT scan of chest of same day for further discussion.   Electronically Signed   By: Sabino Dick M.D.   On: 02/18/2014 10:07   Ct Chest W Contrast  02/18/2014   CLINICAL DATA:  Patient's following 2-3 times in the past 2 days. Bruising over the right flank.  EXAM: CT CHEST, ABDOMEN, AND PELVIS WITH CONTRAST  TECHNIQUE: Multidetector CT imaging of the chest, abdomen and pelvis was performed following the standard protocol during bolus administration of intravenous contrast.  CONTRAST:  130mL OMNIPAQUE IOHEXOL 300 MG/ML  SOLN  COMPARISON:  None.  FINDINGS: CT CHEST FINDINGS  The central  airways are patent. There is a large right hemothorax. There is a small right pneumothorax measuring less than 10%. There is soft tissue emphysema in the right lateral chest wall. The left lung is clear.  There are no pathologically enlarged axillary, hilar or mediastinal lymph nodes.  The heart size is normal. There is no pericardial effusion. The thoracic aorta is normal in caliber. Extensive multivessel  coronary artery atherosclerosis.  There is a 2.3 x 1.9 cm hypodense right thyroid mass.  Review of bone windows demonstrates no focal lytic or sclerotic lesions. There is a nondisplaced fracture of the right posterior eleventh rib at the costovertebral junction. There is a displaced fracture of the posterolateral tenth rib. There are displaced fractures involving the lateral and posterior seventh, eighth and ninth ribs. There is a displaced fracture of the right anterolateral sixth rib. The vertebral body heights are maintained and are in normal anatomic alignment. There are anterior bridging osteophytes of the thoracic spine as can be seen with diffuse idiopathic skeletal hyperostosis.  CT ABDOMEN AND PELVIS FINDINGS  The liver demonstrates no focal abnormality. There is no intrahepatic or extrahepatic biliary ductal dilatation. The gallbladder is normal. The spleen demonstrates no focal abnormality.There is a 3.6 x 3.7 cm hypodense, fluid attenuating right renal mass most consistent with a cyst. The kidneys, adrenal glands and pancreas are normal. The bladder is unremarkable.  The stomach, duodenum, small intestine, and large intestine demonstrate no wall thickening or dilatation. There is no pneumoperitoneum, pneumatosis, or portal venous gas. There is no abdominal or pelvic free fluid. There is no lymphadenopathy.  The abdominal aorta is normal in caliber with atherosclerosis.  There are no lytic or sclerotic osseous lesions. There is is degenerative disc disease and facet arthropathy in the lumbar spine. Knee vertebral body heights are maintained and are in normal anatomic alignment.  IMPRESSION: 1. Large right hemothorax. Small right pneumothorax measuring less than 10 percent. Multiple displaced right rib fractures as described above. 2. No acute abdominal or pelvic injury. 3. Multivessel coronary artery atherosclerosis. 4. 2.3 x 1.9 cm hypodense right thyroid mass. If there is further clinical concern, a  dedicated non a margin thyroid ultrasound is recommended for better characterization. These results were called by telephone at the time of interpretation on 02/18/2014 at 10:08 am to [facility] [place], who verbally acknowledged these results.   Electronically Signed   By: Kathreen Devoid   On: 02/18/2014 10:11   Ct Cervical Spine Wo Contrast  02/18/2014   CLINICAL DATA:  Memory loss, head injury after multiple falls in last several days.  EXAM: CT HEAD WITHOUT CONTRAST  CT CERVICAL SPINE WITHOUT CONTRAST  TECHNIQUE: Multidetector CT imaging of the head and cervical spine was performed following the standard protocol without intravenous contrast. Multiplanar CT image reconstructions of the cervical spine were also generated.  COMPARISON:  None.  FINDINGS: CT HEAD FINDINGS  Bony calvarium appears intact. Mild diffuse cortical atrophy is noted. Mild chronic ischemic white matter disease is noted. No mass effect or midline shift is noted. Ventricular size is within normal limits. There is no evidence of mass lesion, hemorrhage or acute infarction.  CT CERVICAL SPINE FINDINGS  No fracture is noted. Minimal grade 1 retrolisthesis is noted at C6-7 secondary to degenerative disc disease at this level. Moderate degenerative disc disease is also noted at C4-5 and C5-6 with anterior osteophyte formation. Incidental note is made of minimal right apical pneumothorax with associated pleural effusion.  IMPRESSION: Mild diffuse cortical atrophy. Mild  chronic ischemic white matter disease. No acute intracranial abnormality seen.  Multilevel degenerative disc disease is noted in the lower cervical spine. No fracture or significant spondylolisthesis is noted.  Incidental note is made of minimal right apical pneumothorax with associated pleural effusion. Please refer to CT scan of chest of same day for further discussion.   Electronically Signed   By: Sabino Dick M.D.   On: 02/18/2014 10:07   Ct Abdomen Pelvis W  Contrast  02/18/2014   CLINICAL DATA:  Patient's following 2-3 times in the past 2 days. Bruising over the right flank.  EXAM: CT CHEST, ABDOMEN, AND PELVIS WITH CONTRAST  TECHNIQUE: Multidetector CT imaging of the chest, abdomen and pelvis was performed following the standard protocol during bolus administration of intravenous contrast.  CONTRAST:  140mL OMNIPAQUE IOHEXOL 300 MG/ML  SOLN  COMPARISON:  None.  FINDINGS: CT CHEST FINDINGS  The central airways are patent. There is a large right hemothorax. There is a small right pneumothorax measuring less than 10%. There is soft tissue emphysema in the right lateral chest wall. The left lung is clear.  There are no pathologically enlarged axillary, hilar or mediastinal lymph nodes.  The heart size is normal. There is no pericardial effusion. The thoracic aorta is normal in caliber. Extensive multivessel coronary artery atherosclerosis.  There is a 2.3 x 1.9 cm hypodense right thyroid mass.  Review of bone windows demonstrates no focal lytic or sclerotic lesions. There is a nondisplaced fracture of the right posterior eleventh rib at the costovertebral junction. There is a displaced fracture of the posterolateral tenth rib. There are displaced fractures involving the lateral and posterior seventh, eighth and ninth ribs. There is a displaced fracture of the right anterolateral sixth rib. The vertebral body heights are maintained and are in normal anatomic alignment. There are anterior bridging osteophytes of the thoracic spine as can be seen with diffuse idiopathic skeletal hyperostosis.  CT ABDOMEN AND PELVIS FINDINGS  The liver demonstrates no focal abnormality. There is no intrahepatic or extrahepatic biliary ductal dilatation. The gallbladder is normal. The spleen demonstrates no focal abnormality.There is a 3.6 x 3.7 cm hypodense, fluid attenuating right renal mass most consistent with a cyst. The kidneys, adrenal glands and pancreas are normal. The bladder is  unremarkable.  The stomach, duodenum, small intestine, and large intestine demonstrate no wall thickening or dilatation. There is no pneumoperitoneum, pneumatosis, or portal venous gas. There is no abdominal or pelvic free fluid. There is no lymphadenopathy.  The abdominal aorta is normal in caliber with atherosclerosis.  There are no lytic or sclerotic osseous lesions. There is is degenerative disc disease and facet arthropathy in the lumbar spine. Knee vertebral body heights are maintained and are in normal anatomic alignment.  IMPRESSION: 1. Large right hemothorax. Small right pneumothorax measuring less than 10 percent. Multiple displaced right rib fractures as described above. 2. No acute abdominal or pelvic injury. 3. Multivessel coronary artery atherosclerosis. 4. 2.3 x 1.9 cm hypodense right thyroid mass. If there is further clinical concern, a dedicated non a margin thyroid ultrasound is recommended for better characterization. These results were called by telephone at the time of interpretation on 02/18/2014 at 10:08 am to [facility], [place], who verbally acknowledged these results.   Electronically Signed   By: Kathreen Devoid   On: 02/18/2014 10:11   Dg Chest Port 1 View  02/20/2014   CLINICAL DATA:  Shortness of breath.  EXAM: PORTABLE CHEST - 1 VIEW  COMPARISON:  04/22/2013 .  FINDINGS: Right chest tube in stable position. Mediastinum and hilar structures are normal. Heart size stable. Persistent right base atelectasis and/or infiltrate. Subsegmental atelectasis left lung base unchanged. No pleural effusion or pneumothorax. Multiple right rib fractures.  IMPRESSION: 1. Right chest tube in stable position.  No pneumothorax. 2. Stable right base atelectasis and/or infiltrate. Stable subsegmental atelectasis left lung base. 3. Multiple right rib fractures.   Electronically Signed   By: Marcello Moores  Register   On: 02/20/2014 07:57   Dg Chest Port 1 View  02/19/2014   CLINICAL DATA:  Traumatic  hemopneumothorax  EXAM: PORTABLE CHEST - 1 VIEW  COMPARISON:  Portable exam 0710 hr compared to 02/18/2014  FINDINGS: RIGHT thoracostomy tube unchanged.  Normal heart size, mediastinal contours and pulmonary vascularity.  Atherosclerotic calcification aorta.  Bibasilar atelectasis.  No significant pleural effusion or pneumothorax identified.  IMPRESSION: Bibasilar atelectasis.   Electronically Signed   By: Lavonia Dana M.D.   On: 02/19/2014 08:13   Dg Chest Port 1 View  02/18/2014   CLINICAL DATA:  Status post fall. Hemopneumothorax on the right. Chest tube placement.  EXAM: PORTABLE CHEST - 1 VIEW  COMPARISON:  CT chest and PA and lateral chest earlier this same day.  FINDINGS: A new right chest tube is in place. Pleural fluid on the right appears decreased. No pneumothorax is identified. Right basilar atelectasis is noted. Multiple right rib fractures are again seen. The left lung is expanded with mild basilar atelectasis noted. Heart size is normal.  IMPRESSION: Some decrease in right pleural fluid after chest tube placement. Negative for pneumothorax.  Right worse than left basilar atelectasis.  Multiple right rib fractures.   Electronically Signed   By: Inge Rise M.D.   On: 02/18/2014 13:08    Anti-infectives: Anti-infectives    None      Assessment/Plan: s/p  CXR in the morning and possibly remove the CT tomorrowl.  LOS: 2 days   Kathryne Eriksson. Dahlia Bailiff, MD, FACS [phone] Trauma Surgeon 02/20/2014

## 2014-02-20 NOTE — Progress Notes (Signed)
  Date: 02/20/2014  Patient name: Eddie [name]  Medical record number: [id number]  Date of birth: 02/18/1933   This patient has been seen and the plan of care was discussed with the house staff. Please see their note for complete details. I concur with their findings with the following additions/corrections: Mr Nusz cont to be in good spirits and was sitting in bed eating. Wife and nephew present. Wants to get up and walk.   1. Large Hemothorax with small <10% pneumothorax on the right, traumatic - PTX has resolved. Dr Hulen Skains will reassess in AM and if improved, might remove chest tube. HgB dropped 13 > 10.4 > 9.4> 9.5. HgB stable past 24 hours. Has no resp sxs other than pain with the rib fractures.  2. Syncope - diff is broad and we have no old records. The most likely cause was a mechanical fall with dementia. Other causes considered and were felt to have been R/O already ot less likely. Obtaining old records.   Diff inc *AMI but EKG and Trop x2 negative so doubt *PE but on pradaxa so doubt *massive CVA but CT negative and no focal abnl *arrythmia on tele and will check strips *electrolyte abnl but labs OK and pt is NOT DM *structural dz like outflow obstruction - would prefer to get old records to see if ECHO done in past *orhtostatic might be most likely cause. Orthostatics once stable.   3. A Fib - Off pradaxa 2/2 bleed. Cont sotolol. Once stable, I would favor restarting anticoag as CHADS 2 score is 2, CHADS VASc 4 so risk 4.2% to 4.8%. He is so physically functional, other than 2 recent falls, that a stroke would be potentially catastrophic.  4. Falls - PT and OT. Have rec SNF vs 24 hr assist. Pt and wife prefer home. Would need home PT / RN / aide. Short term only. Sent [name].  5. Thrombocytopenia - follow. Get old records.  6. Dementia - cont home meds.    Bartholomew Crews, MD 02/20/2014, 2:30 PM

## 2014-02-20 NOTE — Plan of Care (Signed)
Problem: Phase I Progression Outcomes Goal: Voiding-avoid urinary catheter unless indicated Outcome: Completed/Met Date Met:  02/20/14     

## 2014-02-21 ENCOUNTER — Inpatient Hospital Stay (HOSPITAL_COMMUNITY): Payer: Medicare Other

## 2014-02-21 LAB — CBC
HCT: 26 % — ABNORMAL LOW (ref 39.0–52.0)
HEMOGLOBIN: 8.5 g/dL — AB (ref 13.0–17.0)
MCH: 29.8 pg (ref 26.0–34.0)
MCHC: 32.7 g/dL (ref 30.0–36.0)
MCV: 91.2 fL (ref 78.0–100.0)
PLATELETS: 116 10*3/uL — AB (ref 150–400)
RBC: 2.85 MIL/uL — ABNORMAL LOW (ref 4.22–5.81)
RDW: 13.8 % (ref 11.5–15.5)
WBC: 6.6 10*3/uL (ref 4.0–10.5)

## 2014-02-21 LAB — GLUCOSE, CAPILLARY
Glucose-Capillary: 120 mg/dL — ABNORMAL HIGH (ref 70–99)
Glucose-Capillary: 132 mg/dL — ABNORMAL HIGH (ref 70–99)
Glucose-Capillary: 133 mg/dL — ABNORMAL HIGH (ref 70–99)
Glucose-Capillary: 123 mg/dL — ABNORMAL HIGH (ref 70–99)

## 2014-02-21 NOTE — Plan of Care (Signed)
Problem: Phase II Progression Outcomes Goal: Vital signs remain stable Outcome: Completed/Met Date Met:  02/21/14 Goal: IV changed to normal saline lock Outcome: Not Applicable Date Met:  86/76/19 Goal: Obtain order to discontinue catheter if appropriate Outcome: Not Applicable Date Met:  50/93/26

## 2014-02-21 NOTE — Progress Notes (Signed)
Patient ambulated in the hallway 150 feet with RW. Patient tolerated well. Returned to bed with call bell within reach. Will continue to monitor. Glade Nurse, RN

## 2014-02-21 NOTE — Progress Notes (Signed)
Subjective: He is in bed with a sitter and 3 family members.  He has some significant dementia.  Not  Up much and not moving allot, PT and OT are seeing him.   Pleuravac is full so he can't drain any more.. Objective: Vital signs in last 24 hours: Temp:  [98.1 F (36.7 C)-100 F (37.8 C)] 98.3 F (36.8 C) (12/05 0505) Pulse Rate:  [52-68] 65 (12/05 0505) Resp:  [17-18] 17 (12/05 0505) BP: (102-126)/(41-56) 126/56 mmHg (12/05 0505) SpO2:  [95 %-98 %] 96 % (12/05 0505) Weight:  [68.811 kg (151 lb 11.2 oz)] 68.811 kg (151 lb 11.2 oz) (12/05 0505) Last BM Date: 02/18/14  100 ml from CT yesterday, down from 300 ml day prior. Afebrile, Tm 100, VSS H/h down ot 8.5/26 5% right apical Ptx, bilat basilar atelectasis R>L   Intake/Output from previous day: 12/04 0701 - 12/05 0700 In: 120 [P.O.:120] Out: 1900 [Urine:1800; Chest Tube:100] Intake/Output this shift: Total I/O In: -  Out: 50 [Chest Tube:50]  General appearance: alert, cooperative and no distress Resp: decreased BS on right  Lab Results:   Recent Labs  02/20/14 0336 02/21/14 0507  WBC 8.5 6.6  HGB 9.5* 8.5*  HCT 28.6* 26.0*  PLT 107* 116*    BMET  Recent Labs  02/19/14 0347  NA 140  K 4.3  CL 107  CO2 22  GLUCOSE 137*  BUN 19  CREATININE 0.93  CALCIUM 8.0*   PT/INR No results for input(s): LABPROT, INR in the last 72 hours.   Recent Labs Lab 02/18/14 0723  AST 30  ALT 25  ALKPHOS 63  BILITOT 0.9  PROT 6.4  ALBUMIN 3.7     Lipase  No results found for: LIPASE   Studies/Results: Dg Chest 2 View  02/21/2014   CLINICAL DATA:  Right chest tube  EXAM: CHEST  2 VIEW  COMPARISON:  Yesterday  FINDINGS: Right chest tube is stable with a a small less than 5% right apical pneumothorax. Bibasilar atelectasis right greater than left. No left pneumothorax. Upper normal heart size.  IMPRESSION: Tiny less than 5% right apical pneumothorax. Bibasilar atelectasis right great the left   Electronically  Signed   By: Maryclare Bean M.D.   On: 02/21/2014 08:18   Dg Chest Port 1 View  02/20/2014   CLINICAL DATA:  Shortness of breath.  EXAM: PORTABLE CHEST - 1 VIEW  COMPARISON:  04/22/2013 .  FINDINGS: Right chest tube in stable position. Mediastinum and hilar structures are normal. Heart size stable. Persistent right base atelectasis and/or infiltrate. Subsegmental atelectasis left lung base unchanged. No pleural effusion or pneumothorax. Multiple right rib fractures.  IMPRESSION: 1. Right chest tube in stable position.  No pneumothorax. 2. Stable right base atelectasis and/or infiltrate. Stable subsegmental atelectasis left lung base. 3. Multiple right rib fractures.   Electronically Signed   By: Marcello Moores  Register   On: 02/20/2014 07:57    Medications: . atorvastatin  80 mg Oral q1800  . B-complex with vitamin C  1 tablet Oral Daily  . donepezil  10 mg Oral Daily  . ezetimibe  10 mg Oral Daily  . fluticasone  2 spray Each Nare Daily  . gabapentin  400 mg Oral Daily  . magnesium oxide  400 mg Oral Daily  . Memantine HCl ER  21 mg Oral Q1400  . sodium chloride  3 mL Intravenous Q12H  . sotalol  40 mg Oral BID    Assessment/Plan Falls with multiple right rib fracture/s/p  right chest tube placement 02/18/14. Hemopneumothorax AODM Afib - chronic anitcoagulation on Pradaxa.   Dementia CAD/Carotid stenosis Hypertension  SCD for DVT prophylaxis   Plan:  Replace pleuravac, and watch drainage today.  Repeat CXR in AM.  Work on ambulation, IS and OOB.       LOS: 3 days    Eddie [name] 02/21/2014

## 2014-02-21 NOTE — Progress Notes (Signed)
Subjective:   Day of hospitalization: 3  VSS.  No overnight events.  Pt is sitting up in the chair visiting with a friend in the room.  He denies any CP or SOB. He has been up walking in the halls.  Reports some dizziness at times.  Otherwise no complaints.  CT still in place, 173ml output yesterday.     Objective:   Vital signs in last 24 hours: Filed Vitals:   02/20/14 0335 02/20/14 1419 02/20/14 2025 02/21/14 0505  BP: 142/54 102/41 124/46 126/56  Pulse: 64 52 68 65  Temp: 98.7 F (37.1 C) 98.1 F (36.7 C) 100 F (37.8 C) 98.3 F (36.8 C)  TempSrc: Oral Oral Oral Axillary  Resp: 16 18 17 17   Weight: 154 lb 9.6 oz (70.126 kg)   151 lb 11.2 oz (68.811 kg)  SpO2: 97% 98% 95% 96%    Weight: Filed Weights   02/19/14 0610 02/20/14 0335 02/21/14 0505  Weight: 156 lb 6.4 oz (70.943 kg) 154 lb 9.6 oz (70.126 kg) 151 lb 11.2 oz (68.811 kg)    I/Os:  Intake/Output Summary (Last 24 hours) at 02/21/14 0542 Last data filed at 02/21/14 0231  Gross per 24 hour  Intake    120 ml  Output   1900 ml  Net  -1780 ml    Physical Exam: Constitutional: Vital signs reviewed.  Patient is sitting up in no acute distress and cooperative with exam.   HEENT: [place]/AT; PERRL, EOMI, conjunctivae normal, no scleral icterus  Cardiovascular: RRR, 4/6 systolic murmur radiating to carotids  Pulmonary/Chest: normal respiratory effort, no accessory muscle use, CTAB, no wheezes, rales, or rhonchi Abdominal: Soft. +BS, NT/ND Neurological: A&O x3, CN II-XII grossly intact; non-focal exam Extremities: 2+DP b/l, no C/C/E  Skin: Warm, dry and intact. No rash  Lab Results:  BMP:  Recent Labs Lab 02/18/14 0723 02/19/14 0347  NA 143 140  K 5.1 4.3  CL 106 107  CO2 27 22  GLUCOSE 132* 137*  BUN 20 19  CREATININE 0.92 0.93  CALCIUM 9.0 8.0*  MG  --  2.1   CBC:  Recent Labs Lab 02/19/14 0347 02/20/14 0336  WBC 9.7 8.5  NEUTROABS 7.2 6.1  HGB 9.4* 9.5*  HCT 28.3* 28.6*  MCV 92.5 93.2    PLT 117* 107*    Coagulation: No results for input(s): LABPROT, INR in the last 168 hours.  CBG:            Recent Labs Lab 02/19/14 1607 02/19/14 2052 02/20/14 0652 02/20/14 1109 02/20/14 1609 02/20/14 2108  GLUCAP 155* 152* 117* 144* 116* 137*           HA1C:       Recent Labs Lab 02/18/14 1416  HGBA1C 5.8*   LFTs:  Recent Labs Lab 02/18/14 0723  AST 30  ALT 25  ALKPHOS 63  BILITOT 0.9  PROT 6.4  ALBUMIN 3.7   Lactic Acid/Procalcitonin:  Recent Labs Lab 02/18/14 0723  LATICACIDVEN 0.7   Cardiac Enzymes:  Recent Labs Lab 02/18/14 1416  TROPONINI <0.30    EKG: EKG Interpretation  Date/Time:  Wednesday February 18 2014 07:49:26 EST Ventricular Rate:  49 PR Interval:  162 QRS Duration: 107 QT Interval:  514 QTC Calculation: 464 R Axis:   32 Text Interpretation:  Sinus bradycardia RSR' in V1 or V2, right VCD or RVH Baseline wander No prior for comparison Confirmed by Mingo Amber  MD, BLAIR ([id number]) on 02/18/2014 11:37:27 AM  Urinalysis:  Recent Labs Lab 02/18/14 1012  COLORURINE YELLOW  LABSPEC 1.042*  PHURINE 5.0  GLUCOSEU NEGATIVE  HGBUR NEGATIVE  BILIRUBINUR NEGATIVE  KETONESUR 15*  PROTEINUR NEGATIVE  UROBILINOGEN 0.2  NITRITE NEGATIVE  LEUKOCYTESUR NEGATIVE   Studies/Results: Dg Chest Port 1 View  02/20/2014   CLINICAL DATA:  Shortness of breath.  EXAM: PORTABLE CHEST - 1 VIEW  COMPARISON:  04/22/2013 .  FINDINGS: Right chest tube in stable position. Mediastinum and hilar structures are normal. Heart size stable. Persistent right base atelectasis and/or infiltrate. Subsegmental atelectasis left lung base unchanged. No pleural effusion or pneumothorax. Multiple right rib fractures.  IMPRESSION: 1. Right chest tube in stable position.  No pneumothorax. 2. Stable right base atelectasis and/or infiltrate. Stable subsegmental atelectasis left lung base. 3. Multiple right rib fractures.   Electronically Signed   By: Marcello Moores  Register   On:  02/20/2014 07:57   Dg Chest Port 1 View  02/19/2014   CLINICAL DATA:  Traumatic hemopneumothorax  EXAM: PORTABLE CHEST - 1 VIEW  COMPARISON:  Portable exam 0710 hr compared to 02/18/2014  FINDINGS: RIGHT thoracostomy tube unchanged.  Normal heart size, mediastinal contours and pulmonary vascularity.  Atherosclerotic calcification aorta.  Bibasilar atelectasis.  No significant pleural effusion or pneumothorax identified.  IMPRESSION: Bibasilar atelectasis.   Electronically Signed   By: Lavonia Dana M.D.   On: 02/19/2014 08:13    Medications:  Scheduled Meds: . atorvastatin  80 mg Oral q1800  . B-complex with vitamin C  1 tablet Oral Daily  . donepezil  10 mg Oral Daily  . ezetimibe  10 mg Oral Daily  . fluticasone  2 spray Each Nare Daily  . gabapentin  400 mg Oral Daily  . magnesium oxide  400 mg Oral Daily  . Memantine HCl ER  21 mg Oral Q1400  . sodium chloride  3 mL Intravenous Q12H  . sotalol  40 mg Oral BID   Continuous Infusions:  PRN Meds: HYDROcodone-acetaminophen  Antibiotics: Antibiotics Given (last 72 hours)    None      Day of Hospitalization: 3  Consults: Treatment Team:  Trauma Md, MD  Assessment/Plan:   Principal Problem:   Hemothorax with pneumothorax, traumatic Active Problems:   Atrial fibrillation   Dementia   Hemothorax, right   Rib fractures   Abnormality of gait   Hypotension   Thrombocytopenia   Thyroid mass   Dyslipidemia  Right-sided hemothorax with small pneumothorax  CT output: 188ml yesterday.  Trauma will repeat CXR tomorrow, CT remains in for now.  Hgb continues to trend down 13.0-->10.4-->9.4-->9.5-->8.5.   -repeat cbc in AM -continue IS -CXR in AM  -norco 1-2 tablets q4 prn pain -PT/OT   Fragility Fractures, Multiple Pt has no h/o smoking.  Likely has AS which may be contributing.  -check vit D level -DEXA as outpatient  -per above  Recurrent Falls Pt likely with moderate symptomatic AS according to previous physician  notes from PCP in [place].  This may be contributing to recurrent falls.     -repeat TTE  -PT/OT  Paroxysmal Atrial Fibrillation Bradycardic in NSR currently. Asymptomatic. Was given pradaxa-reversing agent in ED (Prixbind). -hold pradaxa and ASA  -continue Sotalol 40 mg bid   Thrombocytopenia 130-->120-->118 -monitor, no offending meds, dilutional?  Incidental Thyroid Mass <3 cm (2.3 x 1.9 cm right thyroid mass), asymptomatic, normal TSH. --f/u 6 months  Dementia -memantine ER 21 mg, aricept 10 mg continued  Dyslipidemia/CAD/Carotid Artery Stenosis -continued lipitor 80 mg, zetia -hold ASA  F/E/N Fluids- None Electrolytes- Replete as needed, d/c mag oxide  Nutrition- Carb modified diet   VTE PPx  SCDs   Disposition Disposition is deferred, awaiting improvement of current medical problems.   LOS: 3 days   Jones Bales, MD PGY-2, Internal Medicine Teaching Service 02/21/2014, 5:42 AM

## 2014-02-21 NOTE — Plan of Care (Signed)
Problem: Phase I Progression Outcomes Goal: Pain controlled with appropriate interventions Outcome: Completed/Met Date Met:  02/21/14 Goal: OOB as tolerated unless otherwise ordered Outcome: Completed/Met Date Met:  02/21/14 OT/PT working with patient Goal: Initial discharge plan identified Outcome: Completed/Met Date Met:  02/21/14 Goal: Hemodynamically stable Outcome: Completed/Met Date Met:  02/21/14

## 2014-02-21 NOTE — Progress Notes (Signed)
Clinical Social Work Department CLINICAL SOCIAL WORK PLACEMENT NOTE 02/21/2014  Patient:  Eddie [name], Eddie [name]  Account Number:  [id number] Admit date:  02/18/2014  Clinical Social Worker:  Sueanne Margarita, LCSW  Date/time:  02/21/2014 02:00 PM  Clinical Social Work is seeking post-discharge placement for this patient at the following level of care:   [place]   (*CSW will update this form in Epic as items are completed)   02/20/2014  Patient/family provided with [place] Department of Clinical Social Work's list of facilities offering this level of care within the geographic area requested by the patient (or if unable, by the patient's family).  02/20/2014  Patient/family informed of their freedom to choose among providers that offer the needed level of care, that participate in Medicare, Medicaid or managed care program needed by the patient, have an available bed and are willing to accept the patient.  02/20/2014  Patient/family informed of [facility]' ownership interest in [facility], as well as of the fact that they are under no obligation to receive care at this facility.  PASARR submitted to EDS on 02/21/2014 PASARR number received on 02/21/2014  FL2 transmitted to all facilities in geographic area requested by pt/family on  02/21/2014 FL2 transmitted to all facilities within larger geographic area on   Patient informed that his/her managed care company has contracts with or will negotiate with  certain facilities, including the following:     Patient/family informed of bed offers received:   Patient chooses bed at  Physician recommends and patient chooses bed at    Patient to be transferred to  on   Patient to be transferred to facility by  Patient and family notified of transfer on  Name of family member notified:    The following physician request were entered in Epic:   Additional Comments:

## 2014-02-22 ENCOUNTER — Inpatient Hospital Stay (HOSPITAL_COMMUNITY): Payer: Medicare Other

## 2014-02-22 DIAGNOSIS — E785 Hyperlipidemia, unspecified: Secondary | ICD-10-CM

## 2014-02-22 DIAGNOSIS — I480 Paroxysmal atrial fibrillation: Secondary | ICD-10-CM

## 2014-02-22 DIAGNOSIS — R221 Localized swelling, mass and lump, neck: Secondary | ICD-10-CM

## 2014-02-22 DIAGNOSIS — I6529 Occlusion and stenosis of unspecified carotid artery: Secondary | ICD-10-CM

## 2014-02-22 DIAGNOSIS — S2241XA Multiple fractures of ribs, right side, initial encounter for closed fracture: Secondary | ICD-10-CM

## 2014-02-22 DIAGNOSIS — I359 Nonrheumatic aortic valve disorder, unspecified: Secondary | ICD-10-CM

## 2014-02-22 DIAGNOSIS — I2510 Atherosclerotic heart disease of native coronary artery without angina pectoris: Secondary | ICD-10-CM

## 2014-02-22 DIAGNOSIS — I959 Hypotension, unspecified: Secondary | ICD-10-CM

## 2014-02-22 LAB — GLUCOSE, CAPILLARY
GLUCOSE-CAPILLARY: 126 mg/dL — AB (ref 70–99)
Glucose-Capillary: 124 mg/dL — ABNORMAL HIGH (ref 70–99)
Glucose-Capillary: 127 mg/dL — ABNORMAL HIGH (ref 70–99)

## 2014-02-22 LAB — CBC
HEMATOCRIT: 26.4 % — AB (ref 39.0–52.0)
HEMOGLOBIN: 8.9 g/dL — AB (ref 13.0–17.0)
MCH: 30.9 pg (ref 26.0–34.0)
MCHC: 33.7 g/dL (ref 30.0–36.0)
MCV: 91.7 fL (ref 78.0–100.0)
Platelets: 154 10*3/uL (ref 150–400)
RBC: 2.88 MIL/uL — AB (ref 4.22–5.81)
RDW: 13.8 % (ref 11.5–15.5)
WBC: 7.6 10*3/uL (ref 4.0–10.5)

## 2014-02-22 MED ORDER — HYDROCODONE-ACETAMINOPHEN 5-325 MG PO TABS
1.0000 | ORAL_TABLET | ORAL | Status: DC
  Administered 2014-02-22 – 2014-02-24 (×15): 1 via ORAL
  Filled 2014-02-22 (×15): qty 1

## 2014-02-22 NOTE — Progress Notes (Signed)
  Echocardiogram 2D Echocardiogram has been performed.  Eddie [name] 02/22/2014, 11:14 AM

## 2014-02-22 NOTE — Progress Notes (Signed)
Subjective: X ray is pending, he had 400 out of Ct, and it is a bloody colored fluid.  Tube was also taped and bent at a right angle.  I redressed the whole thing and unkinked tube.  I then got him up in the chair, he didn't seem to drain allot with getting up.   Objective: Vital signs in last 24 hours: Temp:  [98.6 F (37 C)-99 F (37.2 C)] 99 F (37.2 C) (12/06 0534) Pulse Rate:  [58-83] 58 (12/06 0534) Resp:  [18-19] 18 (12/06 0534) BP: (104-124)/(43-61) 104/43 mmHg (12/06 0534) SpO2:  [93 %-96 %] 95 % (12/06 0534) Weight:  [71.169 kg (156 lb 14.4 oz)] 71.169 kg (156 lb 14.4 oz) (12/06 0534) Last BM Date: 02/18/14 400 from the NG Afebrile, VSS CBC is  stable CXR is pending   Intake/Output from previous day: 12/05 0701 - 12/06 0700 In: 300 [P.O.:300] Out: 1750 [Urine:1350; Chest Tube:400] Intake/Output this shift:    General appearance: alert, cooperative and no distress Resp: BS down in bases Chest wall: no tenderness, right sided chest wall tenderness, he has marked brusing right side.  Also some left buttocks from fall.  Lab Results:   Recent Labs  02/21/14 0507 02/22/14 0526  WBC 6.6 7.6  HGB 8.5* 8.9*  HCT 26.0* 26.4*  PLT 116* 154    BMET No results for input(s): NA, K, CL, CO2, GLUCOSE, BUN, CREATININE, CALCIUM in the last 72 hours. PT/INR No results for input(s): LABPROT, INR in the last 72 hours.   Recent Labs Lab 02/18/14 0723  AST 30  ALT 25  ALKPHOS 63  BILITOT 0.9  PROT 6.4  ALBUMIN 3.7     Lipase  No results found for: LIPASE   Studies/Results: Dg Chest 2 View  02/21/2014   CLINICAL DATA:  Right chest tube  EXAM: CHEST  2 VIEW  COMPARISON:  Yesterday  FINDINGS: Right chest tube is stable with a a small less than 5% right apical pneumothorax. Bibasilar atelectasis right greater than left. No left pneumothorax. Upper normal heart size.  IMPRESSION: Tiny less than 5% right apical pneumothorax. Bibasilar atelectasis right great the left    Electronically Signed   By: Maryclare Bean M.D.   On: 02/21/2014 08:18   Dg Chest Port 1 View  02/20/2014   CLINICAL DATA:  Shortness of breath.  EXAM: PORTABLE CHEST - 1 VIEW  COMPARISON:  04/22/2013 .  FINDINGS: Right chest tube in stable position. Mediastinum and hilar structures are normal. Heart size stable. Persistent right base atelectasis and/or infiltrate. Subsegmental atelectasis left lung base unchanged. No pleural effusion or pneumothorax. Multiple right rib fractures.  IMPRESSION: 1. Right chest tube in stable position.  No pneumothorax. 2. Stable right base atelectasis and/or infiltrate. Stable subsegmental atelectasis left lung base. 3. Multiple right rib fractures.   Electronically Signed   By: Marcello Moores  Register   On: 02/20/2014 07:57    Medications: . atorvastatin  80 mg Oral q1800  . B-complex with vitamin C  1 tablet Oral Daily  . donepezil  10 mg Oral Daily  . ezetimibe  10 mg Oral Daily  . fluticasone  2 spray Each Nare Daily  . gabapentin  400 mg Oral Daily  . Memantine HCl ER  21 mg Oral Q1400  . sodium chloride  3 mL Intravenous Q12H  . sotalol  40 mg Oral BID    Assessment/Plan Falls with multiple right rib fracture/s/p right chest tube placement 02/18/14. Hemopneumothorax AODM Afib - chronic  anitcoagulation on Pradaxa.  Dementia CAD/Carotid stenosis Hypertension  SCD for DVT prophylaxis   Plan:  Wait on film, I think the biggest question is do we  Watch the effusion/drainage with or without the tube in.   I will discuss with Dr. Ninfa Linden.  LOS: 4 days    Eddie [name] 02/22/2014

## 2014-02-22 NOTE — Progress Notes (Signed)
Subjective: Eddie [name] feels well this morning. He did not have a sitter overnight, but managed without any problems. Eddie [name] requests that a light be left on in the room for reorientation overnight.  CXR much improved with plan for d/c chest tube tomorrow.  Objective: Vital signs in last 24 hours: Filed Vitals:   02/21/14 0505 02/21/14 1353 02/21/14 2018 02/22/14 0534  BP: 126/56 124/61 124/49 104/43  Pulse: 65 83 63 58  Temp: 98.3 F (36.8 C) 98.6 F (37 C) 98.6 F (37 C) 99 F (37.2 C)  TempSrc: Axillary Oral Oral Oral  Resp: 17 19 18 18   Height: 6' (1.829 m)     Weight: 151 lb 11.2 oz (68.811 kg)   156 lb 14.4 oz (71.169 kg)  SpO2: 96% 93% 96% 95%   Weight change: 5 lb 3.2 oz (2.359 kg)  Intake/Output Summary (Last 24 hours) at 02/22/14 0706 Last data filed at 02/22/14 7867  Gross per 24 hour  Intake    300 ml  Output   1750 ml  Net  -1450 ml   Chest Tube 200 ml out yesterday.  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) Neuro: A&Ox2 (knows name, hospital, [name]'s name, cannot identify who drew him pictures that are hanging in Eddie room, not oriented to time), CN 2-12 grossly intact, moving all 4 extremities  Lab Results: Basic Metabolic Panel:  Recent Labs Lab 02/18/14 0723 02/19/14 0347  NA 143 140  K 5.1 4.3  CL 106 107  CO2 27 22  GLUCOSE 132* 137*  BUN 20 19  CREATININE 0.92 0.93  CALCIUM 9.0 8.0*  MG  --  2.1   Liver Function Tests:  Recent Labs Lab  02/18/14 0723  AST 30  ALT 25  ALKPHOS 63  BILITOT 0.9  PROT 6.4  ALBUMIN 3.7  CBC:  Recent Labs Lab 02/19/14 0347 02/20/14 0336 02/21/14 0507 02/22/14 0526  WBC 9.7 8.5 6.6 7.6  NEUTROABS 7.2 6.1  --   --   HGB 9.4* 9.5* 8.5* 8.9*  HCT 28.3* 28.6* 26.0* 26.4*  MCV 92.5 93.2 91.2 91.7  PLT 117* 107* 116* 154   Cardiac Enzymes:  Recent Labs Lab 02/18/14 1416  TROPONINI <0.30   CBG:  Recent Labs Lab 02/20/14 2108 02/21/14 0607 02/21/14 1119 02/21/14 1632 02/21/14 2150 02/22/14 0605  GLUCAP 137* 120* 132* 133* 123* 126*   Hemoglobin A1C:  Recent Labs Lab 02/18/14 1416  HGBA1C 5.8*   Thyroid Function Tests:  Recent Labs Lab 02/18/14 1416  TSH 0.924   Urinalysis:  Recent Labs Lab 02/18/14 1012  COLORURINE YELLOW  LABSPEC 1.042*  PHURINE 5.0  GLUCOSEU NEGATIVE  HGBUR NEGATIVE  BILIRUBINUR NEGATIVE  KETONESUR 15*  PROTEINUR NEGATIVE  UROBILINOGEN 0.2  NITRITE NEGATIVE  LEUKOCYTESUR NEGATIVE    Micro Results: No results found for this or any previous visit (from the past 240 hour(s)). Studies/Results: Dg Chest 2 View  02/21/2014   CLINICAL DATA:  Right chest tube  EXAM: CHEST  2 VIEW  COMPARISON:  Yesterday  FINDINGS: Right  chest tube is stable with a a small less than 5% right apical pneumothorax. Bibasilar atelectasis right greater than left. No left pneumothorax. Upper normal heart size.  IMPRESSION: Tiny less than 5% right apical pneumothorax. Bibasilar atelectasis right great the left   Electronically Signed   By: Maryclare Bean M.D.   On: 02/21/2014 08:18   Dg Chest Port 1 View  02/20/2014   CLINICAL DATA:  Shortness of breath.  EXAM: PORTABLE CHEST - 1 VIEW  COMPARISON:  04/22/2013 .  FINDINGS: Right chest tube in stable position. Mediastinum and hilar structures are normal. Heart size stable. Persistent right base atelectasis and/or infiltrate. Subsegmental atelectasis left lung base unchanged. No pleural effusion or pneumothorax. Multiple  right rib fractures.  IMPRESSION: 1. Right chest tube in stable position.  No pneumothorax. 2. Stable right base atelectasis and/or infiltrate. Stable subsegmental atelectasis left lung base. 3. Multiple right rib fractures.   Electronically Signed   By: Marcello Moores  Register   On: 02/20/2014 07:57   Medications: I have reviewed the patient's current medications. Scheduled Meds: . atorvastatin  80 mg Oral q1800  . B-complex with vitamin C  1 tablet Oral Daily  . donepezil  10 mg Oral Daily  . ezetimibe  10 mg Oral Daily  . fluticasone  2 spray Each Nare Daily  . gabapentin  400 mg Oral Daily  . Memantine HCl ER  21 mg Oral Q1400  . sodium chloride  3 mL Intravenous Q12H  . sotalol  40 mg Oral BID   Continuous Infusions:   ECHO 06/24/2013: EF 60% with mild concentric LV hypertrophy, moderate fibrocalcific changes of the MV. Trace MR. Severely sclerotic and moderately stenotic appearing trileaflet aortic valve. The left ventricular outflow tract velocity is 0.8 m/s and the aortic velocity is 3.7 m/s. Leads to a peak gradient of 56 mmHg and a mean of 34. Valve area calculated to be 0.7 cm2. Mild aortic insufficiency.  Impression: Based on doppler data, moderate aortic stenosis, however, based upon the continuity equation, the patient has severe to perhaps critical aortic stenosis. Visual inspection is less impressive. However, patient does have mild LV hypertrophy with preserved diastolic dysfunction. MV has degenerative changes but functions normally. The MV regurg as described is not significant.   PRN Meds:.HYDROcodone-acetaminophen Assessment/Plan: Principal Problem:   Hemothorax with pneumothorax, traumatic Active Problems:   Atrial fibrillation   Dementia   Hemothorax, right   Rib fractures   Abnormality of gait   Hypotension   Thrombocytopenia   Thyroid mass   Dyslipidemia  80 year old man on home Pradaxa with traumatic fall with hemothorax, pneumothorax and rib fractures on the right  who is now status post chest tube placement and Pradaxa-reversal with Praxbind.  Large, Right-Sided Traumatic Hemothorax: There was initially a small pneumothorax on the right that has resolved on CXR. Chest tube is in place on waterseal; pleur-evac replaced yesterday. Hemoglobin trending down 13.0-->10.4-->9.4-->9.5-->8.5-->8.9. Some pain, but not interfering with Eddie breathing. - Appreciate trauma recs - Likely removal of chest tube tomorrow am - Incentive spirometry - O2 PRN - Norco/Vicodin 1-2 tablets q4 now scheduled for pain  Fragility Fractures, Multiple: Right ribs. - Incentive spirometry and pain control as above - PT reordered, as only seen on Thursday; encourage mobilization - Outpatient dexa scan  Recurrent Falls: Unclear from history whether fall was mechanical in the context of dementia or whether patient may have "blacked out" prior to the fall after rising from a chair. However, patient records suggest that  he has moderate symptomatic aortic stenosis (per echo results 06/2013 in [place]). PE is not likely, as patient was on pradaxa. Had a massive CVA in past, but CT now negative with no focal abnormalities. He could have had an acute MI, but the patient's EKG and troponins were negative on admission. Eddie labs are not showing electrolyte abnormalities. It is very possible that this was a mechanical fall or due to orthostatic hypotension. Most likely AS or orthostasis contributing. - TTE today - Orthostatics pending  - PT and OT   Thrombocytopenia: 130-->120-->118-->117-->107-->116-->154. Appears to have resolved. Platelets were 140 per PCP records in 11/2013. I cannot find thrombocytopenia as a side effect of Praxbind. He is on no other medications that are known to cause thrombocytopenia; simvastatin can cause thrombocytopenia, and he is on atorvastatin. Haloperidol can cause it, and it was mentioned in early notes that this could be given if necessary to the patient; however, it  was never given. May have been dilutional. - Continue to trend  Paroxysmal Atrial Fibrillation: Bradycardic in NSR currently. Asymptomatic. Was given Pradaxa-reversing agent in ED (Praxbind). CHADS 2 score is 2; CHADS VASc is 4.  - Continue to hold Pradaxa and Aspirin 81 - Consider restarting anticoagulation once stable - Monitor via tele - Continue Sotalol 40 mg bid   Hypotension: Currently 104/43. Has been hypo to normotensive. - Monitor BP closely  Incidental Thyroid Mass: Smaller than 3 cm diameter (2.3 x 1.9 cm right thyroid mass), asymptomatic, TSH WNL. - No intervention needed at this time - Recommend f/u in 6 months  Dementia:  - Memantine ER 21 mg, Aricept 10 mg continued - Per EMS tendency to be aggressive though [name] declines this; patient had sitter for first several days, now d/c  Dyslipidemia/CAD/Carotid Artery Stenosis: [name] states that patient had CEA on the left and doctors were "watching Eddie right" carotid artery. Can obtain records from Dr. Luvenia Heller if necessary. - Continued Lipitor 80 mg, Zetia - Hold aspirin for now  Diet:  - H/H   DVT Ppx:  - Hold anticoagulation - SCDs only   Dispo: Disposition is deferred at this time, awaiting improvement of current medical problems.  Anticipated discharge in approximately 1-2 day(s). Patient lives alone with Eddie [name], who cannot manage Eddie medical problems at this time. Looking into 24 hour assist ([name]'s preference) with PT / RN / and aide vs SNF. Given information on [facility]. - Request that CM visit with the patient and Eddie [name] regarding home help.  The patient does not have a current PCP (No Pcp Per Patient) and does not need an [facility] hospital follow-up appointment after discharge. Patient would like to establish care (new to the area) with Dr. Minus Liberty.  The patient does have transportation limitations that hinder transportation to clinic appointments.  .Services Needed at time of discharge: Y = Yes, Blank = No PT:    OT:   RN:   Equipment:   Other:     LOS: 4 days   Drucilla Schmidt, MD 02/22/2014, 7:06 AM

## 2014-02-22 NOTE — Progress Notes (Signed)
  Date: 02/22/2014  Patient name: Eddie [name]  Medical record number: [id number]  Date of birth: 12/15/1932   This patient has been seen and the plan of care was discussed with the house staff. Please see their note for complete details. I concur with their findings with the following additions/corrections: His wife did most of talking. Dr Ninfa Linden came in and said CXR better and possible CT removal in AM. PT not been in since the 3rd. Pain is well controlled when he sits still but increases when he moves and he freq will refuse a pain pill. SNF versus home still up in air.  Plan 1. Re-engage PT - needs to be active QD, multiple times each day 2. Sch opioids 3. F/U ECHO - h/o AS and possible recent syncope 4. Cont to follow HgB - low but stable now 5. Cont to discuss SNF vs home 6. Cont to discuss anti-coag   His wife, [name], told me that 2 months ago she was feeling depressed and worried whether she was making right choice to move to [place]. Her PCP started her on Zoloft, lowest dose possible, to ease the transition. She has been out 3 days now. Weepy yesterday. Felt the med did help. She doesn't yet have a PCP. I am not her PCP but she and her husband come as a team. He has dementia so relies on her. She has mobility issues so relies on him. So in order to maximum the chance of his tx success, I need her at her best. Therefore, I provided her a paper handwritten copy of Zoloft 25 mg 1 QHS 3 month supply. I have no place to document this other than his chart.  Bartholomew Crews, MD 02/22/2014, 9:41 AM

## 2014-02-22 NOTE — Plan of Care (Signed)
Problem: Phase III Progression Outcomes Goal: Pain controlled on oral analgesia Outcome: Completed/Met Date Met:  02/22/14 Goal: Voiding independently Outcome: Completed/Met Date Met:  02/22/14 Goal: Foley discontinued Outcome: Completed/Met Date Met:  02/22/14

## 2014-02-23 ENCOUNTER — Inpatient Hospital Stay (HOSPITAL_COMMUNITY): Payer: Medicare Other

## 2014-02-23 LAB — TROPONIN I: Troponin I: <0.3 ng/mL (ref <0.30)

## 2014-02-23 LAB — BASIC METABOLIC PANEL
ANION GAP: 11 (ref 5–15)
BUN: 16 mg/dL (ref 6–23)
CALCIUM: 8.5 mg/dL (ref 8.4–10.5)
CHLORIDE: 101 meq/L (ref 96–112)
CO2: 26 mEq/L (ref 19–32)
CREATININE: 0.93 mg/dL (ref 0.50–1.35)
GFR, EST NON AFRICAN AMERICAN: 78 mL/min — AB (ref >90)
Glucose, Bld: 121 mg/dL — ABNORMAL HIGH (ref 70–99)
Potassium: 4.1 mEq/L (ref 3.7–5.3)
Sodium: 138 mEq/L (ref 137–147)

## 2014-02-23 LAB — CBC
HEMATOCRIT: 26.9 % — AB (ref 39.0–52.0)
Hemoglobin: 8.8 g/dL — ABNORMAL LOW (ref 13.0–17.0)
MCH: 29.5 pg (ref 26.0–34.0)
MCHC: 32.7 g/dL (ref 30.0–36.0)
MCV: 90.3 fL (ref 78.0–100.0)
Platelets: 189 10*3/uL (ref 150–400)
RBC: 2.98 MIL/uL — ABNORMAL LOW (ref 4.22–5.81)
RDW: 13.9 % (ref 11.5–15.5)
WBC: 7.9 10*3/uL (ref 4.0–10.5)

## 2014-02-23 LAB — GLUCOSE, CAPILLARY
GLUCOSE-CAPILLARY: 142 mg/dL — AB (ref 70–99)
GLUCOSE-CAPILLARY: 131 mg/dL — AB (ref 70–99)
Glucose-Capillary: 105 mg/dL — ABNORMAL HIGH (ref 70–99)
Glucose-Capillary: 112 mg/dL — ABNORMAL HIGH (ref 70–99)

## 2014-02-23 LAB — MAGNESIUM: Magnesium: 2 mg/dL (ref 1.5–2.5)

## 2014-02-23 LAB — VITAMIN D 25 HYDROXY (VIT D DEFICIENCY, FRACTURES): Vit D, 25-Hydroxy: 26 ng/mL — ABNORMAL LOW (ref 30–100)

## 2014-02-23 MED ORDER — DOCUSATE SODIUM 100 MG PO CAPS
100.0000 mg | ORAL_CAPSULE | Freq: Two times a day (BID) | ORAL | Status: DC
  Administered 2014-02-23 – 2014-02-24 (×3): 100 mg via ORAL
  Filled 2014-02-23 (×3): qty 1

## 2014-02-23 MED ORDER — POLYETHYLENE GLYCOL 3350 17 G PO PACK
17.0000 g | PACK | Freq: Every day | ORAL | Status: DC | PRN
  Filled 2014-02-23: qty 1

## 2014-02-23 MED ORDER — METOPROLOL TARTRATE 1 MG/ML IV SOLN
INTRAVENOUS | Status: AC
  Administered 2014-02-23: 5 mg
  Filled 2014-02-23: qty 5

## 2014-02-23 MED ORDER — MEMANTINE HCL ER 7 MG PO CP24
28.0000 mg | ORAL_CAPSULE | Freq: Every day | ORAL | Status: DC
  Administered 2014-02-24: 28 mg via ORAL
  Filled 2014-02-23: qty 4

## 2014-02-23 MED ORDER — METOPROLOL TARTRATE 1 MG/ML IV SOLN
2.5000 mg | INTRAVENOUS | Status: DC | PRN
  Administered 2014-02-23 (×2): 2.5 mg via INTRAVENOUS
  Filled 2014-02-23 (×2): qty 5

## 2014-02-23 MED FILL — Medication: Qty: 2 | Status: AC

## 2014-02-23 NOTE — Progress Notes (Signed)
CARE MANAGEMENT NOTE 02/23/2014  Patient:  Eddie [name], Eddie [name]   Account Number:  [id number]  Date Initiated:  02/23/2014  Documentation initiated by:  [facility]  Subjective/Objective Assessment:   Falls with multiple right rib fracture/s/p right chest tube placement 02/18/14     Action/Plan:   lives at home with wife, Eddie [name]   Anticipated DC Date:  02/24/2014   Anticipated DC Plan:  SKILLED NURSING FACILITY  In-house referral  Clinical Social Worker      DC Planning Services  CM consult      Choice offered to / List presented to:             Status of service:  Completed, signed off Medicare Important Message given?  YES (If response is "NO", the following Medicare IM given date fields will be blank) Date Medicare IM given:  02/23/2014 Medicare IM given by:  [facility] Date Additional Medicare IM given:   Additional Medicare IM given by:    Discharge Disposition:  [place]  Per UR Regulation:    If discussed at Long Length of Stay Meetings, dates discussed:    Comments:  02/23/2014 1440 NCM spoke to pt's wife, Eddie [name]. States they have a LTC policy that was arranged with their financial advisor, Eddie [name]. Policy with [place], [phone], # [phone]. Attempted call to [facility] to assist pt's wife getting claim started. On hold for 15 mins. Explained to wife she would have to call and wait to speak with rep. Per Eddie [name] at Eddie [name], pt has an 50 day elimination period before his benefits start. Pt will need 24 hour supervision if dc home. Wife states she would consider SNF-rehab. Explained CSW will give bed offers. Explained to wife that if [facility] is arranged she will have to arrange a caregiver for 24 hours/7 days a week. States she could afford private duty aide. Eddie Finner RN CCM Case Mgmt phone [phone]

## 2014-02-23 NOTE — Progress Notes (Addendum)
Subjective: Eddie [name] feels "fine", with some pain on his right side since the chest tube was removed.   His wife has remaining questions about options for home help vs. SNF. She has not gotten to meet with CM yet on this admission.  Events: patient had a run of tachycardia in the 140s, found to be in atrial fibrillation overnight. He was asymptomatic. Given 2.5 mg metoprolol and now at HR77.  Objective: Vital signs in last 24 hours: Filed Vitals:   02/23/14 0224 02/23/14 0228 02/23/14 0534 02/23/14 0643  BP: 89/46 105/65 116/53   Pulse: 88  77   Temp: 99.3 F (37.4 C)  98.4 F (36.9 C)   TempSrc: Oral  Oral   Resp:   17   Height:      Weight:    153 lb 7 oz (69.6 kg)  SpO2: 100%  98%    Weight change: -3 lb 7.4 oz (-1.569 kg)  Intake/Output Summary (Last 24 hours) at 02/23/14 1001 Last data filed at 02/23/14 0646  Gross per 24 hour  Intake    330 ml  Output    675 ml  Net   -345 ml   Chest Tube 100 ml out yesterday.  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) Neuro: A&Ox2 (knows name, hospital, wife's name, cannot identify who drew him pictures that are hanging in his room, not oriented to time), CN 2-12 grossly intact, moving all 4 extremities  Lab Results: Basic Metabolic Panel:  Recent Labs Lab 02/19/14 0347 02/23/14 0221  NA 140 138  K 4.3 4.1  CL 107 101  CO2 22 26  GLUCOSE 137* 121*  BUN 19 16  CREATININE 0.93 0.93  CALCIUM 8.0* 8.5  MG 2.1 2.0    Liver Function Tests:  Recent Labs Lab 02/18/14 0723  AST 30  ALT 25  ALKPHOS 63  BILITOT 0.9  PROT 6.4  ALBUMIN 3.7  CBC:  Recent Labs Lab 02/19/14 0347 02/20/14 0336  02/22/14 0526 02/23/14 0628  WBC 9.7 8.5  < > 7.6 7.9  NEUTROABS 7.2 6.1  --   --   --   HGB 9.4* 9.5*  < > 8.9* 8.8*  HCT 28.3* 28.6*  < > 26.4* 26.9*  MCV 92.5 93.2  < > 91.7 90.3  PLT 117* 107*  < > 154 189  < > = values in this interval not displayed. Cardiac Enzymes:  Recent Labs Lab 02/18/14 1416 02/23/14 0221 02/23/14 0628  TROPONINI <0.30 <0.30 <0.30   CBG:  Recent Labs Lab 02/21/14 1632 02/21/14 2150 02/22/14 0605 02/22/14 1615 02/22/14 2116 02/23/14 0648  GLUCAP 133* 123* 126* 124* 127* 105*   Hemoglobin A1C:  Recent Labs Lab 02/18/14 1416  HGBA1C 5.8*   Thyroid Function Tests:  Recent Labs Lab 02/18/14 1416  TSH 0.924   Urinalysis:  Recent Labs Lab 02/18/14 1012  COLORURINE YELLOW  LABSPEC 1.042*  PHURINE 5.0  GLUCOSEU NEGATIVE  HGBUR NEGATIVE  BILIRUBINUR NEGATIVE  KETONESUR 15*  PROTEINUR NEGATIVE  UROBILINOGEN 0.2  NITRITE  NEGATIVE  LEUKOCYTESUR NEGATIVE    Micro Results: No results found for this or any previous visit (from the past 240 hour(s)). Studies/Results: Dg Chest Port 1 View  02/23/2014   CLINICAL DATA:  Chest tubes, right hemothorax  EXAM: PORTABLE CHEST - 1 VIEW  COMPARISON:  02/22/2014  FINDINGS: Cardiomediastinal silhouette is stable. Trace left pleural effusion with left basilar atelectasis. Stable right chest tube position. Right lower displaced rib fractures again noted. Stable tiny right apical pneumothorax. Trace left pleural effusion with basilar atelectasis.  IMPRESSION: Trace bilateral pleural effusion with bilateral basilar atelectasis. Stable tiny right apical pneumothorax. Stable right chest tube position.   Electronically Signed   By: Lahoma Crocker M.D.   On: 02/23/2014 08:05   Dg Chest Port 1 View  02/22/2014   CLINICAL  DATA:  Pneumothorax, hemothorax, trauma  EXAM: PORTABLE CHEST - 1 VIEW  COMPARISON:  02/21/2014  FINDINGS: Right-sided rib fractures are reidentified. Aeration is decreased since previously. Trace residual right apical pneumothorax is smaller. Right-sided chest tube remains in place. Heart size upper limits of normal. Left lung is clear. Trace bilateral pleural fluid is present.  IMPRESSION: Decreased, now trace, right apical pneumothorax.  Trace bilateral pleural effusions.   Electronically Signed   By: Conchita Paris M.D.   On: 02/22/2014 09:24   Medications: I have reviewed the patient's current medications. Scheduled Meds: . atorvastatin  80 mg Oral q1800  . B-complex with vitamin C  1 tablet Oral Daily  . docusate sodium  100 mg Oral BID  . donepezil  10 mg Oral Daily  . ezetimibe  10 mg Oral Daily  . fluticasone  2 spray Each Nare Daily  . gabapentin  400 mg Oral Daily  . HYDROcodone-acetaminophen  1 tablet Oral Q4H  . Memantine HCl ER  21 mg Oral Q1400  . sodium chloride  3 mL Intravenous Q12H  . sotalol  40 mg Oral BID   Continuous Infusions:   ECHO 06/24/2013: EF 60% with mild concentric LV hypertrophy, moderate fibrocalcific changes of the MV. Trace MR. Severely sclerotic and moderately stenotic appearing trileaflet aortic valve. The left ventricular outflow tract velocity is 0.8 m/s and the aortic velocity is 3.7 m/s. Leads to a peak gradient of 56 mmHg and a mean of 34. Valve area calculated to be 0.7 cm2. Mild aortic insufficiency.  Impression: Based on doppler data, moderate aortic stenosis, however, based upon the continuity equation, the patient has severe to perhaps critical aortic stenosis. Visual inspection is less impressive. However, patient does have mild LV hypertrophy with preserved diastolic dysfunction. MV has degenerative changes but functions normally. The MV regurg as described is not significant.   PRN Meds:.metoprolol, polyethylene  glycol Assessment/Plan: Principal Problem:   Hemothorax with pneumothorax, traumatic Active Problems:   Atrial fibrillation   Dementia   Hemothorax, right   Rib fractures   Abnormality of gait   Hypotension   Thrombocytopenia   Thyroid mass   Dyslipidemia  80 year old man on home Pradaxa with traumatic fall with hemothorax, pneumothorax and rib fractures on the right who is now status post chest tube placement and Pradaxa-reversal with Praxbind.  Large, Right-Sided Traumatic Hemothorax: There was initially a small pneumothorax on the right that has resolved on CXR. Chest tube was placed and put on waterseal; removed today. Hemoglobin trending down 13.0-->10.4-->9.4-->9.5-->8.5-->8.9-->8.8. Has had better pain control with scheduled norco in place.  - Appreciate trauma recs - Incentive spirometry - O2 PRN - Norco/Vicodin 1-2 tablets q4 now  scheduled for pain; effectively controlling his pain  Fragility Fractures, Multiple: Right ribs. - Incentive spirometry and pain control as above - Encourage mobilization - Appreciate PT visit and recommendations - Outpatient dexa scan  Recurrent Falls: Unclear from history whether fall was mechanical in the context of dementia or whether patient may have "blacked out" prior to the fall after rising from a chair. However, patient records suggest that he has moderate symptomatic aortic stenosis (per echo results 06/2013 in [place]). PE is not likely, as patient was on pradaxa. Had a massive CVA in past, but CT now negative with no focal abnormalities. He could have had an acute MI, but the patient's EKG and troponins were negative on admission. His labs are not showing electrolyte abnormalities. It is very possible that this was a mechanical fall or due to orthostatic hypotension. Most likely AS or orthostasis contributing. - TTE has been completed; read pending - Orthostatics pending  - PT and OT   Thrombocytopenia: Resolved.  130-->120-->118-->117-->107-->116-->154-->189. Appears to have resolved. Platelets were 140 per PCP records in 11/2013. I cannot find thrombocytopenia as a side effect of Praxbind. He is on no other medications that are known to cause thrombocytopenia; simvastatin can cause thrombocytopenia, and he is on atorvastatin. Haloperidol can cause it, and it was mentioned in early notes that this could be given if necessary to the patient; however, it was never given. May have been dilutional.  - Continue to trend  Paroxysmal Atrial Fibrillation: Bradycardic in NSR currently. Asymptomatic. Was given Pradaxa-reversing agent in ED (Praxbind). CHADS 2 score is 2; CHADS VASc is 4. Had episode of atrial fibrillation overnight. - Continue to hold Pradaxa and Aspirin 81 - Consider restarting anticoagulation once stable; will restart 2 weeks from discharge as per discussion with Dr. Grandville Silos (trauma surgeon who placed the CT) - Monitor via tele - Continue Sotalol 40 mg bid   Hypotension: Currently 104/43. Has been hypo to normotensive. - Monitor BP closely  Incidental Thyroid Mass: Smaller than 3 cm diameter (2.3 x 1.9 cm right thyroid mass), asymptomatic, TSH WNL. - No intervention needed at this time - Recommend f/u in 6 months  Dementia:  - Memantine ER 28 mg (corrected dose), Aricept 10 mg continued - Per EMS tendency to be aggressive though wife declines this; patient had sitter for first several days, now d/c  Dyslipidemia/CAD/Carotid Artery Stenosis: Wife states that patient had CEA on the left and doctors were "watching his right" carotid artery. Can obtain records from Dr. Luvenia Heller if necessary. - Continued Lipitor 80 mg, Zetia - Hold aspirin for now  Diet:  - H/H   DVT Ppx:  - Hold anticoagulation - SCDs only   Dispo: Disposition is deferred at this time, awaiting improvement of current medical problems.  Anticipated discharge in approximately 0-1 day(s). Patient lives alone with his wife,  who cannot manage his medical problems at this time. Looking into 24 hour assist (wife's preference) with PT / RN / and aide vs SNF. Given information on [facility]. - Request that CM visit with the patient and his wife regarding home help.  The patient does not have a current PCP (No Pcp Per Patient) and does not need an [facility] hospital follow-up appointment after discharge. Patient would like to establish care (new to the area) with Dr. Minus Liberty.  The patient does have transportation limitations that hinder transportation to clinic appointments.  .Services Needed at time of discharge: Y = Yes, Blank = No PT:   OT:   RN:  Equipment:   Other:     LOS: 5 days   Drucilla Schmidt, MD 02/23/2014, 10:01 AM

## 2014-02-23 NOTE — Clinical Social Work Note (Addendum)
Met with patient and his wife to discuss bed offers for SNF.  Patient's wife said she does not want him to go to SNF because she is afraid he might not come back.  Patient's wife was reassured that people usually go to SNF for short term rehab and then return back home.  Patient's wife asked about hiring someone privately from an agency to help patient.  Informed patient's wife that she would have to pay out of pocket and it could be expensive, but she would have to call agencies on her own and ask for prices and what kind of services they can provide.  Patient asked about getting home health informed her that if she has home health, the therapist would only be coming in maybe two or three times a week.  Patient's wife still feels like she would prefer home health, even though it was expressed to her it may be difficult to care for her husband.  Patient's wife was given a list of private care agencies and also the list of SNFs that have given bed offers.  Informed patient's wife that he may be ready for discharge tomorrow depending on what the physician says.  Patient's wife asked to have the nurse page the doctor so she could talk to them.  Informed patient's nurse that the wife wanted to speak with a physician.  Informed patient's wife that CSW will check with her tomorrow to see if she made a decision for a SNF or if she was going to do home health.  Asked patient's wife if she would like CSW to contact a niece or nephew to talk about facilities, and patient's wife said no, they don't know about the facilities in the area, informed her that someone can look on medicare.gov website to read reviews.  Jones Broom. Dickinson, MSW, [place] 02/23/2014 4:57 PM

## 2014-02-23 NOTE — Progress Notes (Signed)
  Date: 02/23/2014  Patient name: Eddie [name]  Medical record number: [id number]  Date of birth: 07-Sep-1932   This patient has been seen and the plan of care was discussed with the house staff. Please see their note for complete details. I concur with their findings with the following additions/corrections: Mr Petzold has had his chest tube removed. Wife in room. Opioid straightened out. Decision now is home vs SNF. Asked family to discuss and decide today.  Bartholomew Crews, MD 02/23/2014, 3:23 PM

## 2014-02-23 NOTE — Progress Notes (Addendum)
Patient gong in and out of At. Fib R.V.R. 120-150 and then back down to 50-80 S.B,. S.R. Patient voiced no complaints skin warm and dry see v.s. Flow sheet. R.N.l aware and into assess patient. EkG done to confirm At Fib. Patient also having short runs of V-tach 3- 6 beats. No resp. Issue noted. M.D. On call paged and made aware and she will call me back after reviewing patient chart.

## 2014-02-23 NOTE — Progress Notes (Addendum)
CCMD notified RN that patient converted back to SR with PVC's. Will continue to monitor.

## 2014-02-23 NOTE — Progress Notes (Signed)
Physical Therapy Treatment Patient Details Name: Eddie [name] MRN: [id number] DOB: 12-02-1932 Today's Date: 02/23/2014    History of Present Illness patient is a 80 yo male s/p mechanical fall x2 over the past week with resultant multiple rib fractures and hemothorax, patient with chest tube placed.    PT Comments    Patient progressing towards physical therapy goals. Ambulates generally well with the use of a left platform walker for stability. Difficulty with bed mobility and transfers requiring min-mod assist due to pain. Wife reports she is unable to physically assist patient. They very much want to return home but we still recommend short term SNF unless they are able to arrange 24 hour care from an aide at home for a period of time until patient is more independent with transfers.  Follow Up Recommendations  SNF;Supervision/Assistance - 24 hour     Equipment Recommendations  Other (comment) (left platform RW)    Recommendations for Other Services       Precautions / Restrictions Precautions Precautions: Fall Restrictions Weight Bearing Restrictions: No    Mobility  Bed Mobility Overal bed mobility: Needs Assistance Bed Mobility: Rolling;Sidelying to Sit Rolling: Min assist Sidelying to sit: Mod assist;HOB elevated       General bed mobility comments: VCs for sequencing and positioning, moderate assist to elevate to upright for trunk support, increased pain with mobility. Bed pad to assist with scoot forward.  Transfers Overall transfer level: Needs assistance Equipment used: Left platform walker Transfers: Sit to/from Stand Sit to Stand: Min assist         General transfer comment: Min assist for balance to stand. Very slow to rise. VC for hand placement.  Good stability once holding rolling walker. Performed from lowest bed setting. Max cues for hand placement when attempting to sit.  Ambulation/Gait Ambulation/Gait assistance: Min guard Ambulation Distance  (Feet): 200 Feet Assistive device: Left platform walker Gait Pattern/deviations: Step-through pattern;Decreased stride length;Trunk flexed;Narrow base of support Gait velocity: decreased   General Gait Details: Improved balance with use of a left platform walker for support. VC for upright posture and forward gaze. educated on safe use of DME with this device.   Stairs            Wheelchair Mobility    Modified Rankin (Stroke Patients Only)       Balance                                    Cognition Arousal/Alertness: Awake/alert Behavior During Therapy: WFL for tasks assessed/performed Overall Cognitive Status: History of cognitive impairments - at baseline                      Exercises General Exercises - Lower Extremity Ankle Circles/Pumps: AROM;Both;10 reps;Seated    General Comments General comments (skin integrity, edema, etc.): Time spent discussing home environment and safe mobility. Wife reports she is physically unable to help him out of the bed. Discussed d/c planning. They very much want to return home so they can continue to support one another. SpO2 95% and greater on room air during therapy session.      Pertinent Vitals/Pain Pain Assessment: 0-10 Pain Score:  ("Hurts when I move" no numerical value given) Pain Location: Rt flank Pain Descriptors / Indicators: Sharp Pain Intervention(s): Limited activity within patient's tolerance;Monitored during session;Repositioned    Home Living  Prior Function            PT Goals (current goals can now be found in the care plan section) Acute Rehab PT Goals Patient Stated Goal: to get moving PT Goal Formulation: With patient Time For Goal Achievement: 03/05/14 Potential to Achieve Goals: Fair Progress towards PT goals: Progressing toward goals    Frequency  Min 3X/week    PT Plan Frequency needs to be updated    Co-evaluation              End of Session Equipment Utilized During Treatment: Gait belt (chest tube) Activity Tolerance: Patient tolerated treatment well Patient left: in chair;with call bell/phone within reach;with family/visitor present     Time: 0912-1007 (-13 minutes non-therapeutic PA spoke with patient) PT Time Calculation (min) (ACUTE ONLY): 55 min  Charges:  $Gait Training: 8-22 mins $Therapeutic Activity: 8-22 mins $Self Care/Home Management: 8-22                    G Codes:      Ellouise Newer 02/25/2014, 11:11 AM  Elayne Snare, [place]

## 2014-02-23 NOTE — Plan of Care (Signed)
Problem: Acute Rehab PT Goals(only PT should resolve) Goal: Patient Will Transfer Sit To/From Stand Outcome: Completed/Met Date Met:  02/23/14

## 2014-02-23 NOTE — Progress Notes (Signed)
Patient HR up to 190's SVT vs AFib per CCMD, also with multiple short non-sustained VT runs up to 10 beats. RN prepared to give metoprolol 2.5mg  IV. Upon entering room pulse in 50's per SpO2 monitor, called CCMD to confirm patient rate was 48-52, then back up to 110's. Patient BP stable, awake and confused at baseline. Did not administer IV metoprolol and HR returned back to SR/ST 80-120's without intervention. Will continue to monitor.

## 2014-02-23 NOTE — Progress Notes (Signed)
Chest tube discontinued per MD orders and protocol. Patient tolerated procedure well and was encouraged to call RN with any new questions and/or concerns. Call bell within reach. RN will continue to monitor. Alfredo Bach RN BSN 02/23/2014 11:26 AM

## 2014-02-23 NOTE — Progress Notes (Signed)
Patient went into rapid a-fib with HR > 200. Patient is asymptomatic. Blood pressure 153/70. RN to administer IV lopressor 2.5 mg per orders

## 2014-02-23 NOTE — Progress Notes (Signed)
[facility] Trauma Service  Progress Note   LOS: 5 days   Subjective: Pt says pain is much improved.  He says his breathing is better as well.  Wife says his oxygen is back on.  Getting up with therapy.  CXR shows pleural effusion and tiny pneumo.   Wife at bedside.    Objective: Vital signs in last 24 hours: Temp:  [98.3 F (36.8 C)-100.2 F (37.9 C)] 98.4 F (36.9 C) (12/07 0534) Pulse Rate:  [72-129] 77 (12/07 0534) Resp:  [16-18] 17 (12/07 0534) BP: (89-116)/(46-65) 116/53 mmHg (12/07 0534) SpO2:  [96 %-100 %] 98 % (12/07 0534) Weight:  [153 lb 7 oz (69.6 kg)] 153 lb 7 oz (69.6 kg) (12/07 0643) Last BM Date:  (unk)  Lab Results:  CBC  Recent Labs  02/22/14 0526 02/23/14 0628  WBC 7.6 7.9  HGB 8.9* 8.8*  HCT 26.4* 26.9*  PLT 154 189   BMET  Recent Labs  02/23/14 0221  NA 138  K 4.1  CL 101  CO2 26  GLUCOSE 121*  BUN 16  CREATININE 0.93  CALCIUM 8.5    Imaging: Dg Chest Port 1 View  02/23/2014   CLINICAL DATA:  Chest tubes, right hemothorax  EXAM: PORTABLE CHEST - 1 VIEW  COMPARISON:  02/22/2014  FINDINGS: Cardiomediastinal silhouette is stable. Trace left pleural effusion with left basilar atelectasis. Stable right chest tube position. Right lower displaced rib fractures again noted. Stable tiny right apical pneumothorax. Trace left pleural effusion with basilar atelectasis.  IMPRESSION: Trace bilateral pleural effusion with bilateral basilar atelectasis. Stable tiny right apical pneumothorax. Stable right chest tube position.   Electronically Signed   By: Lahoma Crocker M.D.   On: 02/23/2014 08:05   Dg Chest Port 1 View  02/22/2014   CLINICAL DATA:  Pneumothorax, hemothorax, trauma  EXAM: PORTABLE CHEST - 1 VIEW  COMPARISON:  02/21/2014  FINDINGS: Right-sided rib fractures are reidentified. Aeration is decreased since previously. Trace residual right apical pneumothorax is smaller. Right-sided chest tube remains in place. Heart size upper limits of  normal. Left lung is clear. Trace bilateral pleural fluid is present.  IMPRESSION: Decreased, now trace, right apical pneumothorax.  Trace bilateral pleural effusions.   Electronically Signed   By: Conchita Paris M.D.   On: 02/22/2014 09:24     PE: General: pleasant, WD/WN white male who is laying in bed in NAD Heart: currently normocardic. Obvious murmur - AS.  Palpable radial and pedal pulses bilaterally Lungs: CTAB, no wheezes, rhonchi, or rales noted.  Diminished at right base.  Respiratory effort nonlabored, good effort.  Chest wall tenderness on right Abd: soft, NT/ND, +BS, no masses, hernias, or organomegaly MS: all 4 extremities are symmetrical with no cyanosis, clubbing, or edema. Psych: A&Ox3 with an appropriate affect.   CT: @300mL  level since new pleuravax 130mL output per 24hr No air leak   Assessment/Plan: Fall ?syncope Multiple right rib fracture - pulm toilet Hemopneumothorax - s/p right chest tube placement 02/18/14 - today output is 119mL, but his wife says every time  He gets up it puts out more.  Will check with Dr. Grandville Silos, to see if its ready to come out since its on the borderline. AODM Afib - chronic anitcoagulation on Pradaxa and ASA, but on hold Dementia CAD/Carotid stenosis Hypertension  VTE - SCD's FEN - carb mod diet Disp - d/c plan per primary service - SNF vs 24hr supervision   Coralie Keens, [place] Pager: [phone] General Trauma PA  Pager: [phone]   02/23/2014  ADDENDUM: HAVE NURSES REMOVE RIGHT CHEST TUBE TODAY.

## 2014-02-23 NOTE — Clinical Social Work Psychosocial (Addendum)
Clinical Social Work Department BRIEF PSYCHOSOCIAL ASSESSMENT 02/20/2014  Patient:  Eddie [name], Eddie [name]     Account Number:  [id number]     Admit date:  02/18/2014  Clinical Social Worker:  Dian Queen  Date/Time:  02/20/2014 10:44 AM  Referred by:  Physician  Date Referred:  02/20/2014 Referred for  SNF Placement   Other Referral:   Interview type:  Patient Other interview type:   family    PSYCHOSOCIAL DATA Living Status:  WIFE Admitted from facility:   Level of care:   Primary support name:  Asher Torpey Primary support relationship to patient:  SPOUSE Degree of support available:   Wife has some dementia, and patient is primary caregiver. Patient and wife stated that if he had to go somewhere then someone would have to take care of her or find a place to go.    CURRENT CONCERNS Current Concerns  Post-Acute Placement   Other Concerns:    SOCIAL WORK ASSESSMENT / PLAN Patient is a 80 year old male who's wife is the primary caregiver for him because of his dementia.  Patient's wife and nephew were at the bedside.  Patient's wife stated she helps take care of her husband helps take care of him. Patient, did respond to some questions, but most of the time he was just listening to the conversation.  Patient and wife prefer home health instead of placement for SNF because  wife is primary caregiver.  Patient and wife did agree to placement to a SNF if there are no other options.  Explained to patient's wife, nephew, and patient about PACE program as an option to help with some personal care needs and also discussed how insurance pays for SNF placement.   Assessment/plan status:   Other assessment/ plan:   Information/referral to community resources:    PATIENT'S/FAMILY'S RESPONSE TO PLAN OF CARE: Patient and family hesitant about going to SNF but will if home health is not an option.    Jones Broom. [place], MSW, [place] 02/23/2014 10:55 AM

## 2014-02-24 ENCOUNTER — Inpatient Hospital Stay (HOSPITAL_COMMUNITY): Payer: Medicare Other

## 2014-02-24 LAB — GLUCOSE, CAPILLARY
Glucose-Capillary: 123 mg/dL — ABNORMAL HIGH (ref 70–99)
Glucose-Capillary: 146 mg/dL — ABNORMAL HIGH (ref 70–99)
Glucose-Capillary: 140 mg/dL — ABNORMAL HIGH (ref 70–99)

## 2014-02-24 MED ORDER — HYDROCODONE-ACETAMINOPHEN 5-325 MG PO TABS: 1.0000 | ORAL_TABLET | Freq: Four times a day (QID) | ORAL | Status: DC | PRN

## 2014-02-24 MED ORDER — DABIGATRAN ETEXILATE MESYLATE 150 MG PO CAPS: 150.0000 mg | ORAL_CAPSULE | Freq: Two times a day (BID) | ORAL | Status: DC

## 2014-02-24 MED FILL — Idarucizumab IV Soln 2.5 GM/50ML: INTRAVENOUS | Qty: 100 | Status: AC

## 2014-02-24 NOTE — Clinical Social Work Note (Signed)
Spoke to patient's wife to discuss SNF vs Home Health.  Patient's wife would like [facility], and she says she has someone who will be self-pay for 24 hour care.  Patient's wife was informed that the doctor is thinking about possibly discharging either today or tomorrow.  Informed her the nurse and physician will keep her updated.  Contacted case manager and informed her that patient is choosing to have home health, left a message on voice mail.  Jones Broom. [place], MSW, [place] 02/24/2014 12:51 PM

## 2014-02-24 NOTE — Plan of Care (Signed)
Problem: Discharge Progression Outcomes Goal: Discharge plan in place and appropriate Outcome: Completed/Met Date Met:  02/24/14 Goal: Pain controlled with appropriate interventions Outcome: Completed/Met Date Met:  02/24/14 Goal: Tolerating diet Outcome: Completed/Met Date Met:  02/24/14

## 2014-02-24 NOTE — Clinical Social Work Note (Signed)
CSW received referral for SNF patient and wife chose not to go to SNF.  Case discussed with case manager and family, plan is to discharge home with home health.  CSW to sign off please re-consult if social work needs arise.  Eddie [name]. [place], MSW, [place]

## 2014-02-24 NOTE — Discharge Instructions (Signed)
Keep dressing in place for 4 days total (can remove it on 12/12)  Keep wound covered while showering for 1 week (can shower uncovered starting 12/15).   Sutures will need to be removed in 1 week during your follow up appointment in the Internal Medicine Clinic (12/15).  You will restart your Pradaxa in 2 weeks (12/22).  Here is some general information about chest tubes: Chest Tube A chest tube is a small, flexible drainage tube that is put into the chest. The tube drains fluid, blood, or extra air that has built up between the lungs and the inside of the chest wall (pleural space). Fluid or air can build up in this area for various reasons. When this occurs, it can prevent the lung from expanding completely and cause breathing problems. This can be dangerous. The chest tube allows the lung to re-expand. The procedure to put in the chest tube involves inserting the tube through the skin between the ribs and into the pleural space. LET [facility] CARE PROVIDER KNOW ABOUT:   Any allergies you have.  All medicines you are taking, including vitamins, herbs, eye drops, creams, and over-the-counter medicines.  Previous problems you or members of your family have had with the use of anesthetics.  Any blood disorders you have.  Previous surgeries you have had.  Medical conditions you have. RISKS AND COMPLICATIONS Generally, this is a safe procedure. However, as with any procedure, problems can occur. Possible problems include:  Bleeding.  Injury to the lung.  Infection.  Chest tube failing to work properly, usually due to leaking of air around the tube, or tube positioning in a place where all of the fluid or air cannot be drained.  Problems related to the use of anesthetics or pain medicines. BEFORE THE PROCEDURE  Ask your health care provider if there are any special preparatory instructions such as not eating before the procedure. Follow these instructions exactly. PROCEDURE  The  area where the chest tube will be inserted is numbed with a medicine (local anesthetic). You may be given medicine for pain and medicine to help you relax (sedative). An incision is made between the ribs, and a small opening is made through the inner lining of the chest wall. The chest tube is inserted through this opening and into the pleural space. The other end of the chest tube may be connected to a plastic container that collects the fluid drained from the pleural space and has sterile water to make a one-way seal, or "water seal," that prevents air from going back into the pleural space. Suction is sometimes attached to the system for drainage. A stitch (suture) or tape is used to keep the tube in place.  AFTER THE PROCEDURE  A chest X-ray will be done to check the position of the chest tube. You will be monitored for breathing difficulties, air leaks in the chest tube, and the need for additional oxygen. You will be encouraged to breathe deeply. You may be given antibiotic medicine to prevent or treat infection. The chest tube will stay in place until all the extra air or fluid has drained from the chest. You will likely need to stay in the hospital until the chest tube is removed. In rare cases, you may go home with the chest tube in place. Document Released: 06/14/2006 Document Revised: 03/11/2013 Document Reviewed: 09/11/2012 [facility] Patient Information 2015 [place], [place]. This information is not intended to replace advice given to you by your health care provider. Make  sure you discuss any questions you have with your health care provider.

## 2014-02-24 NOTE — Progress Notes (Signed)
Occupational Therapy Treatment Patient Details Name: [place] Perra MRN: [id number] DOB: 1933-01-02 Today's Date: 02/24/2014    History of present illness patient is a 80 yo male s/p mechanical fall x2 over the past week with resultant multiple rib fractures and hemothorax, patient with chest tube placed.   OT comments  Pt requires min- mod A for basic self care activities.  He requires mod verbal and tactile cues to follow commands and for problem solving due to dementia/confusion.   He will require 24 hour close supervision/assistance.  Wife present during session as was a potential caregiver she was interviewing.  Spoke at length with spouse, and caregiver, about pt care requirements and the need for 24/7 assistance.  Reiterated to pt that she and pt are not safe to be alone together for even brief periods.  She insists she will have 24 hour help, but is unable to fully state what that plan is.  Recommend w/c for home use.   Follow Up Recommendations  SNF;Supervision/Assistance - 24 hour    Equipment Recommendations   w/c (tub equipment TBD by HHOT if he d/c's home)    Recommendations for Other Services      Precautions / Restrictions Precautions Precautions: Fall       Mobility Bed Mobility Overal bed mobility: Needs Assistance Bed Mobility: Supine to Sit;Sit to Supine     Supine to sit: Min assist Sit to supine: Min assist   General bed mobility comments: assist to lift shoulders and assist to place feet back into bed   Transfers Overall transfer level: Needs assistance Equipment used: 1 person hand held assist (Pt pushed walker away) Transfers: Sit to/from Stand;Stand Pivot Transfers Sit to Stand: Min assist Stand pivot transfers: Min assist       General transfer comment: min A for balance.     Balance Overall balance assessment: Needs assistance Sitting-balance support: Feet supported Sitting balance-Leahy Scale: Good     Standing balance support: During  functional activity Standing balance-Leahy Scale: Poor Standing balance comment: Pt requires min A to maintain balance.                    ADL Overall ADL's : Needs assistance/impaired     Grooming: Wash/dry hands;Oral care;Moderate assistance;Standing Grooming Details (indicate cue type and reason): requires assist to complete task                 Toilet Transfer: Minimal assistance;RW;Ambulation (HHA) Toilet Transfer Details (indicate cue type and reason): Pt confused and with difficulty negotiating and problem solving through novel environment  Toileting- Clothing Manipulation and Hygiene: Minimal assistance;Sit to/from stand       Functional mobility during ADLs: Minimal assistance General ADL Comments: Pt confused.  Requires frequent redirection with activity, and often with difficulty following commands consistently.  Wife present with potential caregiver in room  Long discussion with wife re: pt's current status, the need for 24/7 close supervision - reinforced to pt that she and pt are not safe to be left alone for even brief periods of time at this point. She states she understands this.  Also explained that it is difficult to predict his progress and progression at present.  He may eventually return to his baseline, and he may not and may now require an increased amount of assistance.        Vision                     Perception  Praxis      Cognition   Behavior During Therapy: WFL for tasks assessed/performed Overall Cognitive Status: Difficult to assess                       Extremity/Trunk Assessment               Exercises     Shoulder Instructions       General Comments      Pertinent Vitals/ Pain       Pain Assessment: Faces Faces Pain Scale: Hurts a little bit Pain Location: right flank Pain Descriptors / Indicators: Grimacing Pain Intervention(s): Monitored during session;Repositioned  Home Living                                           Prior Functioning/Environment              Frequency Min 1X/week     Progress Toward Goals  OT Goals(current goals can now be found in the care plan section)  Progress towards OT goals: Progressing toward goals  Acute Rehab OT Goals Time For Goal Achievement: 03/05/14 ADL Goals Pt Will Perform Lower Body Bathing: with min assist;sit to/from stand Pt Will Perform Lower Body Dressing: with min assist;sit to/from stand Pt Will Transfer to Toilet: with min assist;ambulating Pt Will Perform Toileting - Clothing Manipulation and hygiene: with min assist;sit to/from stand  Plan Discharge plan remains appropriate    Co-evaluation                 End of Session     Activity Tolerance Patient tolerated treatment well   Patient Left in bed;with call bell/phone within reach;with bed alarm set;with family/visitor present   Nurse Communication Mobility status        Time: 1305-1401 OT Time Calculation (min): 56 min  Charges: OT General Charges $OT Visit: 1 Procedure OT Treatments $Self Care/Home Management : 53-67 mins  Anmol Paschen M 02/24/2014, 2:23 PM

## 2014-02-24 NOTE — Progress Notes (Signed)
Subjective: Mr. Godwin feels "good, better than yesterday", with some pain on his right side since the chest tube was removed. He is very eager to go home.  His wife is being assessed in [facility] today given her difficulty managing at home without the help of her husband. The two of them have decided to go home rather than to SNF. They will have [facility], OT, RN and aide and will set up a private 24-hour aid on their own as well.  Events: patient had a run of tachycardia in the 140s, found to be in atrial fibrillation overnight. He was asymptomatic. Given 2.5 mg metoprolol and now at HR77.  Objective: Vital signs in last 24 hours: Filed Vitals:   02/23/14 1921 02/23/14 2031 02/24/14 0622 02/24/14 1021  BP: 153/70 139/72 140/51 123/51  Pulse:  102 65 66  Temp:  99.7 F (37.6 C) 98.3 F (36.8 C)   TempSrc:  Oral Oral   Resp:  18 17   Height:      Weight:   149 lb 4 oz (67.7 kg)   SpO2:  98% 98%    Weight change: -4 lb 3 oz (-1.9 kg)  Intake/Output Summary (Last 24 hours) at 02/24/14 1130 Last data filed at 02/24/14 0830  Gross per 24 hour  Intake    440 ml  Output   1850 ml  Net  -1410 ml   Chest Tube removed yesterday.  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) Neuro: A&Ox2 (knows name, hospital, wife's name, cannot identify who drew him pictures that are hanging in his room, not oriented to time), CN 2-12 grossly intact, moving all 4 extremities  Lab Results: Basic Metabolic  Panel:  Recent Labs Lab 02/19/14 0347 02/23/14 0221  NA 140 138  K 4.3 4.1  CL 107 101  CO2 22 26  GLUCOSE 137* 121*  BUN 19 16  CREATININE 0.93 0.93  CALCIUM 8.0* 8.5  MG 2.1 2.0   Liver Function Tests:  Recent Labs Lab 02/18/14 0723  AST 30  ALT 25  ALKPHOS 63  BILITOT 0.9  PROT 6.4  ALBUMIN 3.7  CBC:  Recent Labs Lab 02/19/14 0347 02/20/14 0336  02/22/14 0526 02/23/14 0628  WBC 9.7 8.5  < > 7.6 7.9  NEUTROABS 7.2 6.1  --   --   --   HGB 9.4* 9.5*  < > 8.9* 8.8*  HCT 28.3* 28.6*  < > 26.4* 26.9*  MCV 92.5 93.2  < > 91.7 90.3  PLT 117* 107*  < > 154 189  < > = values in this interval not displayed. Cardiac Enzymes:  Recent Labs Lab 02/23/14 0221 02/23/14 0628 02/23/14 1431  TROPONINI <0.30 <0.30 <0.30   CBG:  Recent Labs Lab 02/23/14 0648 02/23/14 1055 02/23/14 1624 02/23/14 2040 02/24/14 0625 02/24/14 1104  GLUCAP 105* 142* 112* 131* 123* 146*   Hemoglobin A1C:  Recent Labs Lab 02/18/14 1416  HGBA1C 5.8*   Thyroid Function Tests:  Recent Labs Lab 02/18/14 1416  TSH 0.924   Urinalysis:  Recent Labs Lab 02/18/14 1012  COLORURINE YELLOW  LABSPEC 1.042*  PHURINE 5.0  GLUCOSEU NEGATIVE  HGBUR NEGATIVE  BILIRUBINUR NEGATIVE  KETONESUR 15*  PROTEINUR NEGATIVE  UROBILINOGEN 0.2  NITRITE NEGATIVE  LEUKOCYTESUR NEGATIVE    Orthostatics + (Sitting 108/47-->Standing 78/54)  Micro Results: No results found for this or any previous visit (from the past 240 hour(s)). Studies/Results: Dg Chest Port 1 View  02/24/2014   CLINICAL DATA:  80 year old male with rib fractures and pneumothorax. Subsequent encounter.  EXAM: PORTABLE CHEST - 1 VIEW  COMPARISON:  02/23/2014.  FINDINGS: Multiple right-sided rib fractures some of which are displaced.  Small (5% or small) right apical pneumothorax similar to prior exam or minimally smaller.  Opacification right lung base may represent pleural fluid and atelectatic changes. This limits  evaluation for detection of underlying infiltrate  Heart size top-normal.  Minimal blunting left costophrenic angle unchanged.  Calcified aorta.  IMPRESSION: Multiple right-sided rib fractures some of which are displaced.  Small (5% or small) right apical pneumothorax similar to prior exam or minimally smaller.  Opacification right lung base may represent pleural fluid and atelectatic changes. This limits evaluation for detection of underlying infiltrate   Electronically Signed   By: Chauncey Cruel M.D.   On: 02/24/2014 08:07   Dg Chest Port 1 View  02/23/2014   CLINICAL DATA:  Right hemothorax.  EXAM: PORTABLE CHEST - 1 VIEW  COMPARISON:  02/23/2014  FINDINGS: Normal heart size. There has been interval removal of the right chest tube. This measures approximately 8 mm. This is slightly increased in volume from previous exam. Decreased aeration of both lung bases is unchanged from previous exam. Right rib fractures are again noted.  IMPRESSION: 1. Slight increase in volume of right apical pneumothorax after removal of right chest tube. 2. No change in aeration to the lung bases.   Electronically Signed   By: Kerby Moors M.D.   On: 02/23/2014 17:05   Dg Chest Port 1 View  02/23/2014   CLINICAL DATA:  Chest tubes, right hemothorax  EXAM: PORTABLE CHEST - 1 VIEW  COMPARISON:  02/22/2014  FINDINGS: Cardiomediastinal silhouette is stable. Trace left pleural effusion with left basilar atelectasis. Stable right chest tube position. Right lower displaced rib fractures again noted. Stable tiny right apical pneumothorax. Trace left pleural effusion with basilar atelectasis.  IMPRESSION: Trace bilateral pleural effusion with bilateral basilar atelectasis. Stable tiny right apical pneumothorax. Stable right chest tube position.   Electronically Signed   By: Lahoma Crocker M.D.   On: 02/23/2014 08:05   Medications: I have reviewed the patient's current medications. Scheduled Meds: . atorvastatin  80 mg Oral q1800  .  B-complex with vitamin C  1 tablet Oral Daily  . docusate sodium  100 mg Oral BID  . donepezil  10 mg Oral Daily  . ezetimibe  10 mg Oral Daily  . fluticasone  2 spray Each Nare Daily  . gabapentin  400 mg Oral Daily  . HYDROcodone-acetaminophen  1 tablet Oral Q4H  . Memantine HCl ER  28 mg Oral Q1400  . sodium chloride  3 mL Intravenous Q12H  . sotalol  40 mg Oral BID   Continuous Infusions:   ECHO 06/24/2013: EF 60% with mild concentric LV hypertrophy, moderate fibrocalcific changes of the MV. Trace MR. Severely sclerotic and moderately stenotic appearing trileaflet aortic valve. The left ventricular outflow tract velocity is 0.8 m/s and the aortic velocity is 3.7 m/s. Leads to a peak gradient of 56  mmHg and a mean of 34. Valve area calculated to be 0.7 cm2. Mild aortic insufficiency.  Impression: Based on doppler data, moderate aortic stenosis, however, based upon the continuity equation, the patient has severe to perhaps critical aortic stenosis. Visual inspection is less impressive. However, patient does have mild LV hypertrophy with preserved diastolic dysfunction. MV has degenerative changes but functions normally. The MV regurg as described is not significant.   ECHO 02/23/2104: EF 60-65%. Study Conclusions - Left ventricle: The cavity size was normal. Wall thickness was increased in a pattern of mild LVH. Systolic function was normal. The estimated ejection fraction was in the range of 60% to 65%. Doppler parameters are consistent with abnormal left ventricular relaxation (grade 1 diastolic dysfunction). - Aortic valve: AV is thickened, Calcified with restricted motion Peak and mean gradients through the valve are 70 and 44 mm Hg respectively consistent with severe AS. There was mild regurgitation. - Mitral valve: There was mild regurgitation. - Left atrium: The atrium was mildly dilated. - Pulmonary arteries: PA peak pressure: 41 mm Hg (S).  PRN Meds:.metoprolol,  polyethylene glycol Assessment/Plan: Principal Problem:   Hemothorax with pneumothorax, traumatic Active Problems:   Atrial fibrillation   Dementia   Hemothorax, right   Rib fractures   Abnormality of gait   Hypotension   Thrombocytopenia   Thyroid mass   Dyslipidemia  80 year old man on home Pradaxa with traumatic fall with hemothorax, pneumothorax and rib fractures on the right who is now status post chest tube placement and Pradaxa-reversal with Praxbind. Ready for discharge today.  Large, Right-Sided Traumatic Hemothorax: There was initially a small pneumothorax on the right that has resolved on CXR. Chest tube was placed and put on waterseal; removed yesterday. Hemoglobin trending down 13.0-->10.4-->9.4-->9.5-->8.5-->8.9-->8.8. Has had better pain control with scheduled norco in place.  - Appreciate trauma recsfor care - Incentive spirometry - O2 PRN - Norco/Vicodin 1-2 tablets q4 scheduled for pain; effectively controlling his pain  Fragility Fractures, Multiple: Right ribs. - Incentive spirometry and pain control as above - Encourage mobilization - Appreciate PT visit and recommendations - Outpatient dexa scan  Repeated Falls: Found to have severe AS on this admission (with moderate to severe symptomatic AS also visualized in 06/2013). PE is not likely, as patient was on pradaxa. Had a massive CVA in past, but CT now negative with no focal abnormalities. He could have had an acute MI, but the patient's EKG and troponins were negative on admission. Orthostatics + (Sitting 108/47-->Standing 78/54). His labs are not showing electrolyte abnormalities. It is very possible that this was a mechanical fall or due to orthostatic hypotension. Most likely AS or orthostasis contributing. - PT and OT recommending SNF / 24 hour assistance  Severe Atrial Stenosis: Visualized on 02/22/14 echo. Moderate symptomatic aortic stenosis also visualized on echo 06/2013 in [place]. Unclear from history  whether initial fall was mechanical in the context of dementia or whether patient may have "blacked out" prior to the fall after rising from a chair.  - Will need outpatient cardiology f/u  Thrombocytopenia: Resolved. 130-->120-->118-->117-->107-->116-->154-->189. Appears to have resolved. Platelets were 140 per PCP records in 11/2013. I cannot find thrombocytopenia as a side effect of Praxbind. He is on no other medications that are known to cause thrombocytopenia; simvastatin can cause thrombocytopenia, and he is on atorvastatin. Haloperidol can cause it, and it was mentioned in early notes that this could be given if necessary to the patient; however, it was never given. May have been dilutional.  Paroxysmal Atrial Fibrillation: Bradycardic in NSR currently. Asymptomatic. Was given Pradaxa-reversing agent in ED (Praxbind). CHADS 2 score is 2; CHADS VASc is 4. Has had episodes of of atrial fibrillation / 190s SVT overnight with following drop to 50s (?tachy-brady). - Continue to hold Pradaxa and Aspirin 81 - Will restart 2 weeks from discharge as per discussion with Dr. Grandville Silos (trauma surgeon who placed the CT) - Monitor via tele - Continue Sotalol 40 mg bid  - Will need cardiology f/u  Hypotension: Currently 104/43. Has been hypo to normotensive. - Monitor BP closely  Incidental Thyroid Mass: Smaller than 3 cm diameter (2.3 x 1.9 cm right thyroid mass), asymptomatic, TSH WNL. - No intervention needed at this time - Recommend f/u in 6 months  Dementia:  - Memantine ER 28 mg (corrected dose), Aricept 10 mg continued - Per EMS tendency to be aggressive though wife declines this; patient had sitter for first several days, now d/c  Dyslipidemia/CAD/Carotid Artery Stenosis: Wife states that patient had CEA on the left and doctors were "watching his right" carotid artery. Can obtain records from Dr. Luvenia Heller if necessary. - Continued Lipitor 80 mg, Zetia - Hold aspirin for now  Diet:  -  H/H   DVT Ppx:  - Hold anticoagulation - SCDs only   Dispo: Disposition is deferred at this time, awaiting improvement of current medical problems.  Anticipated discharge in approximately 0 day(s). Patient lives alone with his wife, who cannot manage his medical problems at this time. Looking into 24 hour assist with PT / OT / RN / and aide vs SNF. Given information on [facility].  The patient does not have a current PCP (No Pcp Per Patient) and does need an [facility] hospital follow-up appointment after discharge. Appointment scheduled.  The patient does have transportation limitations that hinder transportation to clinic appointments.  .Services Needed at time of discharge: Y = Yes, Blank = No PT:   OT:   RN:   Equipment:   Other:     LOS: 6 days   Drucilla Schmidt, MD 02/24/2014, 11:30 AM

## 2014-02-24 NOTE — Discharge Summary (Signed)
Name: Eddie [name] MRN: [id number] DOB: [birth year] 80 y.o. PCP: No Pcp Per Patient  Date of Admission: 02/18/2014  6:20 AM Date of Discharge: 02/24/2014 Attending Physician: Bartholomew Crews, MD  Discharge Diagnosis: Principal Problem:   Hemothorax with pneumothorax, traumatic Active Problems:   Atrial fibrillation   Dementia   Hemothorax, right   Rib fractures   Abnormality of gait   Hypotension   Thrombocytopenia   Thyroid mass   Dyslipidemia  Discharge Medications:   Medication List    TAKE these medications        acetaminophen 500 MG tablet  Commonly known as:  TYLENOL  Take 500 mg by mouth every 6 (six) hours as needed for mild pain.     aspirin EC 81 MG tablet  Take 81 mg by mouth daily.     atorvastatin 80 MG tablet  Commonly known as:  LIPITOR  Take 80 mg by mouth daily.     b complex vitamins tablet  Take 1 tablet by mouth daily.     dabigatran 150 MG Caps capsule  Commonly known as:  PRADAXA  Take 1 capsule (150 mg total) by mouth 2 (two) times daily.     donepezil 10 MG tablet  Commonly known as:  ARICEPT  Take 10 mg by mouth daily.     ezetimibe 10 MG tablet  Commonly known as:  ZETIA  Take 10 mg by mouth daily.     fluticasone 50 MCG/ACT nasal spray  Commonly known as:  FLONASE  Place 2 sprays into both nostrils daily.     gabapentin 400 MG capsule  Commonly known as:  NEURONTIN  Take 400 mg by mouth daily.     HYDROcodone-acetaminophen 5-325 MG per tablet  Commonly known as:  NORCO/VICODIN  Take 1 tablet by mouth every 6 (six) hours as needed for moderate pain.     magnesium oxide 400 MG tablet  Commonly known as:  MAG-OX  Take 400 mg by mouth daily.     Memantine HCl ER 21 MG Cp24  Commonly known as:  NAMENDA XR  Take 28 mg by mouth daily.     metroNIDAZOLE 1 % gel  Commonly known as:  METROGEL  Apply 1 application topically 2 (two) times daily as needed (rosacea). To face     sotalol 80 MG tablet  Commonly known as:   BETAPACE  Take 40 mg by mouth 2 (two) times daily.        Disposition and follow-up:   Mr.Taryll Tangonan was discharged from [facility] in Stable condition.  At the hospital follow up visit please address:  1.  Traumatic Hemopneumothorax: Mr. Forehand had a fall while on Pradaxa which led to a traumatic right hemopneumothorax. A chest tube was placed 02/18/14 and then removed on 02/23/14 upon resolution. Needs 1 suture removed at hospital follow up 1 week from discharge (03/03/14).   Fragility Fractures: Several right ribs were fractured; needs outpatient dexa scan.  Repeated Falls: Was observed to have AS (per records and on 02/2014 echo), dementia and orthostasis as most likely contributors. PT/OT at home and needs cardiology follow-up once immediate problems under control.  Paroxysmal Atrial Fibrillation: Was given Praxbind (Pradaxa reversing agent) in ED. CHADS 2, CHADS VASc 4 with episodes of afib in the hospital. Holding Pradaxa for 2 weeks after d/c; to be resumed 03/10/14. Continuing home sotalol 40 mg BID.   Incidental Thyroid Mass <3 cm Diameter: F/u in 6 months   2.  Labs / imaging needed at time of follow-up: CBC (hemoglobin 13-->8.5-->8.8 in hospital with no transfusions)  3.  Pending labs/ test needing follow-up: none  Follow-up Appointments:     Follow-up Information    Follow up with Otho Bellows, MD On 03/03/2014.   Specialty:  Internal Medicine   Why:  3:15 PM   Contact information:   [place] [address] [phone]       Follow up with [facility] On 04/15/2014.   Why:  9:00AM   Contact information:   [place] [phone]      Follow up with [facility].   Why:  [place]- nurse/Physical therapy/ Occupational Therapy/aide/ social worker arranged   Contact information:   [address] [place] [address] [phone]        Follow up with [facility].   Why:  rolling walker with left arm platform and w/c arranged- to be delivered to room prior to discharge   Contact information:   [address] [place] [address] [phone]       Discharge Instructions: Keep dressing in place for 4 days total (can remove it on 12/12)  Keep wound covered while showering for 1 week (can shower uncovered starting 12/15).   Sutures will need to be removed in 1 week during your follow up appointment in the Internal Medicine Clinic (12/15).  You will restart your Pradaxa in 2 weeks (12/22).  Here is some general information about chest tubes: Chest Tube A chest tube is a small, flexible drainage tube that is put into the chest. The tube drains fluid, blood, or extra air that has built up between the lungs and the inside of the chest wall (pleural space). Fluid or air can build up in this area for various reasons. When this occurs, it can prevent the lung from expanding completely and cause breathing problems. This can be dangerous. The chest tube allows the lung to re-expand. The procedure to put in the chest tube involves inserting the tube through the skin between the ribs and into the pleural space. LET [facility] CARE PROVIDER KNOW ABOUT:   Any allergies you have.  All medicines you are taking, including vitamins, herbs, eye drops, creams, and over-the-counter medicines.  Previous problems you or members of your family have had with the use of anesthetics.  Any blood disorders you have.  Previous surgeries you have had.  Medical conditions you have. RISKS AND COMPLICATIONS Generally, this is a safe procedure. However, as with any procedure, problems can occur. Possible problems include:  Bleeding.  Injury to the lung.  Infection.  Chest tube failing to work properly, usually due to leaking of air around the tube, or tube positioning in a place where all of the fluid or air cannot  be drained.  Problems related to the use of anesthetics or pain medicines. BEFORE THE PROCEDURE  Ask your health care provider if there are any special preparatory instructions such as not eating before the procedure. Follow these instructions exactly. PROCEDURE  The area where the chest tube will be inserted is numbed with a medicine (local anesthetic). You may be given medicine for pain and medicine to help you relax (sedative). An incision is made between the ribs, and a small opening is made through the inner lining of the chest wall. The chest tube is inserted through this opening and  into the pleural space. The other end of the chest tube may be connected to a plastic container that collects the fluid drained from the pleural space and has sterile water to make a one-way seal, or "water seal," that prevents air from going back into the pleural space. Suction is sometimes attached to the system for drainage. A stitch (suture) or tape is used to keep the tube in place.  AFTER THE PROCEDURE  A chest X-ray will be done to check the position of the chest tube. You will be monitored for breathing difficulties, air leaks in the chest tube, and the need for additional oxygen. You will be encouraged to breathe deeply. You may be given antibiotic medicine to prevent or treat infection. The chest tube will stay in place until all the extra air or fluid has drained from the chest. You will likely need to stay in the hospital until the chest tube is removed. In rare cases, you may go home with the chest tube in place. Document Released: 06/14/2006 Document Revised: 03/11/2013 Document Reviewed: 09/11/2012 [facility] Patient Information 2015 [place], [place]. This information is not intended to replace advice given to you by your health care provider. Make sure you discuss any questions you have with your health care provider.  Consultations:  Trauma surgery  Procedures Performed:  Dg Chest 2 View  02/21/2014    CLINICAL DATA:  Right chest tube  EXAM: CHEST  2 VIEW  COMPARISON:  Yesterday  FINDINGS: Right chest tube is stable with a a small less than 5% right apical pneumothorax. Bibasilar atelectasis right greater than left. No left pneumothorax. Upper normal heart size.  IMPRESSION: Tiny less than 5% right apical pneumothorax. Bibasilar atelectasis right great the left   Electronically Signed   By: Maryclare Bean M.D.   On: 02/21/2014 08:18   Dg Chest 2 View  02/18/2014   CLINICAL DATA:  Fall with right-sided chest and rib pain. Initial encounter.  EXAM: CHEST  2 VIEW  COMPARISON:  None.  FINDINGS: Multiple right-sided rib fractures. Posterior lateral seventh, eighth, ninth, tenth. There may be segmental fractures inferiorly. There is subcutaneous emphysema adjacent rib fractures. Although no gross pneumothorax is seen, this subcutaneous air suggests pleural air.  Mild cardiomegaly with aortic atherosclerosis. Small volume right-sided pleural fluid/hemothorax. Right base airspace disease.  IMPRESSION: Multiple right-sided rib fractures with adjacent subcutaneous air. This suggests otherwise occult right-sided pneumothorax. Consider further evaluation with chest and abdominal CT.  Small volume right pleural fluid/hemothorax. Adjacent airspace disease which is likely atelectasis.   Electronically Signed   By: Abigail Miyamoto M.D.   On: 02/18/2014 07:44   Ct Head Wo Contrast  02/18/2014   CLINICAL DATA:  Memory loss, head injury after multiple falls in last several days.  EXAM: CT HEAD WITHOUT CONTRAST  CT CERVICAL SPINE WITHOUT CONTRAST  TECHNIQUE: Multidetector CT imaging of the head and cervical spine was performed following the standard protocol without intravenous contrast. Multiplanar CT image reconstructions of the cervical spine were also generated.  COMPARISON:  None.  FINDINGS: CT HEAD FINDINGS  Bony calvarium appears intact. Mild diffuse cortical atrophy is noted. Mild chronic ischemic white matter disease is noted.  No mass effect or midline shift is noted. Ventricular size is within normal limits. There is no evidence of mass lesion, hemorrhage or acute infarction.  CT CERVICAL SPINE FINDINGS  No fracture is noted. Minimal grade 1 retrolisthesis is noted at C6-7 secondary to degenerative disc disease at this level. Moderate degenerative disc  disease is also noted at C4-5 and C5-6 with anterior osteophyte formation. Incidental note is made of minimal right apical pneumothorax with associated pleural effusion.  IMPRESSION: Mild diffuse cortical atrophy. Mild chronic ischemic white matter disease. No acute intracranial abnormality seen.  Multilevel degenerative disc disease is noted in the lower cervical spine. No fracture or significant spondylolisthesis is noted.  Incidental note is made of minimal right apical pneumothorax with associated pleural effusion. Please refer to CT scan of chest of same day for further discussion.   Electronically Signed   By: Sabino Dick M.D.   On: 02/18/2014 10:07   Ct Chest W Contrast  02/18/2014   CLINICAL DATA:  Patient's following 2-3 times in the past 2 days. Bruising over the right flank.  EXAM: CT CHEST, ABDOMEN, AND PELVIS WITH CONTRAST  TECHNIQUE: Multidetector CT imaging of the chest, abdomen and pelvis was performed following the standard protocol during bolus administration of intravenous contrast.  CONTRAST:  164mL OMNIPAQUE IOHEXOL 300 MG/ML  SOLN  COMPARISON:  None.  FINDINGS: CT CHEST FINDINGS  The central airways are patent. There is a large right hemothorax. There is a small right pneumothorax measuring less than 10%. There is soft tissue emphysema in the right lateral chest wall. The left lung is clear.  There are no pathologically enlarged axillary, hilar or mediastinal lymph nodes.  The heart size is normal. There is no pericardial effusion. The thoracic aorta is normal in caliber. Extensive multivessel coronary artery atherosclerosis.  There is a 2.3 x 1.9 cm hypodense  right thyroid mass.  Review of bone windows demonstrates no focal lytic or sclerotic lesions. There is a nondisplaced fracture of the right posterior eleventh rib at the costovertebral junction. There is a displaced fracture of the posterolateral tenth rib. There are displaced fractures involving the lateral and posterior seventh, eighth and ninth ribs. There is a displaced fracture of the right anterolateral sixth rib. The vertebral body heights are maintained and are in normal anatomic alignment. There are anterior bridging osteophytes of the thoracic spine as can be seen with diffuse idiopathic skeletal hyperostosis.  CT ABDOMEN AND PELVIS FINDINGS  The liver demonstrates no focal abnormality. There is no intrahepatic or extrahepatic biliary ductal dilatation. The gallbladder is normal. The spleen demonstrates no focal abnormality.There is a 3.6 x 3.7 cm hypodense, fluid attenuating right renal mass most consistent with a cyst. The kidneys, adrenal glands and pancreas are normal. The bladder is unremarkable.  The stomach, duodenum, small intestine, and large intestine demonstrate no wall thickening or dilatation. There is no pneumoperitoneum, pneumatosis, or portal venous gas. There is no abdominal or pelvic free fluid. There is no lymphadenopathy.  The abdominal aorta is normal in caliber with atherosclerosis.  There are no lytic or sclerotic osseous lesions. There is is degenerative disc disease and facet arthropathy in the lumbar spine. Knee vertebral body heights are maintained and are in normal anatomic alignment.  IMPRESSION: 1. Large right hemothorax. Small right pneumothorax measuring less than 10 percent. Multiple displaced right rib fractures as described above. 2. No acute abdominal or pelvic injury. 3. Multivessel coronary artery atherosclerosis. 4. 2.3 x 1.9 cm hypodense right thyroid mass. If there is further clinical concern, a dedicated non a margin thyroid ultrasound is recommended for better  characterization. These results were called by telephone at the time of interpretation on 02/18/2014 at 10:08 am to [facility], who verbally acknowledged these results.   Electronically Signed   By: Kathreen Devoid  On: 02/18/2014 10:11   Ct Cervical Spine Wo Contrast  02/18/2014   CLINICAL DATA:  Memory loss, head injury after multiple falls in last several days.  EXAM: CT HEAD WITHOUT CONTRAST  CT CERVICAL SPINE WITHOUT CONTRAST  TECHNIQUE: Multidetector CT imaging of the head and cervical spine was performed following the standard protocol without intravenous contrast. Multiplanar CT image reconstructions of the cervical spine were also generated.  COMPARISON:  None.  FINDINGS: CT HEAD FINDINGS  Bony calvarium appears intact. Mild diffuse cortical atrophy is noted. Mild chronic ischemic white matter disease is noted. No mass effect or midline shift is noted. Ventricular size is within normal limits. There is no evidence of mass lesion, hemorrhage or acute infarction.  CT CERVICAL SPINE FINDINGS  No fracture is noted. Minimal grade 1 retrolisthesis is noted at C6-7 secondary to degenerative disc disease at this level. Moderate degenerative disc disease is also noted at C4-5 and C5-6 with anterior osteophyte formation. Incidental note is made of minimal right apical pneumothorax with associated pleural effusion.  IMPRESSION: Mild diffuse cortical atrophy. Mild chronic ischemic white matter disease. No acute intracranial abnormality seen.  Multilevel degenerative disc disease is noted in the lower cervical spine. No fracture or significant spondylolisthesis is noted.  Incidental note is made of minimal right apical pneumothorax with associated pleural effusion. Please refer to CT scan of chest of same day for further discussion.   Electronically Signed   By: Sabino Dick M.D.   On: 02/18/2014 10:07   Ct Abdomen Pelvis W Contrast  02/18/2014   CLINICAL DATA:  Patient's following 2-3 times in the past 2  days. Bruising over the right flank.  EXAM: CT CHEST, ABDOMEN, AND PELVIS WITH CONTRAST  TECHNIQUE: Multidetector CT imaging of the chest, abdomen and pelvis was performed following the standard protocol during bolus administration of intravenous contrast.  CONTRAST:  196mL OMNIPAQUE IOHEXOL 300 MG/ML  SOLN  COMPARISON:  None.  FINDINGS: CT CHEST FINDINGS  The central airways are patent. There is a large right hemothorax. There is a small right pneumothorax measuring less than 10%. There is soft tissue emphysema in the right lateral chest wall. The left lung is clear.  There are no pathologically enlarged axillary, hilar or mediastinal lymph nodes.  The heart size is normal. There is no pericardial effusion. The thoracic aorta is normal in caliber. Extensive multivessel coronary artery atherosclerosis.  There is a 2.3 x 1.9 cm hypodense right thyroid mass.  Review of bone windows demonstrates no focal lytic or sclerotic lesions. There is a nondisplaced fracture of the right posterior eleventh rib at the costovertebral junction. There is a displaced fracture of the posterolateral tenth rib. There are displaced fractures involving the lateral and posterior seventh, eighth and ninth ribs. There is a displaced fracture of the right anterolateral sixth rib. The vertebral body heights are maintained and are in normal anatomic alignment. There are anterior bridging osteophytes of the thoracic spine as can be seen with diffuse idiopathic skeletal hyperostosis.  CT ABDOMEN AND PELVIS FINDINGS  The liver demonstrates no focal abnormality. There is no intrahepatic or extrahepatic biliary ductal dilatation. The gallbladder is normal. The spleen demonstrates no focal abnormality.There is a 3.6 x 3.7 cm hypodense, fluid attenuating right renal mass most consistent with a cyst. The kidneys, adrenal glands and pancreas are normal. The bladder is unremarkable.  The stomach, duodenum, small intestine, and large intestine demonstrate  no wall thickening or dilatation. There is no pneumoperitoneum, pneumatosis, or portal venous gas.  There is no abdominal or pelvic free fluid. There is no lymphadenopathy.  The abdominal aorta is normal in caliber with atherosclerosis.  There are no lytic or sclerotic osseous lesions. There is is degenerative disc disease and facet arthropathy in the lumbar spine. Knee vertebral body heights are maintained and are in normal anatomic alignment.  IMPRESSION: 1. Large right hemothorax. Small right pneumothorax measuring less than 10 percent. Multiple displaced right rib fractures as described above. 2. No acute abdominal or pelvic injury. 3. Multivessel coronary artery atherosclerosis. 4. 2.3 x 1.9 cm hypodense right thyroid mass. If there is further clinical concern, a dedicated non a margin thyroid ultrasound is recommended for better characterization. These results were called by telephone at the time of interpretation on 02/18/2014 at 10:08 am to [facility], who verbally acknowledged these results.   Electronically Signed   By: Kathreen Devoid   On: 02/18/2014 10:11   Dg Chest Port 1 View  02/24/2014   CLINICAL DATA:  80 year old male with rib fractures and pneumothorax. Subsequent encounter.  EXAM: PORTABLE CHEST - 1 VIEW  COMPARISON:  02/23/2014.  FINDINGS: Multiple right-sided rib fractures some of which are displaced.  Small (5% or small) right apical pneumothorax similar to prior exam or minimally smaller.  Opacification right lung base may represent pleural fluid and atelectatic changes. This limits evaluation for detection of underlying infiltrate  Heart size top-normal.  Minimal blunting left costophrenic angle unchanged.  Calcified aorta.  IMPRESSION: Multiple right-sided rib fractures some of which are displaced.  Small (5% or small) right apical pneumothorax similar to prior exam or minimally smaller.  Opacification right lung base may represent pleural fluid and atelectatic changes. This limits  evaluation for detection of underlying infiltrate   Electronically Signed   By: Chauncey Cruel M.D.   On: 02/24/2014 08:07   Dg Chest Port 1 View  02/23/2014   CLINICAL DATA:  Right hemothorax.  EXAM: PORTABLE CHEST - 1 VIEW  COMPARISON:  02/23/2014  FINDINGS: Normal heart size. There has been interval removal of the right chest tube. This measures approximately 8 mm. This is slightly increased in volume from previous exam. Decreased aeration of both lung bases is unchanged from previous exam. Right rib fractures are again noted.  IMPRESSION: 1. Slight increase in volume of right apical pneumothorax after removal of right chest tube. 2. No change in aeration to the lung bases.   Electronically Signed   By: Kerby Moors M.D.   On: 02/23/2014 17:05   Dg Chest Port 1 View  02/23/2014   CLINICAL DATA:  Chest tubes, right hemothorax  EXAM: PORTABLE CHEST - 1 VIEW  COMPARISON:  02/22/2014  FINDINGS: Cardiomediastinal silhouette is stable. Trace left pleural effusion with left basilar atelectasis. Stable right chest tube position. Right lower displaced rib fractures again noted. Stable tiny right apical pneumothorax. Trace left pleural effusion with basilar atelectasis.  IMPRESSION: Trace bilateral pleural effusion with bilateral basilar atelectasis. Stable tiny right apical pneumothorax. Stable right chest tube position.   Electronically Signed   By: Lahoma Crocker M.D.   On: 02/23/2014 08:05   Dg Chest Port 1 View  02/22/2014   CLINICAL DATA:  Pneumothorax, hemothorax, trauma  EXAM: PORTABLE CHEST - 1 VIEW  COMPARISON:  02/21/2014  FINDINGS: Right-sided rib fractures are reidentified. Aeration is decreased since previously. Trace residual right apical pneumothorax is smaller. Right-sided chest tube remains in place. Heart size upper limits of normal. Left lung is clear. Trace bilateral pleural fluid is  present.  IMPRESSION: Decreased, now trace, right apical pneumothorax.  Trace bilateral pleural effusions.    Electronically Signed   By: Conchita Paris M.D.   On: 02/22/2014 09:24   Dg Chest Port 1 View  02/20/2014   CLINICAL DATA:  Shortness of breath.  EXAM: PORTABLE CHEST - 1 VIEW  COMPARISON:  04/22/2013 .  FINDINGS: Right chest tube in stable position. Mediastinum and hilar structures are normal. Heart size stable. Persistent right base atelectasis and/or infiltrate. Subsegmental atelectasis left lung base unchanged. No pleural effusion or pneumothorax. Multiple right rib fractures.  IMPRESSION: 1. Right chest tube in stable position.  No pneumothorax. 2. Stable right base atelectasis and/or infiltrate. Stable subsegmental atelectasis left lung base. 3. Multiple right rib fractures.   Electronically Signed   By: Marcello Moores  Register   On: 02/20/2014 07:57   Dg Chest Port 1 View  02/19/2014   CLINICAL DATA:  Traumatic hemopneumothorax  EXAM: PORTABLE CHEST - 1 VIEW  COMPARISON:  Portable exam 0710 hr compared to 02/18/2014  FINDINGS: RIGHT thoracostomy tube unchanged.  Normal heart size, mediastinal contours and pulmonary vascularity.  Atherosclerotic calcification aorta.  Bibasilar atelectasis.  No significant pleural effusion or pneumothorax identified.  IMPRESSION: Bibasilar atelectasis.   Electronically Signed   By: Lavonia Dana M.D.   On: 02/19/2014 08:13   Dg Chest Port 1 View  02/18/2014   CLINICAL DATA:  Status post fall. Hemopneumothorax on the right. Chest tube placement.  EXAM: PORTABLE CHEST - 1 VIEW  COMPARISON:  CT chest and PA and lateral chest earlier this same day.  FINDINGS: A new right chest tube is in place. Pleural fluid on the right appears decreased. No pneumothorax is identified. Right basilar atelectasis is noted. Multiple right rib fractures are again seen. The left lung is expanded with mild basilar atelectasis noted. Heart size is normal.  IMPRESSION: Some decrease in right pleural fluid after chest tube placement. Negative for pneumothorax.  Right worse than left basilar atelectasis.   Multiple right rib fractures.   Electronically Signed   By: Inge Rise M.D.   On: 02/18/2014 13:08    2D Echo: 02/22/2014: Study Conclusions - Left ventricle: The cavity size was normal. Wall thickness was increased in a pattern of mild LVH. Systolic function was normal. The estimated ejection fraction was in the range of 60% to 65%. Doppler parameters are consistent with abnormal left ventricular relaxation (grade 1 diastolic dysfunction). - Aortic valve: AV is thickened, Calcified with restricted motion Peak and mean gradients through the valve are 70 and 44 mm Hg respectively consistent with severe AS. There was mild regurgitation. - Mitral valve: There was mild regurgitation. - Left atrium: The atrium was mildly dilated. - Pulmonary arteries: PA peak pressure: 41 mm Hg (S).  Admission HPI:  80 y.o PMH DM 2, Afib on Pradaxa, HTN, CAD s/p stents x3, CAS, sciatica, dementia (dx'ed 1-2.5 years ago though declining for the last year). History from wife as patient has dementia and baseline is oriented to wife, sometimes to self and niece [facility]) and friends, not time or year or weekday. Wife states Monday the patient fell and hit his back on the table and felt sore yesterday. This am around 4-5 am patient was getting up from a chair lost his balance and fell and hit his head and fell on his right side and hit the coffee table. Wife denies that fall mechanism was from tripping over object. BP in ED initially 172/71 then 99/57. He was given  Fentanyl 100 x 1, Norco/Vicodin 2 tablets, Idarucizumab 5 mg, Morphine 4 mg x 1, NS 500 cc bolus. CT imaging obtained. Trauma consulted rec. Medicine admit and will follow.   Hospital Course by problem list: Principal Problem:   Hemothorax with pneumothorax, traumatic Active Problems:   Atrial fibrillation   Dementia   Hemothorax, right   Rib fractures   Abnormality of gait   Hypotension   Thrombocytopenia   Thyroid mass    Dyslipidemia   80 year old man on home Pradaxa with traumatic fall with hemothorax, pneumothorax and rib fractures on the right who required chest tube placement and Pradaxa-reversal with Praxbind.   Large, Right-Sided Traumatic Hemothorax: There was initially a small pneumothorax on the right that has resolved on CXR. Chest tube was placed and put on waterseal on 12/2, then removed 12/7. Hemoglobin trended down, then stabilized 13.0-->8.5-->8.8. Has had pain control with scheduled norco. Needs suture removed in 1 week and follow-up with trauma clinic in 6 weeks.  Fragility Fractures, Multiple: Right ribs. Treated with pain control and incentive spirometry, along with PT. Will need an outpatient dexa scan.  Repeated Falls: Found to have severe AS on this admission (with moderate to severe symptomatic AS also visualized in 06/2013). PE is not likely, as patient was on pradaxa. Had a massive CVA in past, but CT now negative with no focal abnormalities. He could have had an acute MI, but the patient's EKG and troponins were negative on admission. Orthostatics + (Sitting 108/47-->Standing 78/54). His labs did not show electrolyte abnormalities. Patient does have dementia (treated with memantine ER 28 mg daily and aricept 10 mg daily). It is very possible that this was a mechanical fall or due to orthostatic hypotension. Most likely AS or orthostasis contributing. To receive PT and OT at home with private 24 hour assistance.  Severe Atrial Stenosis: Visualized on 02/22/14 echo. Moderate symptomatic aortic stenosis also visualized on echo 06/2013 in [place]. Unclear from history whether initial fall was mechanical in the context of dementia or whether patient may have "blacked out" prior to the fall after rising from a chair. Needs outpatient cardiology f/u.  Thrombocytopenia: 130-->120-->118-->117-->107-->116-->154-->189 (resolved). Platelets were 140 per PCP records in 11/2013. I cannot find thrombocytopenia as  a side effect of Praxbind. He is on no other medications that are known to cause thrombocytopenia; simvastatin can cause thrombocytopenia, and he is on atorvastatin. Haloperidol can cause it, and it was mentioned in early notes that this could be given if necessary to the patient; however, it was never given. May have been dilutional.   Paroxysmal Atrial Fibrillation: Bradycardic in NSR for majority of hospitalization. Occasionally went into afib to 190s. Asymptomatic. Was given Pradaxa-reversing agent in ED (Praxbind). CHADS 2 score is 2; CHADS VASc is 4. Holding Pradaxa and Aspirin 81, but continued sotalol 40 mg bid. Will need cardiology f/u. Will restart Pradaxa 2 weeks from discharge as per discussion with Dr. Grandville Silos (trauma surgeon who placed the CT).  Incidental Thyroid Mass: Visualized on imaging. Smaller than 3 cm diameter (2.3 x 1.9 cm right thyroid mass), asymptomatic, TSH WNL. No intervention indicated now; recommend f/u in 6 months  Discharge Vitals:   BP 114/55 mmHg  Pulse 66  Temp(Src) 98.8 F (37.1 C) (Oral)  Resp 17  Ht 6' (1.829 m)  Wt 149 lb 4 oz (67.7 kg)  BMI 20.24 kg/m2  SpO2 97%  Discharge Labs:  Results for orders placed or performed during the hospital encounter of 02/18/14 (from the  past 24 hour(s))  Glucose, capillary     Status: Abnormal   Collection Time: 02/23/14  4:24 PM  Result Value Ref Range   Glucose-Capillary 112 (H) 70 - 99 mg/dL   Comment 1 Documented in Chart    Comment 2 Notify RN   Glucose, capillary     Status: Abnormal   Collection Time: 02/23/14  8:40 PM  Result Value Ref Range   Glucose-Capillary 131 (H) 70 - 99 mg/dL   Comment 1 Documented in Chart    Comment 2 Notify RN   Glucose, capillary     Status: Abnormal   Collection Time: 02/24/14  6:25 AM  Result Value Ref Range   Glucose-Capillary 123 (H) 70 - 99 mg/dL  Glucose, capillary     Status: Abnormal   Collection Time: 02/24/14 11:04 AM  Result Value Ref Range    Glucose-Capillary 146 (H) 70 - 99 mg/dL    Signed: Drucilla Schmidt, MD 02/24/2014, 4:14 PM    Services Ordered on Discharge: HH PT, OT, RN, aide, SW Equipment Ordered on Discharge: wheelchair and left elevated rolling walker

## 2014-02-24 NOTE — Progress Notes (Signed)
[facility]  Progress Note   LOS: 6 days   Subjective: Pt doing well, no trouble breathing, isn't on oxygen today.  Pain well controlled.  No N/V, tolerating diet well.  CT out yesterday.  CXR done and only tiny pneumothorax.    Objective: Vital signs in last 24 hours: Temp:  [98.3 F (36.8 C)-99.7 F (37.6 C)] 98.3 F (36.8 C) (12/08 0622) Pulse Rate:  [65-102] 65 (12/08 0622) Resp:  [17-18] 17 (12/08 0622) BP: (121-153)/(51-72) 140/51 mmHg (12/08 0622) SpO2:  [98 %-99 %] 98 % (12/08 0622) Weight:  [149 lb 4 oz (67.7 kg)] 149 lb 4 oz (67.7 kg) (12/08 0622) Last BM Date:  (unknown)  Lab Results:  CBC  Recent Labs  02/22/14 0526 02/23/14 0628  WBC 7.6 7.9  HGB 8.9* 8.8*  HCT 26.4* 26.9*  PLT 154 189   BMET  Recent Labs  02/23/14 0221  NA 138  K 4.1  CL 101  CO2 26  GLUCOSE 121*  BUN 16  CREATININE 0.93  CALCIUM 8.5    Imaging: Dg Chest Port 1 View  02/23/2014   CLINICAL DATA:  Right hemothorax.  EXAM: PORTABLE CHEST - 1 VIEW  COMPARISON:  02/23/2014  FINDINGS: Normal heart size. There has been interval removal of the right chest tube. This measures approximately 8 mm. This is slightly increased in volume from previous exam. Decreased aeration of both lung bases is unchanged from previous exam. Right rib fractures are again noted.  IMPRESSION: 1. Slight increase in volume of right apical pneumothorax after removal of right chest tube. 2. No change in aeration to the lung bases.   Electronically Signed   By: Kerby Moors M.D.   On: 02/23/2014 17:05   Dg Chest Port 1 View  02/23/2014   CLINICAL DATA:  Chest tubes, right hemothorax  EXAM: PORTABLE CHEST - 1 VIEW  COMPARISON:  02/22/2014  FINDINGS: Cardiomediastinal silhouette is stable. Trace left pleural effusion with left basilar atelectasis. Stable right chest tube position. Right lower displaced rib fractures again noted. Stable tiny right apical pneumothorax. Trace left pleural  effusion with basilar atelectasis.  IMPRESSION: Trace bilateral pleural effusion with bilateral basilar atelectasis. Stable tiny right apical pneumothorax. Stable right chest tube position.   Electronically Signed   By: Lahoma Crocker M.D.   On: 02/23/2014 08:05     PE: General: pleasant, WD/WN white male who is laying in bed in NAD Heart: currently normocardic. Obvious murmur - AS. Palpable radial and pedal pulses bilaterally Lungs: CTAB, no wheezes, rhonchi, or rales noted. IS to 1000. Respiratory effort nonlabored, good effort. Chest wall tenderness on right Abd: soft, NT/ND, +BS, no masses, hernias, or organomegaly MS: all 4 extremities are symmetrical with no cyanosis, clubbing, or edema. Psych: A&Ox3 with an appropriate affect.   Assessment/Plan: Fall ?syncope Multiple right rib fracture - pulm toilet Hemopneumothorax - s/p right chest tube placement 02/18/14 - d/c on 02/23/14, CXR stable can likely d/c to home or SNF.  Would keep dressing in place for 4 days total, and keep wound covered while showering for 1 week.  Sutures will need to be removed in 1 week either at our office or PCP's office.   AODM Afib - chronic anitcoagulation on Pradaxa and ASA, can resume anticoagulation from our perspective per primary service Dementia CAD/Carotid stenosis Hypertension  VTE - SCD's, resume per primary service FEN - carb mod diet Disp - d/c plan per primary service - SNF vs 24hr supervision  Coralie Keens, PA-C Pager: [phone] General Trauma PA Pager: [phone]   02/24/2014

## 2014-02-24 NOTE — Progress Notes (Signed)
  Date: 02/24/2014  Patient name: Eddie [name]  Medical record number: [id number]  Date of birth: April 27, 1932   This patient has been seen and the plan of care was discussed with the house staff. Please see their note for complete details. I concur with their findings with the following additions/corrections: His wife is in the [facility] at her appt. I have been emailing with [name]. Mr Kilburg has no complaints and is happy that he is close to being D/C'd. Finalize dispo plans - likely home with PT/OT/Aide and self pay 24 hr aide.   D/C today or tomorrow.  Bartholomew Crews, MD 02/24/2014, 11:45 AM

## 2014-02-24 NOTE — Care Management Note (Signed)
Page 1 of 2   02/24/2014     2:52:39 PM CARE MANAGEMENT NOTE 02/24/2014  Patient:  RAFAN, SANDERS   Account Number:  [id number]  Date Initiated:  02/23/2014  Documentation initiated by:  [facility]  Subjective/Objective Assessment:   Falls with multiple right rib fracture/s/p right chest tube placement 02/18/14     Action/Plan:   lives at home with wife, Ronzell Laban   Anticipated DC Date:  02/24/2014   Anticipated DC Plan:  [place]  In-house referral  Clinical Social Worker      DC Forensic scientist  CM consult      PAC Choice  [place]   Choice offered to / List presented to:  C-3 Spouse   DME arranged  [place]      DME agency  [place] arranged  HH-1 RN  [place]      [place].   Status of service:  Completed, signed off Medicare Important Message given?  YES (If response is "NO", the following Medicare IM given date fields will be blank) Date Medicare IM given:  02/23/2014 Medicare IM given by:  [facility] Date Additional Medicare IM given:   Additional Medicare IM given by:    Discharge Disposition:  [place]  Per UR Regulation:  Reviewed for med. necessity/level of care/duration of stay  If discussed at [place] of Stay Meetings, dates discussed:   02/24/2014    Comments:  02/24/14- 1400- Marvetta Gibbons RN BSN [phone] Received word from [place] that pt's wife has decided to take pt home and wants [facility]- spoke with MD who confirms plan for Home with [place]- orders placed for HH-RN/PT/OT/Aide/CSW- spoke with pt's wife at bedside- who has called a private duty agency ([employer]) to arrange 24/7 care- representative at bedside- per conversation pt will need DME to include RW with left arm platform and w/c-- orders placed per MD- notified Jermaine with  [facility] about DME needs- equipment to be delivered to room prior to discharge- pt's wife assures staff that 24/7 care will  be arranged, nephew to come give ride home and plan is for nephew to stay tonight-  spoke with Karmen Stabs with [facility] regarding pt's needs and recommendations- per conversation they will be taking referral and can start services in am to assist wife- pt's wife given choice for [facility] agency in [facility]- per choice she would like to use [facility]- referral called to Amy with [facility] -for HH-RN/PT/OT/aide/CSW- services to start within 24-48 hr post discharge.  02/23/2014 1440 NCM spoke to pt's wife, Seve Monette. States they have a LTC policy that was arranged with their financial advisor, [employer]. Policy with [employer], [phone], # [id number]. Attempted call to [facility] to assist pt's wife getting claim started. On hold for 15 mins. Explained to wife she would have to call and wait to speak with rep. Per Susie at [employer], pt has an 50 day elimination period before his benefits start. Pt will need 24 hour supervision if dc home. Wife states she would consider SNF-rehab. Explained CSW will give bed offers. Explained to wife that if [facility] is arranged she will have to arrange a caregiver for 24 hours/7 days a week. States she could afford private duty aide.  Jonnie Finner RN CCM Case Mgmt phone [phone]

## 2014-02-24 NOTE — Progress Notes (Signed)
Assessment unchanged. Discussed d/c instructions with patient including: follow up appointment and medication instructions. Patient verbalized understanding. IV and tele removed. Patient left with all belongings accompanied by NT.  Earlie Lou

## 2014-02-26 DIAGNOSIS — R269 Unspecified abnormalities of gait and mobility: Secondary | ICD-10-CM | POA: Diagnosis not present

## 2014-02-26 DIAGNOSIS — D440 Neoplasm of uncertain behavior of thyroid gland: Secondary | ICD-10-CM | POA: Diagnosis not present

## 2014-02-26 DIAGNOSIS — S272XXD Traumatic hemopneumothorax, subsequent encounter: Secondary | ICD-10-CM | POA: Diagnosis not present

## 2014-02-26 DIAGNOSIS — E785 Hyperlipidemia, unspecified: Secondary | ICD-10-CM | POA: Diagnosis not present

## 2014-02-26 DIAGNOSIS — S2241XD Multiple fractures of ribs, right side, subsequent encounter for fracture with routine healing: Secondary | ICD-10-CM | POA: Diagnosis not present

## 2014-02-26 DIAGNOSIS — W19XXXD Unspecified fall, subsequent encounter: Secondary | ICD-10-CM | POA: Diagnosis not present

## 2014-02-26 DIAGNOSIS — D696 Thrombocytopenia, unspecified: Secondary | ICD-10-CM | POA: Diagnosis not present

## 2014-02-26 DIAGNOSIS — I480 Paroxysmal atrial fibrillation: Secondary | ICD-10-CM | POA: Diagnosis not present

## 2014-02-26 DIAGNOSIS — F0390 Unspecified dementia without behavioral disturbance: Secondary | ICD-10-CM | POA: Diagnosis not present

## 2014-02-26 MED ORDER — MEMANTINE HCL ER 21 MG PO CP24: 28.0000 mg | ORAL_CAPSULE | Freq: Every day | ORAL | Status: DC

## 2014-02-27 DIAGNOSIS — S272XXD Traumatic hemopneumothorax, subsequent encounter: Secondary | ICD-10-CM | POA: Diagnosis not present

## 2014-02-27 DIAGNOSIS — S2241XD Multiple fractures of ribs, right side, subsequent encounter for fracture with routine healing: Secondary | ICD-10-CM | POA: Diagnosis not present

## 2014-02-27 DIAGNOSIS — F0390 Unspecified dementia without behavioral disturbance: Secondary | ICD-10-CM | POA: Diagnosis not present

## 2014-02-27 DIAGNOSIS — I480 Paroxysmal atrial fibrillation: Secondary | ICD-10-CM | POA: Diagnosis not present

## 2014-02-27 DIAGNOSIS — D696 Thrombocytopenia, unspecified: Secondary | ICD-10-CM | POA: Diagnosis not present

## 2014-02-27 DIAGNOSIS — D440 Neoplasm of uncertain behavior of thyroid gland: Secondary | ICD-10-CM | POA: Diagnosis not present

## 2014-03-02 DIAGNOSIS — F0390 Unspecified dementia without behavioral disturbance: Secondary | ICD-10-CM | POA: Diagnosis not present

## 2014-03-02 DIAGNOSIS — I480 Paroxysmal atrial fibrillation: Secondary | ICD-10-CM | POA: Diagnosis not present

## 2014-03-02 DIAGNOSIS — D440 Neoplasm of uncertain behavior of thyroid gland: Secondary | ICD-10-CM | POA: Diagnosis not present

## 2014-03-02 DIAGNOSIS — S272XXD Traumatic hemopneumothorax, subsequent encounter: Secondary | ICD-10-CM | POA: Diagnosis not present

## 2014-03-02 DIAGNOSIS — D696 Thrombocytopenia, unspecified: Secondary | ICD-10-CM | POA: Diagnosis not present

## 2014-03-02 DIAGNOSIS — S2241XD Multiple fractures of ribs, right side, subsequent encounter for fracture with routine healing: Secondary | ICD-10-CM | POA: Diagnosis not present

## 2014-03-03 ENCOUNTER — Encounter: Payer: Self-pay | Admitting: Internal Medicine

## 2014-03-03 ENCOUNTER — Ambulatory Visit (INDEPENDENT_AMBULATORY_CARE_PROVIDER_SITE_OTHER): Payer: Medicare Other | Admitting: Internal Medicine

## 2014-03-03 VITALS — BP 108/44 | HR 79 | Temp 98.2°F | Ht 72.0 in | Wt 150.9 lb

## 2014-03-03 DIAGNOSIS — S21109D Unspecified open wound of unspecified front wall of thorax without penetration into thoracic cavity, subsequent encounter: Secondary | ICD-10-CM

## 2014-03-03 DIAGNOSIS — F0390 Unspecified dementia without behavioral disturbance: Secondary | ICD-10-CM

## 2014-03-03 DIAGNOSIS — Z7982 Long term (current) use of aspirin: Secondary | ICD-10-CM | POA: Diagnosis not present

## 2014-03-03 DIAGNOSIS — D696 Thrombocytopenia, unspecified: Secondary | ICD-10-CM | POA: Diagnosis not present

## 2014-03-03 DIAGNOSIS — I959 Hypotension, unspecified: Secondary | ICD-10-CM

## 2014-03-03 DIAGNOSIS — E079 Disorder of thyroid, unspecified: Secondary | ICD-10-CM

## 2014-03-03 DIAGNOSIS — S272XXD Traumatic hemopneumothorax, subsequent encounter: Secondary | ICD-10-CM

## 2014-03-03 DIAGNOSIS — I480 Paroxysmal atrial fibrillation: Secondary | ICD-10-CM

## 2014-03-03 DIAGNOSIS — I350 Nonrheumatic aortic (valve) stenosis: Secondary | ICD-10-CM

## 2014-03-03 DIAGNOSIS — S2241XD Multiple fractures of ribs, right side, subsequent encounter for fracture with routine healing: Secondary | ICD-10-CM | POA: Diagnosis not present

## 2014-03-03 DIAGNOSIS — D440 Neoplasm of uncertain behavior of thyroid gland: Secondary | ICD-10-CM | POA: Diagnosis not present

## 2014-03-03 LAB — CBC
HEMATOCRIT: 29.8 % — AB (ref 39.0–52.0)
Hemoglobin: 9.7 g/dL — ABNORMAL LOW (ref 13.0–17.0)
MCH: 29.1 pg (ref 26.0–34.0)
MCHC: 32.6 g/dL (ref 30.0–36.0)
MCV: 89.5 fL (ref 78.0–100.0)
MPV: 9.1 fL — ABNORMAL LOW (ref 9.4–12.4)
Platelets: 424 10*3/uL — ABNORMAL HIGH (ref 150–400)
RBC: 3.33 MIL/uL — ABNORMAL LOW (ref 4.22–5.81)
RDW: 13.9 % (ref 11.5–15.5)
WBC: 10.2 10*3/uL (ref 4.0–10.5)

## 2014-03-03 MED ORDER — HYDROCODONE-ACETAMINOPHEN 5-325 MG PO TABS: 1.0000 | ORAL_TABLET | Freq: Four times a day (QID) | ORAL | Status: DC | PRN

## 2014-03-03 NOTE — Assessment & Plan Note (Signed)
Assessment: Incidental finding while admitted. < 3 m with normal TSH.   Plan: -Follow up in 6 months with imaging

## 2014-03-03 NOTE — Patient Instructions (Signed)
General Instructions:  Please bring your medicines with you each time you come to clinic.  Medicines may include prescription medications, over-the-counter medications, herbal remedies, eye drops, vitamins, or other pills.   For your rib fractures and wound from chest tube: -TAKE hydrocodone-acetaminophen 5-325 (1-2 tablets) every 6 hours as needed for pain -Continue to shower and dress as normal -We will check a blood level today -RESTART Pradaxa 150 mg twice a day on 03/10/14  -Continue to follow with physical and occupational therapy -Follow up with Trauma Clinic on 04/15/14  For your heart: -Continue Aspirin 81 mg once a day -Continue Soltalol 40 mg twice a day -RESTART Pradaxa on 03/10/14 -We have made a referral to cardiology and they will call you  For your dementia: -We have made a referral to neurology -Continue your Nameda and Aricept as previously prescribed  Aortic Stenosis Aortic stenosis is a narrowing of the aortic valve. The aortic valve is a gatelike structure that is located between the lower left chamber of the heart (left ventricle) and the blood vessel that leads away from the heart (aorta). When the aortic valve is narrowed, it does not open all the way. This makes it hard for the heart to pump blood into the aorta and causes the heart to work harder. The extra work can weaken the heart over time and lead to heart failure. CAUSES  Causes of aortic stenosis include:  Calcium deposits on the aortic valve that have made the valve stiff. This condition generally affects those over the age of 55. It is the most common cause of aortic stenosis.  A birth defect.  Rheumatic fever. This is a problem that may occur after a strep throat infection that was not treated adequately. Rheumatic fever can cause permanent damage to heart valves. SIGNS AND SYMPTOMS  People with aortic stenosis usually have no symptoms until the condition becomes severe. It may take 10-20 years for  mild or moderate aortic stenosis to become severe. Symptoms may include:   Shortness of breath, especially with physical activity.   Feeling weak and tired (fatigued) or getting tired easily.  Chest discomfort (angina). This may occur with minimal activity if the aortic stenosis is severe.  An irregular or faster-than-normal heartbeat.  Dizziness or fainting that happens with exertion or after taking certain heart medicines (such as nitroglycerin). DIAGNOSIS  Aortic stenosis is usually diagnosed with a physical exam and with a type of imaging test called echocardiography. During echocardiography, sound waves are used to evaluate how blood flows through the heart. If your health care provider suspects aortic stenosis but the test does not clearly show it, a procedure called cardiac catheterization may be done to diagnose the condition. Tests may also be done to evaluate heart function. They may include:  Electrocardiography. During this test, the electrical impulses of the heart are recorded while you are lying down and sticky patches are placed on your chest, arms, and legs.  Stress tests. There is more than one type of stress test. If a stress test is needed, ask your health care provider about which type is best for you.  Blood tests. TREATMENT  Treatment depends on how severe the aortic stenosis is, your symptoms, and the problems it is causing.   Observation. If the aortic stenosis is mild, no treatment may be needed. However, you will need to have the condition checked regularly to make sure it is not getting worse or causing serious problems.  Surgery. Surgery to repair or replace  the aortic valve is the most common treatment for aortic stenosis. Several types of surgeries are available. The most common are open-heart surgery and transcutaneous aortic valve replacement (TAVR). TAVR does not require that the chest be opened. It is usually performed on elderly patients and those who are  not able to have open-heart surgery.  Medicines. Medicines may be given to keep symptoms from getting worse. Medicines cannot reverse aortic stenosis. HOME CARE INSTRUCTIONS   You may need to avoid certain types of physical activity. If your aortic stenosis is mild, you may need to avoid only strenuous activity. The more severe your aortic stenosis, the more activities you will need to avoid. Talk with your health care provider about the types of activity you should avoid.  Take medicines only as directed by your health care provider.  If you are a woman with aortic valve stenosis and want to get pregnant, talk to your health care provider before you become pregnant.  If you are a woman with aortic valve stenosis and are pregnant, keep all follow-up visits with all recommended health care providers.  Keep all follow-up visits for tests, exams, and treatments as directed by your health care provider. SEEK IMMEDIATE MEDICAL CARE IF:  You develop chest pain or tightness.   You develop shortness of breath or difficulty breathing.   You develop light-headedness or faint.   It feels like your heartbeat is irregular or faster than normal.  You have a fever. Document Released: 12/03/2002 Document Revised: 07/21/2013 Document Reviewed: 02/29/2012 [facility] Patient Information 2015 [place], [place]. This information is not intended to replace advice given to you by your health care provider. Make sure you discuss any questions you have with your health care provider.

## 2014-03-03 NOTE — Assessment & Plan Note (Addendum)
Assessment: Patient is status post right sided hemothorax with small PTX and rib fractures after falling on Pradaxa from a syncopal episode. Patient was given Praxbind in the ED. Wound from chest tube is well-healed and closed. I removed 2 sutures today without complications or difficulty. Patient denies shortness of breath. He continues to have right sided rib pain not well controlled on Norco 5-325 1 tab Q6H prn.   Plan: -2 sutures removed -Shower as normal -No dressing needed -Restart Pradaxa on 03/10/14 -Norco 5-325 1-2 tabs Q6H prn pain (take 2 at night to aid with sleep) -Follow up in trauma clinic on 04/15/14 -Recheck CBC today for hemoglobin level -Continue PT/OT -Consider speech therapy in future if hoarse voice does not improve -Consider outpatient DEXA as baseline in future for fragility fracture

## 2014-03-03 NOTE — Assessment & Plan Note (Signed)
-  Continue Aricept 10 mg daily -Continue Namenda 28 mg daily -Referral to Neurology per patient and family request

## 2014-03-03 NOTE — Assessment & Plan Note (Signed)
Assessment: Resolved in hospital from 107>189. Unclear etiology.  Plan: -CBC

## 2014-03-03 NOTE — Assessment & Plan Note (Signed)
Assessment: Patient has severe calcific aortic stenosis as seen on most recent echocardiogram in December 2015. Aortic valve has an area is 0.73 cm2 and is calcified with restricted motion. Peak and mean gradients through the valve are 70 and 44 mm Hg respectively consistent with severe AS. Patient is status post syncopal episode after standing resulting in fall with hemothorax, pneumothorax and right rib fractures. Orthostatics in clinic were as follows: Lying 129/54, HR 73 > Sitting 129/54, HR 75 > Standing 124/64 HR 95.   Plan: -Continue current medications -Referral to Cardiology with questions if patient meets criteria for intervention on severe aortic stenosis.

## 2014-03-03 NOTE — Assessment & Plan Note (Addendum)
Assessment: Patient has low-normal blood pressure. Today, in clinic pressure was 108/44. Orthostatics in clinic were as follows: Lying 129/54, HR 73 > Sitting 129/54, HR 75 > Standing 124/64 HR 95. Patient is only on Soltalol 40 mg BID for atrial fibrillation.   Plan: -Referral to Cardiology -Continue to monitor BP -Continue soltalol -Educated patient to use caution when standing and not to stand up quickly

## 2014-03-03 NOTE — Assessment & Plan Note (Signed)
Assessment: History of PAF. Currently regular rate and rhythm on examination.  Plan: -Continue Soltalol 40 mg BID -Restart Pradaxa 150 mg BID on 03/10/14 -Continue ASA 81 mg daily -Referral to cardiology

## 2014-03-03 NOTE — Progress Notes (Signed)
Subjective:    Patient ID: Eddie [name], male    DOB: 02-11-1933, 80 y.o.   MRN: [id number]  HPI Eddie [name] is a 80 yo male with PMHx of Paroxysmal Atrial Fibrillation, Hypotension, H/o of CVA, Dementia, Thrombocytopenia, HLD, Thyroid Mass, and recent R traumatic hemothorax/ptx with rib fractures who presents for hospital follow up. Please see problem oriented charting for more information.  Review of Systems General: Denies fever, chills, change in appetite and diaphoresis.  Respiratory: Denies SOB, cough, and wheezing.   Cardiovascular: Denies chest pain and palpitations.  Gastrointestinal: Denies nausea, vomiting, abdominal pain Musculoskeletal: Admits to right sided rib pain. Denies myalgias, back pain, joint swelling, arthralgias and gait problem.  Skin: Admits to bruising. Neurological: Admits to occasional dizziness, weakness, lightheadedness. Denies numbness and headache.  Past Medical History  Diagnosis Date  . A-fib   . Dementia   . HTN (hypertension)   . CAD (coronary artery disease)     s/p PCI x 3 (09/2007)  . Carotid artery stenosis     left  . Sciatica   . Pneumothorax 02/18/2014  . Thrombocytopenia   . Thyroid mass    Outpatient Encounter Prescriptions as of 03/03/2014  Medication Sig  . acetaminophen (TYLENOL) 500 MG tablet Take 500 mg by mouth every 6 (six) hours as needed for mild pain.  Marland Kitchen aspirin EC 81 MG tablet Take 81 mg by mouth daily.  Marland Kitchen atorvastatin (LIPITOR) 80 MG tablet Take 80 mg by mouth daily.  Marland Kitchen b complex vitamins tablet Take 1 tablet by mouth daily.  . dabigatran (PRADAXA) 150 MG CAPS capsule Take 1 capsule (150 mg total) by mouth 2 (two) times daily.  Marland Kitchen donepezil (ARICEPT) 10 MG tablet Take 10 mg by mouth daily.  Marland Kitchen ezetimibe (ZETIA) 10 MG tablet Take 10 mg by mouth daily.  . fluticasone (FLONASE) 50 MCG/ACT nasal spray Place 2 sprays into both nostrils daily.  Marland Kitchen gabapentin (NEURONTIN) 400 MG capsule Take 400 mg by mouth daily.  Marland Kitchen  HYDROcodone-acetaminophen (NORCO/VICODIN) 5-325 MG per tablet Take 1 tablet by mouth every 6 (six) hours as needed for moderate pain.  . magnesium oxide (MAG-OX) 400 MG tablet Take 400 mg by mouth daily.  . Memantine HCl ER (NAMENDA XR) 21 MG CP24 Take 28 mg by mouth daily.  . metroNIDAZOLE (METROGEL) 1 % gel Apply 1 application topically 2 (two) times daily as needed (rosacea). To face  . sotalol (BETAPACE) 80 MG tablet Take 40 mg by mouth 2 (two) times daily.      Objective:   Physical Exam Filed Vitals:   03/03/14 1504  BP: 108/44  Pulse: 79  Temp: 98.2 F (36.8 C)  TempSrc: Oral  Height: 6' (1.829 m)  Weight: 150 lb 14.4 oz (68.448 kg)  SpO2: 95%   General: Vital signs reviewed.  Patient is well-developed and well-nourished, in no acute distress and cooperative with exam.  Head: Normocephalic and atraumatic. Eyes: EOMI, conjunctivae normal Cardiovascular: RRR, 3/6 systolic ejection murmur. Pulmonary/Chest: Clear to auscultation bilaterally, no wheezes, rales, or rhonchi. S/p chest tube placement in right lateral chest wall with 2 sutures in place, well-healed and closed. Obvious ecchymosis along right-sided chest wall. Tender on palpation and with movement.  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  Extremities: No lower extremity edema bilaterally. Skin: Ecchymoses, no rashes or erythema. Psychiatric: Normal mood and affect. speech and behavior is normal. Cognition and memory are abnormal.  Assessment & Plan:   Please see problem based assessment and plan.

## 2014-03-04 DIAGNOSIS — D440 Neoplasm of uncertain behavior of thyroid gland: Secondary | ICD-10-CM | POA: Diagnosis not present

## 2014-03-04 DIAGNOSIS — S272XXD Traumatic hemopneumothorax, subsequent encounter: Secondary | ICD-10-CM | POA: Diagnosis not present

## 2014-03-04 DIAGNOSIS — D696 Thrombocytopenia, unspecified: Secondary | ICD-10-CM | POA: Diagnosis not present

## 2014-03-04 DIAGNOSIS — F0390 Unspecified dementia without behavioral disturbance: Secondary | ICD-10-CM | POA: Diagnosis not present

## 2014-03-04 DIAGNOSIS — S2241XD Multiple fractures of ribs, right side, subsequent encounter for fracture with routine healing: Secondary | ICD-10-CM | POA: Diagnosis not present

## 2014-03-04 DIAGNOSIS — I480 Paroxysmal atrial fibrillation: Secondary | ICD-10-CM | POA: Diagnosis not present

## 2014-03-04 NOTE — Progress Notes (Signed)
I saw and evaluated the patient. I personally confirmed the key portions of Dr. Nena Alexander history and exam and reviewed pertinent patient test results. The assessment, diagnosis, and plan were formulated together and I agree with the documentation in the resident's note.  I am concerned that the syncopal episode was related to his severe aortic stenosis and I agree with Dr. Nena Alexander plan to refer to Cardiology to get their opinion on the need for intervention on the severe, and likely symptomatic, aortic stenosis.  I suspect the acute thrombocytopenia that has since resolved was secondary to consumption at the time of the trauma and acute bleed.

## 2014-03-05 ENCOUNTER — Telehealth: Payer: Self-pay | Admitting: *Deleted

## 2014-03-05 DIAGNOSIS — D696 Thrombocytopenia, unspecified: Secondary | ICD-10-CM | POA: Diagnosis not present

## 2014-03-05 DIAGNOSIS — S272XXD Traumatic hemopneumothorax, subsequent encounter: Secondary | ICD-10-CM | POA: Diagnosis not present

## 2014-03-05 DIAGNOSIS — I480 Paroxysmal atrial fibrillation: Secondary | ICD-10-CM | POA: Diagnosis not present

## 2014-03-05 DIAGNOSIS — F0390 Unspecified dementia without behavioral disturbance: Secondary | ICD-10-CM | POA: Diagnosis not present

## 2014-03-05 DIAGNOSIS — D440 Neoplasm of uncertain behavior of thyroid gland: Secondary | ICD-10-CM | POA: Diagnosis not present

## 2014-03-05 DIAGNOSIS — S2241XD Multiple fractures of ribs, right side, subsequent encounter for fracture with routine healing: Secondary | ICD-10-CM | POA: Diagnosis not present

## 2014-03-05 NOTE — Telephone Encounter (Signed)
[facility]N calls and states pt is having some problems with his voice/ speech, his wife states he whispers sometimes, HHn would like a verbal approval for speech therapy, ok is given, are you ok with this?

## 2014-03-05 NOTE — Telephone Encounter (Signed)
Yes, I am okay with this. Thank you!

## 2014-03-06 DIAGNOSIS — S2241XD Multiple fractures of ribs, right side, subsequent encounter for fracture with routine healing: Secondary | ICD-10-CM | POA: Diagnosis not present

## 2014-03-06 DIAGNOSIS — I480 Paroxysmal atrial fibrillation: Secondary | ICD-10-CM | POA: Diagnosis not present

## 2014-03-06 DIAGNOSIS — D440 Neoplasm of uncertain behavior of thyroid gland: Secondary | ICD-10-CM | POA: Diagnosis not present

## 2014-03-06 DIAGNOSIS — F0390 Unspecified dementia without behavioral disturbance: Secondary | ICD-10-CM | POA: Diagnosis not present

## 2014-03-06 DIAGNOSIS — D696 Thrombocytopenia, unspecified: Secondary | ICD-10-CM | POA: Diagnosis not present

## 2014-03-06 DIAGNOSIS — S272XXD Traumatic hemopneumothorax, subsequent encounter: Secondary | ICD-10-CM | POA: Diagnosis not present

## 2014-03-08 DIAGNOSIS — D440 Neoplasm of uncertain behavior of thyroid gland: Secondary | ICD-10-CM | POA: Diagnosis not present

## 2014-03-08 DIAGNOSIS — S272XXD Traumatic hemopneumothorax, subsequent encounter: Secondary | ICD-10-CM | POA: Diagnosis not present

## 2014-03-08 DIAGNOSIS — I480 Paroxysmal atrial fibrillation: Secondary | ICD-10-CM | POA: Diagnosis not present

## 2014-03-08 DIAGNOSIS — D696 Thrombocytopenia, unspecified: Secondary | ICD-10-CM | POA: Diagnosis not present

## 2014-03-08 DIAGNOSIS — S2241XD Multiple fractures of ribs, right side, subsequent encounter for fracture with routine healing: Secondary | ICD-10-CM | POA: Diagnosis not present

## 2014-03-08 DIAGNOSIS — F0390 Unspecified dementia without behavioral disturbance: Secondary | ICD-10-CM | POA: Diagnosis not present

## 2014-03-09 DIAGNOSIS — D696 Thrombocytopenia, unspecified: Secondary | ICD-10-CM | POA: Diagnosis not present

## 2014-03-09 DIAGNOSIS — D440 Neoplasm of uncertain behavior of thyroid gland: Secondary | ICD-10-CM | POA: Diagnosis not present

## 2014-03-09 DIAGNOSIS — S2241XD Multiple fractures of ribs, right side, subsequent encounter for fracture with routine healing: Secondary | ICD-10-CM | POA: Diagnosis not present

## 2014-03-09 DIAGNOSIS — S272XXD Traumatic hemopneumothorax, subsequent encounter: Secondary | ICD-10-CM | POA: Diagnosis not present

## 2014-03-09 DIAGNOSIS — F0390 Unspecified dementia without behavioral disturbance: Secondary | ICD-10-CM | POA: Diagnosis not present

## 2014-03-09 DIAGNOSIS — I480 Paroxysmal atrial fibrillation: Secondary | ICD-10-CM | POA: Diagnosis not present

## 2014-03-10 DIAGNOSIS — S2241XD Multiple fractures of ribs, right side, subsequent encounter for fracture with routine healing: Secondary | ICD-10-CM | POA: Diagnosis not present

## 2014-03-10 DIAGNOSIS — F0390 Unspecified dementia without behavioral disturbance: Secondary | ICD-10-CM | POA: Diagnosis not present

## 2014-03-10 DIAGNOSIS — D440 Neoplasm of uncertain behavior of thyroid gland: Secondary | ICD-10-CM | POA: Diagnosis not present

## 2014-03-10 DIAGNOSIS — I480 Paroxysmal atrial fibrillation: Secondary | ICD-10-CM | POA: Diagnosis not present

## 2014-03-10 DIAGNOSIS — D696 Thrombocytopenia, unspecified: Secondary | ICD-10-CM | POA: Diagnosis not present

## 2014-03-10 DIAGNOSIS — S272XXD Traumatic hemopneumothorax, subsequent encounter: Secondary | ICD-10-CM | POA: Diagnosis not present

## 2014-03-11 DIAGNOSIS — I480 Paroxysmal atrial fibrillation: Secondary | ICD-10-CM | POA: Diagnosis not present

## 2014-03-11 DIAGNOSIS — F0390 Unspecified dementia without behavioral disturbance: Secondary | ICD-10-CM | POA: Diagnosis not present

## 2014-03-11 DIAGNOSIS — S2241XD Multiple fractures of ribs, right side, subsequent encounter for fracture with routine healing: Secondary | ICD-10-CM | POA: Diagnosis not present

## 2014-03-11 DIAGNOSIS — D440 Neoplasm of uncertain behavior of thyroid gland: Secondary | ICD-10-CM | POA: Diagnosis not present

## 2014-03-11 DIAGNOSIS — S272XXD Traumatic hemopneumothorax, subsequent encounter: Secondary | ICD-10-CM | POA: Diagnosis not present

## 2014-03-11 DIAGNOSIS — D696 Thrombocytopenia, unspecified: Secondary | ICD-10-CM | POA: Diagnosis not present

## 2014-03-12 DIAGNOSIS — F0390 Unspecified dementia without behavioral disturbance: Secondary | ICD-10-CM | POA: Diagnosis not present

## 2014-03-12 DIAGNOSIS — S2241XD Multiple fractures of ribs, right side, subsequent encounter for fracture with routine healing: Secondary | ICD-10-CM | POA: Diagnosis not present

## 2014-03-12 DIAGNOSIS — D440 Neoplasm of uncertain behavior of thyroid gland: Secondary | ICD-10-CM | POA: Diagnosis not present

## 2014-03-12 DIAGNOSIS — I480 Paroxysmal atrial fibrillation: Secondary | ICD-10-CM | POA: Diagnosis not present

## 2014-03-12 DIAGNOSIS — S272XXD Traumatic hemopneumothorax, subsequent encounter: Secondary | ICD-10-CM | POA: Diagnosis not present

## 2014-03-12 DIAGNOSIS — D696 Thrombocytopenia, unspecified: Secondary | ICD-10-CM | POA: Diagnosis not present

## 2014-03-16 DIAGNOSIS — F0390 Unspecified dementia without behavioral disturbance: Secondary | ICD-10-CM | POA: Diagnosis not present

## 2014-03-16 DIAGNOSIS — D440 Neoplasm of uncertain behavior of thyroid gland: Secondary | ICD-10-CM | POA: Diagnosis not present

## 2014-03-16 DIAGNOSIS — I480 Paroxysmal atrial fibrillation: Secondary | ICD-10-CM | POA: Diagnosis not present

## 2014-03-16 DIAGNOSIS — D696 Thrombocytopenia, unspecified: Secondary | ICD-10-CM | POA: Diagnosis not present

## 2014-03-16 DIAGNOSIS — S272XXD Traumatic hemopneumothorax, subsequent encounter: Secondary | ICD-10-CM | POA: Diagnosis not present

## 2014-03-16 DIAGNOSIS — S2241XD Multiple fractures of ribs, right side, subsequent encounter for fracture with routine healing: Secondary | ICD-10-CM | POA: Diagnosis not present

## 2014-03-17 DIAGNOSIS — F0390 Unspecified dementia without behavioral disturbance: Secondary | ICD-10-CM | POA: Diagnosis not present

## 2014-03-17 DIAGNOSIS — S272XXD Traumatic hemopneumothorax, subsequent encounter: Secondary | ICD-10-CM | POA: Diagnosis not present

## 2014-03-17 DIAGNOSIS — D440 Neoplasm of uncertain behavior of thyroid gland: Secondary | ICD-10-CM | POA: Diagnosis not present

## 2014-03-17 DIAGNOSIS — D696 Thrombocytopenia, unspecified: Secondary | ICD-10-CM | POA: Diagnosis not present

## 2014-03-17 DIAGNOSIS — S2241XD Multiple fractures of ribs, right side, subsequent encounter for fracture with routine healing: Secondary | ICD-10-CM | POA: Diagnosis not present

## 2014-03-17 DIAGNOSIS — I480 Paroxysmal atrial fibrillation: Secondary | ICD-10-CM | POA: Diagnosis not present

## 2014-03-18 DIAGNOSIS — F0390 Unspecified dementia without behavioral disturbance: Secondary | ICD-10-CM | POA: Diagnosis not present

## 2014-03-18 DIAGNOSIS — S2241XD Multiple fractures of ribs, right side, subsequent encounter for fracture with routine healing: Secondary | ICD-10-CM | POA: Diagnosis not present

## 2014-03-18 DIAGNOSIS — S272XXD Traumatic hemopneumothorax, subsequent encounter: Secondary | ICD-10-CM | POA: Diagnosis not present

## 2014-03-18 DIAGNOSIS — D440 Neoplasm of uncertain behavior of thyroid gland: Secondary | ICD-10-CM | POA: Diagnosis not present

## 2014-03-18 DIAGNOSIS — D696 Thrombocytopenia, unspecified: Secondary | ICD-10-CM | POA: Diagnosis not present

## 2014-03-18 DIAGNOSIS — I480 Paroxysmal atrial fibrillation: Secondary | ICD-10-CM | POA: Diagnosis not present

## 2014-03-23 ENCOUNTER — Telehealth (HOSPITAL_COMMUNITY): Payer: Self-pay

## 2014-03-23 DIAGNOSIS — D696 Thrombocytopenia, unspecified: Secondary | ICD-10-CM | POA: Diagnosis not present

## 2014-03-23 DIAGNOSIS — D440 Neoplasm of uncertain behavior of thyroid gland: Secondary | ICD-10-CM | POA: Diagnosis not present

## 2014-03-23 DIAGNOSIS — S2241XD Multiple fractures of ribs, right side, subsequent encounter for fracture with routine healing: Secondary | ICD-10-CM | POA: Diagnosis not present

## 2014-03-23 DIAGNOSIS — F0390 Unspecified dementia without behavioral disturbance: Secondary | ICD-10-CM | POA: Diagnosis not present

## 2014-03-23 DIAGNOSIS — S272XXD Traumatic hemopneumothorax, subsequent encounter: Secondary | ICD-10-CM | POA: Diagnosis not present

## 2014-03-23 DIAGNOSIS — I480 Paroxysmal atrial fibrillation: Secondary | ICD-10-CM | POA: Diagnosis not present

## 2014-03-23 NOTE — Telephone Encounter (Signed)
Wife called about appt that was scheduled for late January but [facility] apparently has no record of it. They have been to see IM already. I told her to stay under care of IM MD and to call us as needed.

## 2014-03-25 DIAGNOSIS — D440 Neoplasm of uncertain behavior of thyroid gland: Secondary | ICD-10-CM | POA: Diagnosis not present

## 2014-03-25 DIAGNOSIS — S2241XD Multiple fractures of ribs, right side, subsequent encounter for fracture with routine healing: Secondary | ICD-10-CM | POA: Diagnosis not present

## 2014-03-25 DIAGNOSIS — F0390 Unspecified dementia without behavioral disturbance: Secondary | ICD-10-CM | POA: Diagnosis not present

## 2014-03-25 DIAGNOSIS — S272XXD Traumatic hemopneumothorax, subsequent encounter: Secondary | ICD-10-CM | POA: Diagnosis not present

## 2014-03-25 DIAGNOSIS — D696 Thrombocytopenia, unspecified: Secondary | ICD-10-CM | POA: Diagnosis not present

## 2014-03-25 DIAGNOSIS — I480 Paroxysmal atrial fibrillation: Secondary | ICD-10-CM | POA: Diagnosis not present

## 2014-03-27 ENCOUNTER — Ambulatory Visit: Payer: Medicare Other | Admitting: Internal Medicine

## 2014-03-30 DIAGNOSIS — R490 Dysphonia: Secondary | ICD-10-CM | POA: Diagnosis not present

## 2014-03-30 DIAGNOSIS — J387 Other diseases of larynx: Secondary | ICD-10-CM | POA: Diagnosis not present

## 2014-03-30 DIAGNOSIS — R1310 Dysphagia, unspecified: Secondary | ICD-10-CM | POA: Diagnosis not present

## 2014-03-31 DIAGNOSIS — D440 Neoplasm of uncertain behavior of thyroid gland: Secondary | ICD-10-CM | POA: Diagnosis not present

## 2014-03-31 DIAGNOSIS — S272XXD Traumatic hemopneumothorax, subsequent encounter: Secondary | ICD-10-CM | POA: Diagnosis not present

## 2014-03-31 DIAGNOSIS — D696 Thrombocytopenia, unspecified: Secondary | ICD-10-CM | POA: Diagnosis not present

## 2014-03-31 DIAGNOSIS — I480 Paroxysmal atrial fibrillation: Secondary | ICD-10-CM | POA: Diagnosis not present

## 2014-03-31 DIAGNOSIS — S2241XD Multiple fractures of ribs, right side, subsequent encounter for fracture with routine healing: Secondary | ICD-10-CM | POA: Diagnosis not present

## 2014-03-31 DIAGNOSIS — F0390 Unspecified dementia without behavioral disturbance: Secondary | ICD-10-CM | POA: Diagnosis not present

## 2014-04-01 ENCOUNTER — Ambulatory Visit (INDEPENDENT_AMBULATORY_CARE_PROVIDER_SITE_OTHER): Payer: Medicare Other | Admitting: Neurology

## 2014-04-01 ENCOUNTER — Encounter: Payer: Self-pay | Admitting: Neurology

## 2014-04-01 VITALS — BP 126/76 | HR 64 | Resp 16 | Wt 137.0 lb

## 2014-04-01 DIAGNOSIS — G309 Alzheimer's disease, unspecified: Secondary | ICD-10-CM | POA: Diagnosis not present

## 2014-04-01 DIAGNOSIS — R55 Syncope and collapse: Secondary | ICD-10-CM

## 2014-04-01 DIAGNOSIS — F0280 Dementia in other diseases classified elsewhere without behavioral disturbance: Secondary | ICD-10-CM

## 2014-04-01 MED ORDER — MEMANTINE HCL ER 21 MG PO CP24: 28.0000 mg | ORAL_CAPSULE | Freq: Every day | ORAL | Status: DC

## 2014-04-01 MED ORDER — DONEPEZIL HCL 10 MG PO TABS: 10.0000 mg | ORAL_TABLET | Freq: Every day | ORAL | Status: DC

## 2014-04-01 NOTE — Progress Notes (Addendum)
[facility]  PATIENT: Eddie [name] DOB: Nov 09, 1932  REFERRING CLINICIAN: Osa Craver HISTORY FROM: Patient, spouse and caregivers REASON FOR VISIT: Dementia and episode of syncope   HISTORICAL  CHIEF COMPLAINT:  Chief Complaint  Patient presents with  . Dementia    HISTORY OF PRESENT ILLNESS:  I had the pleasure seeing you patient, Eddie [name], at [facility] for a neurologic consultation regarding his dementia and a recent fall from syncope     He has had progressive memory decline over the past 5 years.   He was officially diagnosed with Alzheimer 1 1/2 years ago.  He was placed on donepezil and then Namenda 28 mg was added.    He has some trouble swallowing those medications but he otherwise tolerates them well.    He was evaluated recently by ENT and wife says so source of swallowing dysfunction was found.    He is able to do self care (eats, showers, toilet).   Short-term memory is poor. 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 A few years ago he was able to do [place] puzzles but now is unable to do simple math. He needs to be reminded to do things in the house. He does have an aide help him move and help him get socks on.    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     He was hospitalized in December after multiple falls CT scan of the head and cervical spine were personally reviewed. Wife notes he stood up from a chair and felt lighthedaded and fell, fracturing some right ribs and getting a right hemopneumothorax.   Head CT shows mild overall cortical atrophy that was moderate in the mesial temporal lobes and parietal lobes. There were no acute findings. CT scan of the cervical spine shows degenerative changes at C4-C5 through C6-C7. That hospital stay, he was found to have a hemopneumothorax (was taking Pradaxa). The falls were felt to be due to syncope, possibly from his aortic  stenosis and paroxysmal atrial fibrillation leading to hypotension.   REVIEW OF SYSTEMS:  Constitutional: No fevers, chills, sweats, or change in appetite Eyes: No visual changes, double vision, eye pain Ear, nose and throat: No hearing loss, ear pain, nasal congestion, sore throat Cardiovascular: No cardiac chest pain,  He has Afib (on Pradaxa) Respiratory:  No shortness of breath at rest or with exertion.   No wheezes GastrointestinaI: No nausea, vomiting, diarrhea, abdominal pain, fecal incontinence Genitourinary:  No dysuria, urinary retention or frequency.  No nocturia. Musculoskeletal:  No neck pain, back pain Integumentary: No rash, pruritus, skin lesions Neurological: as above Psychiatric: No depression at this time.  No anxiety Endocrine: No diaphoresis, change in appetite, change in weigh or increased thirst.   He is often cold Hematologic/Lymphatic:  No anemia, purpura, petechiae. Allergic/Immunologic: No itchy/runny eyes, nasal congestion, recent allergic reactions, rashes  ALLERGIES: No Known Allergies  HOME MEDICATIONS: Outpatient Prescriptions Prior to Visit  Medication Sig Dispense Refill  . acetaminophen (TYLENOL) 500 MG tablet Take 500 mg by mouth every 6 (six) hours as needed for mild pain.    Marland Kitchen aspirin EC 81 MG tablet Take 81 mg by mouth daily.    Marland Kitchen atorvastatin (LIPITOR) 80 MG tablet Take 80 mg by mouth daily.    Marland Kitchen b complex vitamins tablet Take 1 tablet by mouth daily.    . dabigatran (PRADAXA) 150 MG CAPS capsule  Take 1 capsule (150 mg total) by mouth 2 (two) times daily. 60 capsule   . ezetimibe (ZETIA) 10 MG tablet Take 10 mg by mouth daily.    . fluticasone (FLONASE) 50 MCG/ACT nasal spray Place 2 sprays into both nostrils daily.    Marland Kitchen gabapentin (NEURONTIN) 400 MG capsule Take 400 mg by mouth daily.    Marland Kitchen HYDROcodone-acetaminophen (NORCO/VICODIN) 5-325 MG per tablet Take 1-2 tablets by mouth every 6 (six) hours as needed for moderate pain or severe pain. 56  tablet 0  . magnesium oxide (MAG-OX) 400 MG tablet Take 400 mg by mouth daily.    . metroNIDAZOLE (METROGEL) 1 % gel Apply 1 application topically 2 (two) times daily as needed (rosacea). To face    . sotalol (BETAPACE) 80 MG tablet Take 40 mg by mouth 2 (two) times daily.    Marland Kitchen donepezil (ARICEPT) 10 MG tablet Take 10 mg by mouth daily.    . Memantine HCl ER (NAMENDA XR) 21 MG CP24 Take 28 mg by mouth daily. 30 capsule 0   No facility-administered medications prior to visit.    PAST MEDICAL HISTORY: Past Medical History  Diagnosis Date  . A-fib   . Dementia   . HTN (hypertension)   . CAD (coronary artery disease)     s/p PCI x 3 (09/2007)  . Carotid artery stenosis     left  . Sciatica   . Pneumothorax 02/18/2014  . Thrombocytopenia   . Thyroid mass   . Memory loss   . Syncope and collapse   . Vision abnormalities     PAST SURGICAL HISTORY: Past Surgical History  Procedure Laterality Date  . Coronary stent placement  2009  . Hernia repair    . Carotid endarterectomy    . Cataract extraction    . Chest tube insertion Right 02/18/2014  . Inguinal hernia repair    . Tonsillectomy    . Coronary angioplasty with stent placement    . Achilles tendon repair Left     FAMILY HISTORY: Family History  Problem Relation Age of Onset  . Heart disease Mother   . Diabetes Father     SOCIAL HISTORY:  History   Social History  . Marital Status: Married    Spouse Name: N/A    Number of Children: N/A  . Years of Education: N/A   Occupational History  . Not on file.   Social History Main Topics  . Smoking status: Never Smoker   . Smokeless tobacco: Never Used  . Alcohol Use: Yes     Comment: occasionally   . Drug Use: No  . Sexual Activity: Not on file   Other Topics Concern  . Not on file   Social History Narrative   Married   No children but nieces and nephews and friends    Recently moved to [facility] 01/2014 to be closer to above    Denies h/o smoking, caffeine      Lives at home with wife at [employer]    No PCP possibly interested in Dr. Hartley Barefoot.      PHYSICAL EXAM  Filed Vitals:   04/01/14 1349  BP: 126/76  Pulse: 64  Resp: 16  Weight: 137 lb (62.143 kg)    Body mass index is 18.58 kg/(m^2).   General: The patient is well-developed and well-nourished and in no acute distress  Eyes:  Funduscopic exam shows normal optic discs and retinal vessels.  Neck: The neck is supple, no  carotid bruits are noted.  The neck is nontender.   Range of motion is normal  Respiratory: The respiratory examination is clear.  Cardiovascular: The cardiovascular examination reveals a regular rate and rhythm, no murmurs, gallops or rubs are noted.  Skin: Extremities are without significant edema.  Neurologic Exam  Mental status: The patient is alert and oriented x 1 (name not place/time) at the time of the examination. The patient has very pooral recent memory, with reduced attention span (WORLD-?; serial 3's   20 - 18 - ?).  Clock shows numbers spaced ok but error at 12 oclock.  He can not put hands of clock in.   Pattern is mildly reduced.   Speech is normal . He has little spontaneous output.  Cranial nerves: Extraocular movements are full. Pupils are equal, round, and reactive to light and accomodation.  Visual fields are full.  Facial symmetry is present. There is good facial sensation to soft touch bilaterally.Facial strength is normal.  Trapezius and sternocleidomastoid strength is normal. No dysarthria is noted.  The tongue is midline, and the patient has symmetric elevation of the soft palate. No obvious hearing deficits are noted.  Motor:  Muscle bulk and tone are normal. Strength is  5 / 5 in all 4 extremities.   Sensory: Sensory testing is intact to pinprick, soft touch, vibration sensation, and position sense on all 4 extremities.  Coordination: Cerebellar testing reveals good finger-nose-finger and heel-to-shin  bilaterally.  Gait and station: Station and gait are normal for his age.. Tandem gait is mildly wide but probably normal for age. Romberg is negative.   Reflexes: Deep tendon reflexes are symmetric and normal bilaterally. Plantar responses are normal.    DIAGNOSTIC DATA (LABS, IMAGING, TESTING) - I reviewed patient records, labs, notes, testing and imaging myself where available.  Lab Results  Component Value Date   WBC 10.2 03/03/2014   HGB 9.7* 03/03/2014   HCT 29.8* 03/03/2014   MCV 89.5 03/03/2014   PLT 424* 03/03/2014      Component Value Date/Time   NA 138 02/23/2014 0221   K 4.1 02/23/2014 0221   CL 101 02/23/2014 0221   CO2 26 02/23/2014 0221   GLUCOSE 121* 02/23/2014 0221   BUN 16 02/23/2014 0221   CREATININE 0.93 02/23/2014 0221   CALCIUM 8.5 02/23/2014 0221   PROT 6.4 02/18/2014 0723   ALBUMIN 3.7 02/18/2014 0723   AST 30 02/18/2014 0723   ALT 25 02/18/2014 0723   ALKPHOS 63 02/18/2014 0723   BILITOT 0.9 02/18/2014 0723   GFRNONAA 78* 02/23/2014 0221   GFRAA >90 02/23/2014 0221   No results found for: CHOL, HDL, LDLCALC, LDLDIRECT, TRIG, CHOLHDL Lab Results  Component Value Date   HGBA1C 5.8* 02/18/2014   No results found for: OEUMPNTI14 Lab Results  Component Value Date   TSH 0.924 02/18/2014      ASSESSMENT AND PLAN  In summary, Eddie [name] is an 81 year old man with dementia that has progressed over the past 5 years the short-term memory seems to be the most involved and he did better with visual spatial tasks and executive function than expected. He tolerates Namenda and Aricept well and we will continue these at current doses. He seems to have an adequate amount of help at the house as there are two health aides. The syncope was preceded by lightheadedness and occurred upon standing. He often feels lightheaded when he stands. Therefore, this is most likely related or related to his cardiac issues including aortic  stenosis and A. fib.  He will  return to see me in 6 months or sooner if he has new or worsening neurologic symptoms.   Thank you very much for asking me to see Eddie [name] for neurologic consultation. Please let me know if I can be of further assistance with her or other patients in the future.    Richard A. Felecia Shelling, MD, PhD 0/03/7492, 4:96 PM Certified in Neurology, Clinical Neurophysiology, Sleep Medicine, Pain Medicine and Neuroimaging  [facility] [address], [place], [place] [address] [phone]   Addendum: Orthostatic vital signs were performed. The blood pressure sitting was 176/76 with a pulse of 64. Standing, the blood pressure was 116/70 with a pulse of 68

## 2014-04-02 DIAGNOSIS — F0390 Unspecified dementia without behavioral disturbance: Secondary | ICD-10-CM | POA: Diagnosis not present

## 2014-04-02 DIAGNOSIS — I480 Paroxysmal atrial fibrillation: Secondary | ICD-10-CM | POA: Diagnosis not present

## 2014-04-02 DIAGNOSIS — D696 Thrombocytopenia, unspecified: Secondary | ICD-10-CM | POA: Diagnosis not present

## 2014-04-02 DIAGNOSIS — D440 Neoplasm of uncertain behavior of thyroid gland: Secondary | ICD-10-CM | POA: Diagnosis not present

## 2014-04-02 DIAGNOSIS — S272XXD Traumatic hemopneumothorax, subsequent encounter: Secondary | ICD-10-CM | POA: Diagnosis not present

## 2014-04-02 DIAGNOSIS — S2241XD Multiple fractures of ribs, right side, subsequent encounter for fracture with routine healing: Secondary | ICD-10-CM | POA: Diagnosis not present

## 2014-04-05 DIAGNOSIS — D440 Neoplasm of uncertain behavior of thyroid gland: Secondary | ICD-10-CM | POA: Diagnosis not present

## 2014-04-05 DIAGNOSIS — D696 Thrombocytopenia, unspecified: Secondary | ICD-10-CM | POA: Diagnosis not present

## 2014-04-05 DIAGNOSIS — F0390 Unspecified dementia without behavioral disturbance: Secondary | ICD-10-CM | POA: Diagnosis not present

## 2014-04-05 DIAGNOSIS — I480 Paroxysmal atrial fibrillation: Secondary | ICD-10-CM | POA: Diagnosis not present

## 2014-04-05 DIAGNOSIS — S2241XD Multiple fractures of ribs, right side, subsequent encounter for fracture with routine healing: Secondary | ICD-10-CM | POA: Diagnosis not present

## 2014-04-05 DIAGNOSIS — S272XXD Traumatic hemopneumothorax, subsequent encounter: Secondary | ICD-10-CM | POA: Diagnosis not present

## 2014-04-07 DIAGNOSIS — S0502XA Injury of conjunctiva and corneal abrasion without foreign body, left eye, initial encounter: Secondary | ICD-10-CM | POA: Diagnosis not present

## 2014-04-08 DIAGNOSIS — S272XXD Traumatic hemopneumothorax, subsequent encounter: Secondary | ICD-10-CM | POA: Diagnosis not present

## 2014-04-08 DIAGNOSIS — S2241XD Multiple fractures of ribs, right side, subsequent encounter for fracture with routine healing: Secondary | ICD-10-CM | POA: Diagnosis not present

## 2014-04-08 DIAGNOSIS — D440 Neoplasm of uncertain behavior of thyroid gland: Secondary | ICD-10-CM | POA: Diagnosis not present

## 2014-04-08 DIAGNOSIS — F0390 Unspecified dementia without behavioral disturbance: Secondary | ICD-10-CM | POA: Diagnosis not present

## 2014-04-08 DIAGNOSIS — S0502XD Injury of conjunctiva and corneal abrasion without foreign body, left eye, subsequent encounter: Secondary | ICD-10-CM | POA: Diagnosis not present

## 2014-04-08 DIAGNOSIS — D696 Thrombocytopenia, unspecified: Secondary | ICD-10-CM | POA: Diagnosis not present

## 2014-04-08 DIAGNOSIS — I480 Paroxysmal atrial fibrillation: Secondary | ICD-10-CM | POA: Diagnosis not present

## 2014-04-09 ENCOUNTER — Ambulatory Visit: Payer: Medicare Other | Admitting: Internal Medicine

## 2014-04-13 ENCOUNTER — Encounter: Payer: Medicare Other | Admitting: Cardiovascular Disease

## 2014-04-13 NOTE — Progress Notes (Signed)
Patient ID: Eddie [name], male   DOB: 02/15/1933, 81 y.o.   MRN: [id number]    81 yo referred by neurology for syncope.  Patient has progressive dementia over last 5 years Admitted recently with fall that appears to be related to orthostasis, afib and AS Reviewed office note from Dr Felecia Shelling and he documents a 60 mmHg systolic fall in BP.  Was on pradaxa with CHADVASC score of 4 when he had his traumatic pneomo On 02/18/14  Agent reversed and held for 2 weeks.  Also history of CAD with stents done in 2009  Echo done 12/15 shows severe AS  Study Conclusions  - Left ventricle: The cavity size was normal. Wall thickness was increased in a pattern of mild LVH. Systolic function was normal. The estimated ejection fraction was in the range of 60% to 65%. Doppler parameters are consistent with abnormal left ventricular relaxation (grade 1 diastolic dysfunction). - Aortic valve: AV is thickened, Calcified with restricted motion Peak and mean gradients through the valve are 70 and 44 mm Hg respectively consistent with severe AS. There was mild regurgitation. - Mitral valve: There was mild regurgitation. - Left atrium: The atrium was mildly dilated. - Pulmonary arteries: PA peak pressure: 41 mm Hg (S).   ROS: Denies fever, malais, weight loss, blurry vision, decreased visual acuity, cough, sputum, SOB, hemoptysis, pleuritic pain, palpitaitons, heartburn, abdominal pain, melena, lower extremity edema, claudication, or rash.  All other systems reviewed and negative   General: Affect appropriate Healthy:  appears stated age 9: normal Neck supple with no adenopathy JVP normal no bruits no thyromegaly Lungs clear with no wheezing and good diaphragmatic motion Heart:  S1/S2 no murmur,rub, gallop or click PMI normal Abdomen: benighn, BS positve, no tenderness, no AAA no bruit.  No HSM or HJR Distal pulses intact with no bruits No edema Neuro non-focal Skin warm and dry No  muscular weakness  Medications Current Outpatient Prescriptions  Medication Sig Dispense Refill  . acetaminophen (TYLENOL) 500 MG tablet Take 500 mg by mouth every 6 (six) hours as needed for mild pain.    Marland Kitchen aspirin EC 81 MG tablet Take 81 mg by mouth daily.    Marland Kitchen atorvastatin (LIPITOR) 80 MG tablet Take 80 mg by mouth daily.    Marland Kitchen b complex vitamins tablet Take 1 tablet by mouth daily.    . cholecalciferol (VITAMIN D) 1000 UNITS tablet Take 1,000 Units by mouth daily.    . dabigatran (PRADAXA) 150 MG CAPS capsule Take 1 capsule (150 mg total) by mouth 2 (two) times daily. 60 capsule   . donepezil (ARICEPT) 10 MG tablet Take 1 tablet (10 mg total) by mouth daily. 30 tablet 11  . ezetimibe (ZETIA) 10 MG tablet Take 10 mg by mouth daily.    . fluticasone (FLONASE) 50 MCG/ACT nasal spray Place 2 sprays into both nostrils daily.    Marland Kitchen gabapentin (NEURONTIN) 400 MG capsule Take 400 mg by mouth daily.    Marland Kitchen HYDROcodone-acetaminophen (NORCO/VICODIN) 5-325 MG per tablet Take 1-2 tablets by mouth every 6 (six) hours as needed for moderate pain or severe pain. 56 tablet 0  . magnesium oxide (MAG-OX) 400 MG tablet Take 400 mg by mouth daily.    . Memantine HCl ER (NAMENDA XR) 21 MG CP24 Take 28 mg by mouth daily. 30 capsule 11  . metroNIDAZOLE (METROGEL) 1 % gel Apply 1 application topically 2 (two) times daily as needed (rosacea). To face    . sotalol (BETAPACE) 80 MG tablet  Take 40 mg by mouth 2 (two) times daily.     No current facility-administered medications for this visit.    Allergies Review of patient's allergies indicates no known allergies.  Family History: Family History  Problem Relation Age of Onset  . Heart disease Mother   . Diabetes Father     Social History: History   Social History  . Marital Status: Married    Spouse Name: N/A    Number of Children: N/A  . Years of Education: N/A   Occupational History  . Not on file.   Social History Main Topics  . Smoking status:  Never Smoker   . Smokeless tobacco: Never Used  . Alcohol Use: Yes     Comment: occasionally   . Drug Use: No  . Sexual Activity: Not on file   Other Topics Concern  . Not on file   Social History Narrative   Married   No children but nieces and nephews and friends    Recently moved to [facility] 01/2014 to be closer to above    Denies h/o smoking, caffeine    Lives at home with wife at [employer]    No PCP possibly interested in Dr. Hartley Barefoot.     Past Surgical History  Procedure Laterality Date  . Coronary stent placement  2009  . Hernia repair    . Carotid endarterectomy    . Cataract extraction    . Chest tube insertion Right 02/18/2014  . Inguinal hernia repair    . Tonsillectomy    . Coronary angioplasty with stent placement    . Achilles tendon repair Left     Past Medical History  Diagnosis Date  . A-fib   . Dementia   . HTN (hypertension)   . CAD (coronary artery disease)     s/p PCI x 3 (09/2007)  . Carotid artery stenosis     left  . Sciatica   . Pneumothorax 02/18/2014  . Thrombocytopenia   . Thyroid mass   . Memory loss   . Syncope and collapse   . Vision abnormalities     Electrocardiogram:  ECG 12/15  Atrial flutter rate 142   Assessment and Plan

## 2014-04-15 ENCOUNTER — Other Ambulatory Visit: Payer: Self-pay | Admitting: Geriatric Medicine

## 2014-04-15 DIAGNOSIS — E119 Type 2 diabetes mellitus without complications: Secondary | ICD-10-CM | POA: Diagnosis not present

## 2014-04-15 DIAGNOSIS — E079 Disorder of thyroid, unspecified: Secondary | ICD-10-CM | POA: Diagnosis not present

## 2014-04-15 DIAGNOSIS — G301 Alzheimer's disease with late onset: Secondary | ICD-10-CM | POA: Diagnosis not present

## 2014-04-15 DIAGNOSIS — E780 Pure hypercholesterolemia: Secondary | ICD-10-CM | POA: Diagnosis not present

## 2014-04-15 DIAGNOSIS — I4891 Unspecified atrial fibrillation: Secondary | ICD-10-CM | POA: Diagnosis not present

## 2014-04-26 DIAGNOSIS — D696 Thrombocytopenia, unspecified: Secondary | ICD-10-CM | POA: Diagnosis not present

## 2014-04-26 DIAGNOSIS — D440 Neoplasm of uncertain behavior of thyroid gland: Secondary | ICD-10-CM | POA: Diagnosis not present

## 2014-04-26 DIAGNOSIS — S2241XD Multiple fractures of ribs, right side, subsequent encounter for fracture with routine healing: Secondary | ICD-10-CM | POA: Diagnosis not present

## 2014-04-26 DIAGNOSIS — F0390 Unspecified dementia without behavioral disturbance: Secondary | ICD-10-CM | POA: Diagnosis not present

## 2014-04-26 DIAGNOSIS — S272XXD Traumatic hemopneumothorax, subsequent encounter: Secondary | ICD-10-CM | POA: Diagnosis not present

## 2014-04-26 DIAGNOSIS — I480 Paroxysmal atrial fibrillation: Secondary | ICD-10-CM | POA: Diagnosis not present

## 2014-04-28 ENCOUNTER — Ambulatory Visit
Admission: RE | Admit: 2014-04-28 | Discharge: 2014-04-28 | Disposition: A | Discharge status: Home / Self Care | Payer: Medicare Other | Source: Ambulatory Visit | Attending: Geriatric Medicine | Admitting: Geriatric Medicine

## 2014-04-28 DIAGNOSIS — E079 Disorder of thyroid, unspecified: Secondary | ICD-10-CM

## 2014-04-28 DIAGNOSIS — E041 Nontoxic single thyroid nodule: Secondary | ICD-10-CM | POA: Diagnosis not present

## 2014-04-29 ENCOUNTER — Other Ambulatory Visit: Payer: Self-pay | Admitting: Geriatric Medicine

## 2014-04-29 ENCOUNTER — Encounter (HOSPITAL_COMMUNITY): Payer: Self-pay | Admitting: *Deleted

## 2014-04-29 ENCOUNTER — Emergency Department (HOSPITAL_COMMUNITY): Payer: Medicare Other

## 2014-04-29 ENCOUNTER — Observation Stay (HOSPITAL_COMMUNITY)
Admission: EM | Admit: 2014-04-29 | Discharge: 2014-05-01 | Disposition: A | Discharge status: Home / Self Care | Payer: Medicare Other | Attending: Internal Medicine | Admitting: Internal Medicine

## 2014-04-29 DIAGNOSIS — G309 Alzheimer's disease, unspecified: Secondary | ICD-10-CM | POA: Insufficient documentation

## 2014-04-29 DIAGNOSIS — I350 Nonrheumatic aortic (valve) stenosis: Secondary | ICD-10-CM | POA: Diagnosis not present

## 2014-04-29 DIAGNOSIS — Z7982 Long term (current) use of aspirin: Secondary | ICD-10-CM | POA: Diagnosis not present

## 2014-04-29 DIAGNOSIS — J90 Pleural effusion, not elsewhere classified: Secondary | ICD-10-CM | POA: Diagnosis not present

## 2014-04-29 DIAGNOSIS — R531 Weakness: Secondary | ICD-10-CM

## 2014-04-29 DIAGNOSIS — F0280 Dementia in other diseases classified elsewhere without behavioral disturbance: Secondary | ICD-10-CM | POA: Insufficient documentation

## 2014-04-29 DIAGNOSIS — R451 Restlessness and agitation: Secondary | ICD-10-CM

## 2014-04-29 DIAGNOSIS — I2510 Atherosclerotic heart disease of native coronary artery without angina pectoris: Secondary | ICD-10-CM | POA: Diagnosis not present

## 2014-04-29 DIAGNOSIS — E079 Disorder of thyroid, unspecified: Secondary | ICD-10-CM | POA: Diagnosis present

## 2014-04-29 DIAGNOSIS — I480 Paroxysmal atrial fibrillation: Secondary | ICD-10-CM | POA: Diagnosis present

## 2014-04-29 DIAGNOSIS — R404 Transient alteration of awareness: Secondary | ICD-10-CM | POA: Diagnosis not present

## 2014-04-29 DIAGNOSIS — R55 Syncope and collapse: Secondary | ICD-10-CM | POA: Diagnosis not present

## 2014-04-29 DIAGNOSIS — I951 Orthostatic hypotension: Secondary | ICD-10-CM | POA: Diagnosis present

## 2014-04-29 DIAGNOSIS — E785 Hyperlipidemia, unspecified: Secondary | ICD-10-CM | POA: Diagnosis not present

## 2014-04-29 DIAGNOSIS — Z955 Presence of coronary angioplasty implant and graft: Secondary | ICD-10-CM | POA: Diagnosis not present

## 2014-04-29 DIAGNOSIS — E041 Nontoxic single thyroid nodule: Secondary | ICD-10-CM

## 2014-04-29 DIAGNOSIS — F0390 Unspecified dementia without behavioral disturbance: Secondary | ICD-10-CM | POA: Diagnosis present

## 2014-04-29 DIAGNOSIS — E440 Moderate protein-calorie malnutrition: Secondary | ICD-10-CM

## 2014-04-29 DIAGNOSIS — I10 Essential (primary) hypertension: Secondary | ICD-10-CM | POA: Diagnosis not present

## 2014-04-29 DIAGNOSIS — Z66 Do not resuscitate: Secondary | ICD-10-CM | POA: Diagnosis not present

## 2014-04-29 LAB — CBC WITH DIFFERENTIAL/PLATELET
BASOS PCT: 0 % (ref 0–1)
Basophils Absolute: 0 10*3/uL (ref 0.0–0.1)
Eosinophils Absolute: 0.2 10*3/uL (ref 0.0–0.7)
Eosinophils Relative: 4 % (ref 0–5)
HEMATOCRIT: 37.7 % — AB (ref 39.0–52.0)
HEMOGLOBIN: 11.6 g/dL — AB (ref 13.0–17.0)
LYMPHS PCT: 24 % (ref 12–46)
Lymphs Abs: 1.5 10*3/uL (ref 0.7–4.0)
MCH: 26.1 pg (ref 26.0–34.0)
MCHC: 30.8 g/dL (ref 30.0–36.0)
MCV: 84.9 fL (ref 78.0–100.0)
MONO ABS: 0.8 10*3/uL (ref 0.1–1.0)
MONOS PCT: 13 % — AB (ref 3–12)
NEUTROS ABS: 3.8 10*3/uL (ref 1.7–7.7)
NEUTROS PCT: 59 % (ref 43–77)
Platelets: 207 10*3/uL (ref 150–400)
RBC: 4.44 MIL/uL (ref 4.22–5.81)
RDW: 16.2 % — AB (ref 11.5–15.5)
WBC: 6.4 10*3/uL (ref 4.0–10.5)

## 2014-04-29 LAB — I-STAT CG4 LACTIC ACID, ED: LACTIC ACID, VENOUS: 0.78 mmol/L (ref 0.5–2.0)

## 2014-04-29 LAB — I-STAT TROPONIN, ED: Troponin i, poc: 0 ng/mL (ref 0.00–0.08)

## 2014-04-29 MED ORDER — SODIUM CHLORIDE 0.9 % IV BOLUS (SEPSIS)
1000.0000 mL | Freq: Once | INTRAVENOUS | Status: AC
  Administered 2014-04-29: 1000 mL via INTRAVENOUS

## 2014-04-29 NOTE — ED Notes (Signed)
Pt's wife reports the pt had a syncopal episode tonight, pt got dizzy and started to faint. PT was caught and brought to the floor, BP taken 67/46. Pt is A&O X4 at this time. BP 119/43 now.

## 2014-04-29 NOTE — ED Provider Notes (Signed)
CSN: [id number]     Arrival date & time 04/29/14  2320 History  This chart was scribed for Eddie Balls, MD by Delphia Grates, ED Scribe. This patient was seen in room D32C/D32C and the patient's care was started at 11:23 PM.   Chief Complaint  Patient presents with  . Fatigue    The history is provided by the patient, the EMS personnel and the spouse. No language interpreter was used.     HPI Comments: Eddie [name] is a 81 y.o. male, with history of dementia, HTN, and CAD, brought in by ambulance, who presents to the Emergency Department complaining of fatigue that began PTA. Per EMS, patient states that he did not feel right upon standing. EMS measured his BP to be 142/62 while sitting and 112/52 upon standing. Patient is from home and has home care 24 hours a day, 7 days a week. There is no associated subjective fever. He denies cough or changes in BM. Patient has no complaints.  Per wife, that patient stood up and walked a small distance before passing out. She report his BP prior to arrival of EMS was 80/40. She states he denies pain anywhere. She notes the patient has history of alzheimer's and does not communicate very well. She also notes the patient has also been irritable.   Past Medical History  Diagnosis Date  . A-fib   . Dementia   . HTN (hypertension)   . CAD (coronary artery disease)     s/p PCI x 3 (09/2007)  . Carotid artery stenosis     left  . Sciatica   . Pneumothorax 02/18/2014  . Thrombocytopenia   . Thyroid mass   . Memory loss   . Syncope and collapse   . Vision abnormalities    Past Surgical History  Procedure Laterality Date  . Coronary stent placement  2009  . Hernia repair    . Carotid endarterectomy    . Cataract extraction    . Chest tube insertion Right 02/18/2014  . Inguinal hernia repair    . Tonsillectomy    . Coronary angioplasty with stent placement    . Achilles tendon repair Left    Family History  Problem Relation Age of Onset  .  Heart disease Mother   . Diabetes Father    History  Substance Use Topics  . Smoking status: Never Smoker   . Smokeless tobacco: Never Used  . Alcohol Use: Yes     Comment: occasionally     Review of Systems  10 Systems reviewed and all are negative for acute change except as noted in the HPI.    Allergies  Review of patient's allergies indicates no known allergies.  Home Medications   Prior to Admission medications   Medication Sig Start Date End Date Taking? Authorizing Provider  acetaminophen (TYLENOL) 500 MG tablet Take 500 mg by mouth every 6 (six) hours as needed for mild pain.    Historical Provider, MD  aspirin EC 81 MG tablet Take 81 mg by mouth daily.    Historical Provider, MD  atorvastatin (LIPITOR) 80 MG tablet Take 80 mg by mouth daily.    Historical Provider, MD  b complex vitamins tablet Take 1 tablet by mouth daily.    Historical Provider, MD  cholecalciferol (VITAMIN D) 1000 UNITS tablet Take 1,000 Units by mouth daily.    Historical Provider, MD  dabigatran (PRADAXA) 150 MG CAPS capsule Take 1 capsule (150 mg total) by mouth 2 (two) times daily.  02/24/14   Drucilla Schmidt, MD  donepezil (ARICEPT) 10 MG tablet Take 1 tablet (10 mg total) by mouth daily. 04/01/14   Richard A. Sater, MD  ezetimibe (ZETIA) 10 MG tablet Take 10 mg by mouth daily.    Historical Provider, MD  fluticasone (FLONASE) 50 MCG/ACT nasal spray Place 2 sprays into both nostrils daily.    Historical Provider, MD  gabapentin (NEURONTIN) 400 MG capsule Take 400 mg by mouth daily.    Historical Provider, MD  HYDROcodone-acetaminophen (NORCO/VICODIN) 5-325 MG per tablet Take 1-2 tablets by mouth every 6 (six) hours as needed for moderate pain or severe pain. 03/03/14   Osa Craver, MD  magnesium oxide (MAG-OX) 400 MG tablet Take 400 mg by mouth daily.    Historical Provider, MD  Memantine HCl ER (NAMENDA XR) 21 MG CP24 Take 28 mg by mouth daily. 04/01/14   Richard A. Sater, MD  metroNIDAZOLE  (METROGEL) 1 % gel Apply 1 application topically 2 (two) times daily as needed (rosacea). To face    Historical Provider, MD  sotalol (BETAPACE) 80 MG tablet Take 40 mg by mouth 2 (two) times daily.    Historical Provider, MD   There were no vitals taken for this visit. Physical Exam  Constitutional: Vital signs are normal. He appears well-developed and well-nourished.  Non-toxic appearance. He does not appear ill. No distress.  HENT:  Head: Normocephalic and atraumatic.  Nose: Nose normal.  Mouth/Throat: Oropharynx is clear and moist. No oropharyngeal exudate.  Eyes: Conjunctivae and EOM are normal. Pupils are equal, round, and reactive to light. No scleral icterus.  Neck: Normal range of motion. Neck supple. No tracheal deviation, no edema, no erythema and normal range of motion present. No thyroid mass and no thyromegaly present.  Cardiovascular: Normal rate, regular rhythm, S1 normal, S2 normal, normal heart sounds, intact distal pulses and normal pulses.  Exam reveals no gallop and no friction rub.   No murmur heard. Pulses:      Radial pulses are 2+ on the right side, and 2+ on the left side.       Dorsalis pedis pulses are 2+ on the right side, and 2+ on the left side.  Pulmonary/Chest: Effort normal and breath sounds normal. No respiratory distress. He has no wheezes. He has no rhonchi. He has no rales.  Abdominal: Soft. Normal appearance and bowel sounds are normal. He exhibits no distension, no ascites and no mass. There is no hepatosplenomegaly. There is no tenderness. There is no rebound, no guarding and no CVA tenderness.  Musculoskeletal: Normal range of motion. He exhibits no edema or tenderness.  L hand is notably smaller than the right  Lymphadenopathy:    He has no cervical adenopathy.  Neurological: He is alert. He has normal strength. No cranial nerve deficit or sensory deficit. He exhibits normal muscle tone.  Normal strength and sensation x 4  Skin: Skin is warm, dry  and intact. No petechiae and no rash noted. He is not diaphoretic. No erythema. No pallor.  Psychiatric:  Confused, but able to follow commands.  Nursing note and vitals reviewed.   ED Course  Procedures (including critical care time)   COORDINATION OF CARE: At 2330 Discussed treatment plan with patient and wife. Patient and wife agree.   Labs Review Labs Reviewed  CBC WITH DIFFERENTIAL/PLATELET - Abnormal; Notable for the following:    Hemoglobin 11.6 (*)    HCT 37.7 (*)    RDW 16.2 (*)    Monocytes Relative  13 (*)    All other components within normal limits  COMPREHENSIVE METABOLIC PANEL - Abnormal; Notable for the following:    GFR calc non Af Amer 77 (*)    GFR calc Af Amer 90 (*)    Anion gap 4 (*)    All other components within normal limits  PROTIME-INR - Abnormal; Notable for the following:    Prothrombin Time 19.3 (*)    INR 1.61 (*)    All other components within normal limits  LIPASE, BLOOD  URINALYSIS, ROUTINE W REFLEX MICROSCOPIC  ETHANOL  I-STAT TROPOININ, ED  I-STAT CG4 LACTIC ACID, ED    Imaging Review Dg Chest 2 View  04/29/2014   CLINICAL DATA:  Acute onset of generalized weakness. Initial encounter.  EXAM: CHEST  2 VIEW  COMPARISON:  Chest radiograph performed 02/24/2014  FINDINGS: The lungs are well-aerated. A small right pleural effusion is again noted, improved from the prior study. There is no evidence of pneumothorax.  The heart is normal in size; the mediastinal contour is within normal limits. Previously noted displaced rib fractures are again seen, with minimal callus formation.  IMPRESSION: Small right pleural effusion is improved from the prior study. Multiple displaced right-sided rib fractures again noted, similar in appearance to the prior study, with minimal associated callus formation.   Electronically Signed   By: Garald Balding M.D.   On: 04/29/2014 23:59   Ct Head Wo Contrast  04/30/2014   CLINICAL DATA:  Syncopal episode tonight.  Previous syncopal episodes in December. Dementia.  EXAM: CT HEAD WITHOUT CONTRAST  TECHNIQUE: Contiguous axial images were obtained from the base of the skull through the vertex without intravenous contrast.  COMPARISON:  03/10/2014  FINDINGS: Diffuse cerebral atrophy. Ventricular dilatation consistent with central atrophy. Low-attenuation changes in the deep white matter consistent with small vessel ischemia. No mass effect or midline shift. No abnormal extra-axial fluid collections. Gray-white matter junctions are distinct. Basal cisterns are not effaced. No evidence of acute intracranial hemorrhage. No depressed skull fractures. Visualized paranasal sinuses and mastoid air cells are not opacified. Vascular calcifications.  IMPRESSION: No acute intracranial abnormalities. Chronic atrophy and small vessel ischemic changes. Similar appearance to prior study.   Electronically Signed   By: Lucienne Capers M.D.   On: 04/30/2014 01:15   US Soft Tissue Head/neck  04/28/2014   CLINICAL DATA:  Right thyroid mass noted on CT chest  EXAM: THYROID ULTRASOUND  TECHNIQUE: Ultrasound examination of the thyroid gland and adjacent soft tissues was performed.  COMPARISON:  None.  FINDINGS: Right thyroid lobe  Measurements: 4.9 x 2.5 x 2.4 cm. Heterogeneous tissue. 2.8 x 2.2 x 2.3 cm complex solid and cystic lesion in the lower pole.  Left thyroid lobe  Measurements: 4.5 x 1.3 x 1.6 cm. Heterogeneous tissue. Very small left-sided nodules measuring 6 mm or less.  Isthmus  Thickness: 3 mm.  No nodules visualized.  Lymphadenopathy  None visualized.  IMPRESSION: Bilateral nodules. Dominant complex right lower pole nodule measures 2.8 cm. Findings meet consensus criteria for biopsy. Ultrasound-guided fine needle aspiration should be considered, as per the consensus statement: Management of Thyroid Nodules Detected at [place]: Society of Radiologists in [place]. Radiology 2005; [id number].   Electronically  Signed   By: Marybelle Killings M.D.   On: 04/28/2014 15:38     EKG Interpretation None    MUSE not working: NSR, rate 63 no ischemia, RSR'  MDM   Final diagnoses:  Weakness   Patient since  emergency department for orthostasis. Patient has a documented history of this during his last admission. Per his wife, the patient also fell at home. Patient will be given IV fluids emergency department, CT head an infectious workup was ordered.  CT head is negative, infectious workup reveals a negative chest x-ray. Urinalysis is pending. Patient does have episodes of bradycardia in the emergency department down to the 30s, this was not EKG, per RN Report Patient Was Not Symptomatic during These Episodes. He Admitted to Telemetry Unit for Orthostatic Hypotension and Syncopal Episode at Home.  I personally performed the services described in this documentation, which was scribed in my presence. The recorded information has been reviewed and is accurate.   Eddie Balls, MD 04/30/14 [phone]

## 2014-04-30 ENCOUNTER — Emergency Department (HOSPITAL_COMMUNITY): Payer: Medicare Other

## 2014-04-30 DIAGNOSIS — R55 Syncope and collapse: Secondary | ICD-10-CM | POA: Insufficient documentation

## 2014-04-30 DIAGNOSIS — I480 Paroxysmal atrial fibrillation: Secondary | ICD-10-CM

## 2014-04-30 DIAGNOSIS — F0390 Unspecified dementia without behavioral disturbance: Secondary | ICD-10-CM | POA: Diagnosis not present

## 2014-04-30 DIAGNOSIS — I951 Orthostatic hypotension: Secondary | ICD-10-CM

## 2014-04-30 DIAGNOSIS — I350 Nonrheumatic aortic (valve) stenosis: Secondary | ICD-10-CM

## 2014-04-30 DIAGNOSIS — E079 Disorder of thyroid, unspecified: Secondary | ICD-10-CM | POA: Diagnosis not present

## 2014-04-30 DIAGNOSIS — E440 Moderate protein-calorie malnutrition: Secondary | ICD-10-CM

## 2014-04-30 LAB — CBC WITH DIFFERENTIAL/PLATELET
BASOS PCT: 1 % (ref 0–1)
Basophils Absolute: 0 10*3/uL (ref 0.0–0.1)
EOS ABS: 0.2 10*3/uL (ref 0.0–0.7)
EOS PCT: 3 % (ref 0–5)
HCT: 34.7 % — ABNORMAL LOW (ref 39.0–52.0)
Hemoglobin: 10.5 g/dL — ABNORMAL LOW (ref 13.0–17.0)
Lymphocytes Relative: 18 % (ref 12–46)
Lymphs Abs: 1 10*3/uL (ref 0.7–4.0)
MCH: 26.3 pg (ref 26.0–34.0)
MCHC: 30.3 g/dL (ref 30.0–36.0)
MCV: 86.8 fL (ref 78.0–100.0)
MONO ABS: 0.4 10*3/uL (ref 0.1–1.0)
Monocytes Relative: 8 % (ref 3–12)
NEUTROS ABS: 3.8 10*3/uL (ref 1.7–7.7)
Neutrophils Relative %: 70 % (ref 43–77)
Platelets: 172 10*3/uL (ref 150–400)
RBC: 4 MIL/uL — ABNORMAL LOW (ref 4.22–5.81)
RDW: 16.4 % — AB (ref 11.5–15.5)
WBC: 5.4 10*3/uL (ref 4.0–10.5)

## 2014-04-30 LAB — COMPREHENSIVE METABOLIC PANEL
ALK PHOS: 76 U/L (ref 39–117)
ALT: 17 U/L (ref 0–53)
ALT: 22 U/L (ref 0–53)
AST: 21 U/L (ref 0–37)
AST: 24 U/L (ref 0–37)
Albumin: 2.9 g/dL — ABNORMAL LOW (ref 3.5–5.2)
Albumin: 3.5 g/dL (ref 3.5–5.2)
Alkaline Phosphatase: 93 U/L (ref 39–117)
Anion gap: 4 — ABNORMAL LOW (ref 5–15)
Anion gap: 8 (ref 5–15)
BUN: 13 mg/dL (ref 6–23)
BUN: 19 mg/dL (ref 6–23)
CALCIUM: 8.6 mg/dL (ref 8.4–10.5)
CALCIUM: 9.2 mg/dL (ref 8.4–10.5)
CO2: 27 mmol/L (ref 19–32)
CO2: 32 mmol/L (ref 19–32)
CREATININE: 0.83 mg/dL (ref 0.50–1.35)
CREATININE: 0.93 mg/dL (ref 0.50–1.35)
Chloride: 104 mmol/L (ref 96–112)
Chloride: 108 mmol/L (ref 96–112)
GFR calc Af Amer: >90 mL/min (ref >90)
GFR, EST AFRICAN AMERICAN: 90 mL/min — AB (ref >90)
GFR, EST NON AFRICAN AMERICAN: 81 mL/min — AB (ref >90)
GFR, EST NON AFRICAN AMERICAN: 77 mL/min — AB (ref >90)
GLUCOSE: 139 mg/dL — AB (ref 70–99)
GLUCOSE: 98 mg/dL (ref 70–99)
Potassium: 3.9 mmol/L (ref 3.5–5.1)
Potassium: 4.2 mmol/L (ref 3.5–5.1)
Sodium: 140 mmol/L (ref 135–145)
Sodium: 143 mmol/L (ref 135–145)
TOTAL PROTEIN: 5.7 g/dL — AB (ref 6.0–8.3)
Total Bilirubin: 0.4 mg/dL (ref 0.3–1.2)
Total Bilirubin: 0.7 mg/dL (ref 0.3–1.2)
Total Protein: 7 g/dL (ref 6.0–8.3)

## 2014-04-30 LAB — URINALYSIS, ROUTINE W REFLEX MICROSCOPIC
Bilirubin Urine: NEGATIVE
Glucose, UA: NEGATIVE mg/dL
Hgb urine dipstick: NEGATIVE
Ketones, ur: NEGATIVE mg/dL
Leukocytes, UA: NEGATIVE
Nitrite: NEGATIVE
PROTEIN: NEGATIVE mg/dL
Specific Gravity, Urine: 1.014 (ref 1.005–1.030)
UROBILINOGEN UA: 0.2 mg/dL (ref 0.0–1.0)
pH: 5.5 (ref 5.0–8.0)

## 2014-04-30 LAB — PROTIME-INR
INR: 1.61 — AB (ref 0.00–1.49)
Prothrombin Time: 19.3 seconds — ABNORMAL HIGH (ref 11.6–15.2)

## 2014-04-30 LAB — LIPASE, BLOOD: Lipase: 44 U/L (ref 11–59)

## 2014-04-30 LAB — ETHANOL: Alcohol, Ethyl (B): <5 mg/dL (ref 0–9)

## 2014-04-30 LAB — GLUCOSE, CAPILLARY: Glucose-Capillary: 155 mg/dL — ABNORMAL HIGH (ref 70–99)

## 2014-04-30 MED ORDER — SOTALOL HCL 80 MG PO TABS
40.0000 mg | ORAL_TABLET | Freq: Two times a day (BID) | ORAL | Status: DC
  Administered 2014-04-30 – 2014-05-01 (×3): 40 mg via ORAL
  Filled 2014-04-30 (×4): qty 0.5

## 2014-04-30 MED ORDER — GABAPENTIN 400 MG PO CAPS
400.0000 mg | ORAL_CAPSULE | Freq: Every day | ORAL | Status: DC
  Administered 2014-04-30 – 2014-05-01 (×2): 400 mg via ORAL
  Filled 2014-04-30 (×2): qty 1

## 2014-04-30 MED ORDER — FLUTICASONE PROPIONATE 50 MCG/ACT NA SUSP
2.0000 | Freq: Every day | NASAL | Status: DC
Start: 2014-04-30 — End: 2014-05-01
  Administered 2014-05-01: 2 via NASAL
  Filled 2014-04-30: qty 16

## 2014-04-30 MED ORDER — ENSURE COMPLETE PO LIQD
237.0000 mL | Freq: Two times a day (BID) | ORAL | Status: DC
  Administered 2014-04-30 – 2014-05-01 (×3): 237 mL via ORAL

## 2014-04-30 MED ORDER — DABIGATRAN ETEXILATE MESYLATE 150 MG PO CAPS
150.0000 mg | ORAL_CAPSULE | Freq: Two times a day (BID) | ORAL | Status: DC
  Administered 2014-04-30 – 2014-05-01 (×3): 150 mg via ORAL
  Filled 2014-04-30 (×5): qty 1

## 2014-04-30 MED ORDER — ONDANSETRON HCL 4 MG/2ML IJ SOLN: 4.0000 mg | Freq: Four times a day (QID) | INTRAMUSCULAR | Status: DC | PRN

## 2014-04-30 MED ORDER — ACETAMINOPHEN 325 MG PO TABS: 650.0000 mg | ORAL_TABLET | Freq: Four times a day (QID) | ORAL | Status: DC | PRN

## 2014-04-30 MED ORDER — DONEPEZIL HCL 10 MG PO TABS
10.0000 mg | ORAL_TABLET | Freq: Every day | ORAL | Status: DC
  Administered 2014-04-30 – 2014-05-01 (×2): 10 mg via ORAL
  Filled 2014-04-30 (×2): qty 1

## 2014-04-30 MED ORDER — EZETIMIBE 10 MG PO TABS
10.0000 mg | ORAL_TABLET | Freq: Every day | ORAL | Status: DC
  Administered 2014-04-30 – 2014-05-01 (×2): 10 mg via ORAL
  Filled 2014-04-30 (×2): qty 1

## 2014-04-30 MED ORDER — ATORVASTATIN CALCIUM 80 MG PO TABS
80.0000 mg | ORAL_TABLET | Freq: Every day | ORAL | Status: DC
  Administered 2014-04-30 – 2014-05-01 (×2): 80 mg via ORAL
  Filled 2014-04-30 (×2): qty 1

## 2014-04-30 MED ORDER — SODIUM CHLORIDE 0.9 % IJ SOLN
3.0000 mL | Freq: Two times a day (BID) | INTRAMUSCULAR | Status: DC
  Administered 2014-04-30 – 2014-05-01 (×4): 3 mL via INTRAVENOUS

## 2014-04-30 MED ORDER — MEMANTINE HCL ER 28 MG PO CP24
28.0000 mg | ORAL_CAPSULE | Freq: Every day | ORAL | Status: DC
  Administered 2014-04-30 – 2014-05-01 (×2): 28 mg via ORAL
  Filled 2014-04-30 (×2): qty 1

## 2014-04-30 MED ORDER — ACETAMINOPHEN 650 MG RE SUPP: 650.0000 mg | Freq: Four times a day (QID) | RECTAL | Status: DC | PRN

## 2014-04-30 MED ORDER — ASPIRIN EC 81 MG PO TBEC
81.0000 mg | DELAYED_RELEASE_TABLET | Freq: Every day | ORAL | Status: DC
  Administered 2014-04-30 – 2014-05-01 (×2): 81 mg via ORAL
  Filled 2014-04-30 (×2): qty 1

## 2014-04-30 MED ORDER — ONDANSETRON HCL 4 MG PO TABS: 4.0000 mg | ORAL_TABLET | Freq: Four times a day (QID) | ORAL | Status: DC | PRN

## 2014-04-30 NOTE — Progress Notes (Signed)
INITIAL NUTRITION ASSESSMENT  DOCUMENTATION CODES Per approved criteria  -Non-severe (moderate) malnutrition in the context of chronic illness  Pt meets criteria for MODERATE MALNUTRITION in the context of CHRONIC ILLNESS as evidenced by 8% weight loss in less than 2 months and moderate muscle and fat wasting.  INTERVENTION: Continue Ensure Complete BID, Ensure Complete po BID, each supplement provides 350 kcal and 13 grams of protein Encourage PO intake  NUTRITION DIAGNOSIS: Unintentional weight loss related to medical conditions and advanced age as evidenced by 8% weight loss in less than 2 months.   Goal: Pt to meet >/= 90% of their estimated nutrition needs   Monitor:  PO intake, weight trend, labs  Reason for Assessment: Malnutrition Screening Tool, score of 3  81 y.o. male  Admitting Dx: Orthostatic syncope  ASSESSMENT: 81 y.o. male with Past medical history of A. fib, dementia, hypertension, coronary artery disease, severe aortic stenosis, thyroid mass.  Difficulty obtaining history from patient- shrugged his shoulders in response to many questions. Pt's wife at bedside reports that patient has been eating normally with 2 meals and one snack daily. He started drinking Ensure once daily 4 weeks ago due to weight loss the past couple months. Per weight history, pt's weight has been stable for the past month. Per nursing notes, pt ate 25% of breakfast this morning. Pt drinking Ensure at time of visit.   Labs: low hemoglobin  Nutrition Focused Physical Exam:  Subcutaneous Fat:  Orbital Region: mild wasting Upper Arm Region: moderate wasting Thoracic and Lumbar Region: NA  Muscle:  Temple Region: moderate wasting Clavicle Bone Region: moderate wasting Clavicle and Acromion Bone Region: moderate wasting Scapular Bone Region: NA Dorsal Hand: moderate wasting Patellar Region: moderate wasting Anterior Thigh Region: moderate/severe wasting Posterior Calf Region:  NA  Edema: none noted   Height: Ht Readings from Last 1 Encounters:  04/30/14 6' (1.829 m)    Weight: Wt Readings from Last 1 Encounters:  04/30/14 138 lb 9.6 oz (62.869 kg)    Ideal Body Weight: 178 lbs  % Ideal Body Weight: 78%  Wt Readings from Last 10 Encounters:  04/30/14 138 lb 9.6 oz (62.869 kg)  04/01/14 137 lb (62.143 kg)  03/03/14 150 lb 14.4 oz (68.448 kg)  02/24/14 149 lb 4 oz (67.7 kg)    Usual Body Weight: 150 lbs  % Usual Body Weight: 92%  BMI:  Body mass index is 18.79 kg/(m^2).  Estimated Nutritional Needs: Kcal: 1700-1900 Protein: 80-90 grams Fluid: 1.7-1.9 L/day  Skin: intact  Diet Order: Diet Heart  EDUCATION NEEDS: -No education needs identified at this time   Intake/Output Summary (Last 24 hours) at 04/30/14 1215 Last data filed at 04/30/14 1157  Gross per 24 hour  Intake    723 ml  Output    950 ml  Net   -227 ml    Last BM: 2/10   Labs:   Recent Labs Lab 04/29/14 2332 04/30/14 0920  NA 140 143  K 3.9 4.2  CL 104 108  CO2 32 27  BUN 19 13  CREATININE 0.93 0.83  CALCIUM 9.2 8.6  GLUCOSE 98 139*    CBG (last 3)   Recent Labs  04/30/14 0655  GLUCAP 155*    Scheduled Meds: . aspirin EC  81 mg Oral Daily  . atorvastatin  80 mg Oral Daily  . dabigatran  150 mg Oral Q12H  . donepezil  10 mg Oral Daily  . ezetimibe  10 mg Oral Daily  .  feeding supplement (ENSURE COMPLETE)  237 mL Oral BID BM  . fluticasone  2 spray Each Nare Daily  . gabapentin  400 mg Oral Daily  . memantine  28 mg Oral Daily  . sodium chloride  3 mL Intravenous Q12H  . sotalol  40 mg Oral BID    Continuous Infusions:   Past Medical History  Diagnosis Date  . A-fib   . Dementia   . HTN (hypertension)   . CAD (coronary artery disease)     s/p PCI x 3 (09/2007)  . Carotid artery stenosis     left  . Sciatica   . Pneumothorax 02/18/2014  . Thrombocytopenia   . Thyroid mass   . Memory loss   . Syncope and collapse   . Vision  abnormalities     Past Surgical History  Procedure Laterality Date  . Coronary stent placement  2009  . Hernia repair    . Carotid endarterectomy    . Cataract extraction    . Chest tube insertion Right 02/18/2014  . Inguinal hernia repair    . Tonsillectomy    . Coronary angioplasty with stent placement    . Achilles tendon repair Left     Pryor Ochoa RD, LDN Inpatient Clinical Dietitian Pager: [phone] After Hours Pager: [phone]'

## 2014-04-30 NOTE — ED Notes (Signed)
Pt was seen here on Dec 2nd for fall and broke ribs, he has been having syncopal episodes. Pt passed out tonight but did not fall he was caught by someone and guided down to the floor. Pt denies any pain. Per wife pt is supposed to follow up with cardiology later this month for syncopal episodes.

## 2014-04-30 NOTE — H&P (Signed)
[facility] History and Physical  Patient: Eddie [name]  MRN: [id number]  DOB: [birth year]  DOS: the patient was seen and examined on 04/30/2014 PCP: Mathews Argyle, MD  Chief Complaint: Fall  HPI: Eddie [name] is a 81 y.o. male with Past medical history of A. fib, dementia, hypertension, coronary artery disease, severe aortic stenosis, thyroid mass. The patient is presenting with fall. The history was obtained from family members the patient was not communicating significantly. Patient has dementia and was admitted in December after a fall causing hemopneumothorax requiring chest tube. Patient's Pradaxa was resumed on the time of discharge. Patient was referred to cardiology as an outpatient. Patient was also evaluated by neurology who started the patient on Aricept. Patient continued to have orthostatic hypotension based on documentation from neurology notes. Today the patient was walking with a 24-hour caregiver and passed out. Patient denies to me that he has any dizziness but mentions he has worsening of his chronic speech difficulty before the fall on the time. His blood pressure was 60s over 40s and therefore he was brought here. At the time of my evaluation the patient denies any complaint of headache, dizziness, lightheadedness, focal deficit, chest pain, palpitation, nausea, vomiting, diarrhea, burning urination, cough. Patient had cardiac angiography on [place] with PCI 3 and then he has been following up with cardiology at [place]. And recently moved here in [place]  The patient is coming from home. And at his baseline dependent for most of his ADL.  Review of Systems: as mentioned in the history of present illness.  A Comprehensive review of the other systems is negative.  Past Medical History  Diagnosis Date  . A-fib   . Dementia   . HTN (hypertension)   . CAD (coronary artery disease)     s/p PCI x 3 (09/2007)  . Carotid artery stenosis      left  . Sciatica   . Pneumothorax 02/18/2014  . Thrombocytopenia   . Thyroid mass   . Memory loss   . Syncope and collapse   . Vision abnormalities    Past Surgical History  Procedure Laterality Date  . Coronary stent placement  2009  . Hernia repair    . Carotid endarterectomy    . Cataract extraction    . Chest tube insertion Right 02/18/2014  . Inguinal hernia repair    . Tonsillectomy    . Coronary angioplasty with stent placement    . Achilles tendon repair Left    Social History:  reports that he has never smoked. He has never used smokeless tobacco. He reports that he drinks alcohol. He reports that he does not use illicit drugs.  No Known Allergies  Family History  Problem Relation Age of Onset  . Heart disease Mother   . Diabetes Father     Prior to Admission medications   Medication Sig Start Date End Date Taking? Authorizing Provider  acetaminophen (TYLENOL) 500 MG tablet Take 500 mg by mouth every 6 (six) hours as needed for mild pain.   Yes Historical Provider, MD  aspirin EC 81 MG tablet Take 81 mg by mouth daily.   Yes Historical Provider, MD  atorvastatin (LIPITOR) 80 MG tablet Take 80 mg by mouth daily.   Yes Historical Provider, MD  b complex vitamins tablet Take 1 tablet by mouth daily.   Yes Historical Provider, MD  cholecalciferol (VITAMIN D) 1000 UNITS tablet Take 1,000 Units by mouth daily.   Yes Historical Provider, MD  dabigatran (  PRADAXA) 150 MG CAPS capsule Take 1 capsule (150 mg total) by mouth 2 (two) times daily. 02/24/14  Yes Drucilla Schmidt, MD  donepezil (ARICEPT) 10 MG tablet Take 1 tablet (10 mg total) by mouth daily. 04/01/14  Yes Richard A. Sater, MD  ezetimibe (ZETIA) 10 MG tablet Take 10 mg by mouth daily.   Yes Historical Provider, MD  fluticasone (FLONASE) 50 MCG/ACT nasal spray Place 2 sprays into both nostrils daily.   Yes Historical Provider, MD  gabapentin (NEURONTIN) 400 MG capsule Take 400 mg by mouth daily.   Yes Historical  Provider, MD  magnesium oxide (MAG-OX) 400 MG tablet Take 400 mg by mouth daily.   Yes Historical Provider, MD  memantine (NAMENDA XR) 28 MG CP24 24 hr capsule Take 28 mg by mouth daily.   Yes Historical Provider, MD  sotalol (BETAPACE) 80 MG tablet Take 40 mg by mouth 2 (two) times daily.   Yes Historical Provider, MD  HYDROcodone-acetaminophen (NORCO/VICODIN) 5-325 MG per tablet Take 1-2 tablets by mouth every 6 (six) hours as needed for moderate pain or severe pain. Patient not taking: Reported on 04/30/2014 03/03/14   Osa Craver, MD  Memantine HCl ER (NAMENDA XR) 21 MG CP24 Take 28 mg by mouth daily. Patient not taking: Reported on 04/30/2014 04/01/14   Richard A. Sater, MD  metroNIDAZOLE (METROGEL) 1 % gel Apply 1 application topically 2 (two) times daily as needed (rosacea). To face    Historical Provider, MD    Physical Exam: Filed Vitals:   04/30/14 0230 04/30/14 0300 04/30/14 0330 04/30/14 0500  BP: 125/53  156/54 121/37  Pulse: 61  60 61  Temp:   98.5 F (36.9 C) 98.4 F (36.9 C)  TempSrc:   Oral Oral  Resp: 14  16 16   Height:   6' (1.829 m)   Weight:  62.1 kg (136 lb 14.5 oz) 62.869 kg (138 lb 9.6 oz)   SpO2: 98%  97% 99%    General: Alert, Awake and Oriented to Time, Place and Person. Appear in mild distress Eyes: PERRL ENT: Oral Mucosa clear moist. Neck: no JVD Cardiovascular: S1 and S2 Present, aortic systolic Murmur, Peripheral Pulses Present Respiratory: Bilateral Air entry equal and Decreased, Clear to Auscultation, noCrackles, no wheezes Abdomen: Bowel Sound present, Soft and non tender Skin: no Rash Extremities: no Pedal edema, no calf tenderness Neurologic: Grossly no focal neuro deficit.  Labs on Admission:  CBC:  Recent Labs Lab 04/29/14 2332  WBC 6.4  NEUTROABS 3.8  HGB 11.6*  HCT 37.7*  MCV 84.9  PLT 207    CMP     Component Value Date/Time   NA 140 04/29/2014 2332   K 3.9 04/29/2014 2332   CL 104 04/29/2014 2332   CO2 32 04/29/2014  2332   GLUCOSE 98 04/29/2014 2332   BUN 19 04/29/2014 2332   CREATININE 0.93 04/29/2014 2332   CALCIUM 9.2 04/29/2014 2332   PROT 7.0 04/29/2014 2332   ALBUMIN 3.5 04/29/2014 2332   AST 24 04/29/2014 2332   ALT 22 04/29/2014 2332   ALKPHOS 93 04/29/2014 2332   BILITOT 0.7 04/29/2014 2332   GFRNONAA 77* 04/29/2014 2332   GFRAA 90* 04/29/2014 2332     Recent Labs Lab 04/29/14 2332  LIPASE 44    No results for input(s): CKTOTAL, CKMB, CKMBINDEX, TROPONINI in the last 168 hours. BNP (last 3 results) No results for input(s): BNP in the last 8760 hours.  ProBNP (last 3 results) No results for input(s):  PROBNP in the last 8760 hours.   Radiological Exams on Admission: Dg Chest 2 View  04/29/2014   CLINICAL DATA:  Acute onset of generalized weakness. Initial encounter.  EXAM: CHEST  2 VIEW  COMPARISON:  Chest radiograph performed 02/24/2014  FINDINGS: The lungs are well-aerated. A small right pleural effusion is again noted, improved from the prior study. There is no evidence of pneumothorax.  The heart is normal in size; the mediastinal contour is within normal limits. Previously noted displaced rib fractures are again seen, with minimal callus formation.  IMPRESSION: Small right pleural effusion is improved from the prior study. Multiple displaced right-sided rib fractures again noted, similar in appearance to the prior study, with minimal associated callus formation.   Electronically Signed   By: Garald Balding M.D.   On: 04/29/2014 23:59   Ct Head Wo Contrast  04/30/2014   CLINICAL DATA:  Syncopal episode tonight. Previous syncopal episodes in December. Dementia.  EXAM: CT HEAD WITHOUT CONTRAST  TECHNIQUE: Contiguous axial images were obtained from the base of the skull through the vertex without intravenous contrast.  COMPARISON:  03/10/2014  FINDINGS: Diffuse cerebral atrophy. Ventricular dilatation consistent with central atrophy. Low-attenuation changes in the deep white matter  consistent with small vessel ischemia. No mass effect or midline shift. No abnormal extra-axial fluid collections. Gray-white matter junctions are distinct. Basal cisterns are not effaced. No evidence of acute intracranial hemorrhage. No depressed skull fractures. Visualized paranasal sinuses and mastoid air cells are not opacified. Vascular calcifications.  IMPRESSION: No acute intracranial abnormalities. Chronic atrophy and small vessel ischemic changes. Similar appearance to prior study.   Electronically Signed   By: Lucienne Capers M.D.   On: 04/30/2014 01:15   US Soft Tissue Head/neck  04/28/2014   CLINICAL DATA:  Right thyroid mass noted on CT chest  EXAM: THYROID ULTRASOUND  TECHNIQUE: Ultrasound examination of the thyroid gland and adjacent soft tissues was performed.  COMPARISON:  None.  FINDINGS: Right thyroid lobe  Measurements: 4.9 x 2.5 x 2.4 cm. Heterogeneous tissue. 2.8 x 2.2 x 2.3 cm complex solid and cystic lesion in the lower pole.  Left thyroid lobe  Measurements: 4.5 x 1.3 x 1.6 cm. Heterogeneous tissue. Very small left-sided nodules measuring 6 mm or less.  Isthmus  Thickness: 3 mm.  No nodules visualized.  Lymphadenopathy  None visualized.  IMPRESSION: Bilateral nodules. Dominant complex right lower pole nodule measures 2.8 cm. Findings meet consensus criteria for biopsy. Ultrasound-guided fine needle aspiration should be considered, as per the consensus statement: Management of Thyroid Nodules Detected at [place]: Society of Radiologists in [place]. Radiology 2005; [id number].   Electronically Signed   By: Marybelle Killings M.D.   On: 04/28/2014 15:38    EKG: Independently reviewed. normal sinus rhythm, nonspecific ST and T waves changes.  Assessment/Plan Principal Problem:   Orthostatic syncope Active Problems:   Paroxysmal atrial fibrillation   Dementia   Thyroid mass   Severe calcific aortic valve stenosis   1. Orthostatic syncope The patient is  presenting with complaints of dizziness followed by passing out episode and a fall. EKG is unremarkable. Patient has positive orthostasis here in the ER as well. He has chronic orthostatic hypotension with possible differential included age-related, aortic stenosis, medication induced, arrhythmia induced. Patient will be admitted in the hospital for observation on telemetry. Cardiology consultation may be sought in the morning for further discussion about his aortic stenosis. Due to his recurrent fall patient is at high risk for  bleeding than ischemic stroke therefore it'll be helpful to get information from cardiology about continuation of pradexa as well.  2. Paroxysmal A. fib. Continue sotalol. Currently sinus rhythm.  3. Dementia. Does not have any behavioral symptoms but has chronic speech issues. Continue Aricept and continue Namenda.  4. Thyroid mass. Patient recommended outpatient thyroid biopsy.  Advance goals of care discussion: DNR/DNI as per my discussion with patient and wife.  DVT Prophylaxis: on chronic anticoagulation Nutrition: Cardiac diet  Family Communication: Wife and son was present at bedside, opportunity was given to ask question and all questions were answered satisfactorily at the time of interview. Disposition: Admitted to observation in telemetry unit.  Author: Berle Mull, MD [facility] Pager: [phone] 04/30/2014, 7:02 AM    If 7PM-7AM, please contact night-coverage [email or web address] Password [email or web address]

## 2014-04-30 NOTE — Progress Notes (Signed)
VASCULAR LAB PRELIMINARY  PRELIMINARY  PRELIMINARY  PRELIMINARY  Carotid Dopplers completed.    Preliminary report:  40-59% right ICA stenosis, highest end of range.  1-39% left ICA stenosis.  Bilateral vertebral artery flow is antegrade.   Airica Schwartzkopf, RVT 04/30/2014, 4:14 PM

## 2014-04-30 NOTE — Care Management Note (Signed)
    Page 1 of 1   05/01/2014     3:15:40 PM CARE MANAGEMENT NOTE 05/01/2014  Patient:  Eddie [name], Eddie [name]   Account Number:  [id number]  Date Initiated:  04/30/2014  Documentation initiated by:  Chayce Rullo  Subjective/Objective Assessment:   Pt adm on 04/29/14 with syncope.  PTA, pt resides at home with wife.     Action/Plan:   Will follow for dc needs as pt progresses.   Anticipated DC Date:  05/01/2014   Anticipated DC Plan:  [place]  CM consult      Choice offered to / List presented to:     DME arranged  [place]      DME agency  [place].        Status of service:  Completed, signed off Medicare Important Message given?  NA - LOS <3 / Initial given by admissions (If response is "NO", the following Medicare IM given date fields will be blank) Date Medicare IM given:   Medicare IM given by:   Date Additional Medicare IM given:   Additional Medicare IM given by:    Discharge Disposition:  HOME/SELF CARE  Per UR Regulation:  Reviewed for med. necessity/level of care/duration of stay  If discussed at [place] of Stay Meetings, dates discussed:    Comments:  05/01/14 Ellan Lambert, RN, BSN [phone] Pt with hx dementia; lives with wife and has 24h caregiver at home.  3 in 1 BSC ordered for home, per request. Referral to [facility] for DME needs.

## 2014-04-30 NOTE — Consult Note (Addendum)
Cardiology Consultation  Eddie [name]    [id number] [birth year]  Reason for Consult:  Syncope/aortic stenosis  Requesting Physician: Berle Mull, MD  Primary Cardiologist: Josue Hector, MD  HPI: Eddie [name] is an 81 year white male who has a history of coronary artery disease and underwent prior PCI with 3 stent implantation in [place], [place] in 2009.  The patient also is status post left carotid endarterectomy.  He did recently move to [place] from [place].  There is a history of paroxysmal atrial fibrillation and aortic stenosis.  In December 2015 he experienced a syncopal spell while on Pradaxa and had fractured ribs and developed a hemopneumothorax requiring chest tube insertion.  The patient has been documented to have significant orthostatic hypotension and has a five-year history of dementia.  He was admitted last evening after having a significant fall at home.  Reportedly, when he was evaluated at home.  His blood pressure had dropped to 66 systolically.  He was admitted by the internal medicine service.  He is being followed for his orthostatic hypotension and cardiology consultation was recommended for further evaluation of his aortic valve stenosis. Patient denies any episodes of recent chest pain.  He is unaware of PND or orthopnea or congestive heart failure symptoms.  He does have significant orthostatic hypotension.  He is unaware of recurrent atrial fibrillation, although he has a history of paroxysmal atrial fibrillation for which she has been treated with sotalol.  He has been back on Pradaxa 150 mg twice a day.  He has a history of hyperlipidemia for which he has been on atorvastatin/Zetia.  Past Medical History  Diagnosis Date  . A-fib   . Dementia   . HTN (hypertension)   . CAD (coronary artery disease)     s/p PCI x 3 (09/2007)  . Carotid artery stenosis     left  . Sciatica   . Pneumothorax 79/04/2013  . Thrombocytopenia   . Thyroid mass   .  Memory loss   . Syncope and collapse   . Vision abnormalities    Past Surgical History  Procedure Laterality Date  . Coronary stent placement  2009  . Hernia repair    . Carotid endarterectomy    . Cataract extraction    . Chest tube insertion Right 02/18/2014  . Inguinal hernia repair    . Tonsillectomy    . Coronary angioplasty with stent placement    . Achilles tendon repair Left     FAMHx: Family History  Problem Relation Age of Onset  . Heart disease Mother   . Diabetes Father     SOCHx:  reports that he has never smoked. He has never used smokeless tobacco. He reports that he drinks alcohol. He reports that he does not use illicit drugs.  ALLERGIES: No Known Allergies   HOME MEDICATIONS: Prescriptions prior to admission  Medication Sig Dispense Refill Last Dose  . acetaminophen (TYLENOL) 500 MG tablet Take 500 mg by mouth every 6 (six) hours as needed for mild pain.   04/29/2014 at Unknown time  . aspirin EC 81 MG tablet Take 81 mg by mouth daily.   04/29/2014 at Unknown time  . atorvastatin (LIPITOR) 80 MG tablet Take 80 mg by mouth daily.   04/29/2014 at Unknown time  . b complex vitamins tablet Take 1 tablet by mouth daily.   04/29/2014 at Unknown time  . cholecalciferol (VITAMIN D) 1000 UNITS tablet Take 1,000 Units by mouth daily.   04/29/2014 at  Unknown time  . dabigatran (PRADAXA) 150 MG CAPS capsule Take 1 capsule (150 mg total) by mouth 2 (two) times daily. 60 capsule  04/29/2014 at Unknown time  . donepezil (ARICEPT) 10 MG tablet Take 1 tablet (10 mg total) by mouth daily. 30 tablet 11 04/29/2014 at Unknown time  . ezetimibe (ZETIA) 10 MG tablet Take 10 mg by mouth daily.   04/29/2014 at Unknown time  . fluticasone (FLONASE) 50 MCG/ACT nasal spray Place 2 sprays into both nostrils daily.   04/29/2014 at Unknown time  . gabapentin (NEURONTIN) 400 MG capsule Take 400 mg by mouth daily.   04/29/2014 at Unknown time  . magnesium oxide (MAG-OX) 400 MG tablet Take 400 mg  by mouth daily.   04/29/2014 at Unknown time  . memantine (NAMENDA XR) 28 MG CP24 24 hr capsule Take 28 mg by mouth daily.   04/29/2014 at Unknown time  . sotalol (BETAPACE) 80 MG tablet Take 40 mg by mouth 2 (two) times daily.   04/29/2014 at 2130  . HYDROcodone-acetaminophen (NORCO/VICODIN) 5-325 MG per tablet Take 1-2 tablets by mouth every 6 (six) hours as needed for moderate pain or severe pain. (Patient not taking: Reported on 04/30/2014) 56 tablet 0 Not Taking at Unknown time  . Memantine HCl ER (NAMENDA XR) 21 MG CP24 Take 28 mg by mouth daily. (Patient not taking: Reported on 04/30/2014) 30 capsule 11 Not Taking at Unknown time  . metroNIDAZOLE (METROGEL) 1 % gel Apply 1 application topically 2 (two) times daily as needed (rosacea). To face   unknown    HOSPITAL MEDICATIONS: . aspirin EC  81 mg Oral Daily  . atorvastatin  80 mg Oral Daily  . dabigatran  150 mg Oral Q12H  . donepezil  10 mg Oral Daily  . ezetimibe  10 mg Oral Daily  . feeding supplement (ENSURE COMPLETE)  237 mL Oral BID BM  . fluticasone  2 spray Each Nare Daily  . gabapentin  400 mg Oral Daily  . memantine  28 mg Oral Daily  . sodium chloride  3 mL Intravenous Q12H  . sotalol  40 mg Oral BID        ROS General: Negative; No fevers, chills, or night sweats;  HEENT: Negative; No changes in vision or hearing, sinus congestion, difficulty swallowing Pulmonary: Negative; No cough, wheezing, shortness of breath, hemoptysis Cardiovascular: See history of present illness. 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 VITALS: Blood pressure 110/48, pulse 72, temperature 99 F (37.2 C), temperature source Oral, resp. rate 16, height 6' (1.829 m), weight 138 lb 9.6 oz (62.869 kg), SpO2 98 %.  PHYSICAL EXAM: General appearance: alert, cooperative and no distress Neck: no JVD, supple, symmetrical, trachea midline, thyroid not enlarged, symmetric, no tenderness/mass/nodules and Bilateral transmitted murmur Lungs: clear to auscultation bilaterally Heart: regular rate and rhythm and 2/6 systolic murmur in the aortic region.  No AR appreciated.  No rubs thrills or heaves. 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  Possible soft femoral bruits Skin: Skin color, texture, turgor normal. No rashes or lesions Neurologic: Grossly normal  ECG (independently read by me): Reviewed from 02/23/2014: Atrial fibrillation with rapid ventricular response.  ST-T changes. No ECG presently in Epic for this current admission.  LABS: Results for orders placed or performed during the hospital encounter of 04/29/14 (from the past 48 hour(s))  CBC with Differential/Platelet     Status: Abnormal   Collection Time: 04/29/14 11:32 PM  Result Value Ref Range   WBC 6.4 4.0 - 10.5 K/uL   RBC 4.44 4.22 - 5.81 MIL/uL   Hemoglobin 11.6 (L) 13.0 - 17.0 g/dL   HCT 37.7 (L) 39.0 - 52.0 %   MCV 84.9 78.0 - 100.0 fL   MCH 26.1 26.0 - 34.0 pg   MCHC 30.8 30.0 - 36.0 g/dL   RDW 16.2 (H) 11.5 - 15.5 %   Platelets 207 150 - 400 K/uL   Neutrophils Relative % 59 43 - 77 %   Neutro Abs 3.8 1.7 - 7.7 K/uL    Lymphocytes Relative 24 12 - 46 %   Lymphs Abs 1.5 0.7 - 4.0 K/uL   Monocytes Relative 13 (H) 3 - 12 %   Monocytes Absolute 0.8 0.1 - 1.0 K/uL   Eosinophils Relative 4 0 - 5 %   Eosinophils Absolute 0.2 0.0 - 0.7 K/uL   Basophils Relative 0 0 - 1 %   Basophils Absolute 0.0 0.0 - 0.1 K/uL  Comprehensive metabolic panel     Status: Abnormal   Collection Time: 04/29/14 11:32 PM  Result Value Ref Range   Sodium 140 135 - 145 mmol/L   Potassium 3.9 3.5 - 5.1 mmol/L   Chloride 104 96 - 112 mmol/L   CO2 32 19 - 32 mmol/L   Glucose, Bld 98 70 - 99 mg/dL   BUN 19 6 - 23 mg/dL   Creatinine, Ser 0.93 0.50 - 1.35 mg/dL   Calcium 9.2 8.4 - 10.5 mg/dL   Total Protein 7.0 6.0 - 8.3 g/dL   Albumin 3.5 3.5 - 5.2 g/dL   AST 24 0 - 37 U/L   ALT 22 0 - 53 U/L   Alkaline Phosphatase 93 39 - 117 U/L   Total Bilirubin 0.7 0.3 - 1.2 mg/dL   GFR calc non Af Amer 77 (L) >90 mL/min   GFR calc Af Amer 90 (L) >90 mL/min    Comment: (NOTE) The eGFR has been calculated using the CKD EPI equation. This calculation has not been validated in all clinical situations. eGFR's persistently <90 mL/min signify possible Chronic Kidney Disease.    Anion gap 4 (L) 5 - 15  Lipase, blood     Status: None   Collection Time: 04/29/14 11:32 PM  Result Value Ref Range   Lipase 44 11 - 59 U/L    Comment: REPEATED TO VERIFY  Ethanol     Status: None   Collection Time: 04/29/14 11:32 PM  Result Value Ref Range   Alcohol, Ethyl (B) <5 0 - 9 mg/dL    Comment:        LOWEST DETECTABLE LIMIT FOR SERUM ALCOHOL IS 11 mg/dL FOR MEDICAL PURPOSES ONLY   Protime-INR     Status: Abnormal   Collection Time: 04/29/14 11:32 PM  Result Value Ref Range   Prothrombin Time 19.3 (H) 11.6 - 15.2  seconds   INR 1.61 (H) 0.00 - 1.49  I-stat troponin, ED     Status: None   Collection Time: 04/29/14 11:44 PM  Result Value Ref Range   Troponin i, poc 0.00 0.00 - 0.08 ng/mL   Comment 3            Comment: Due to the release kinetics of  cTnI, a negative result within the first hours of the onset of symptoms does not rule out myocardial infarction with certainty. If myocardial infarction is still suspected, repeat the test at appropriate intervals.   I-Stat CG4 Lactic Acid, ED     Status: None   Collection Time: 04/29/14 11:46 PM  Result Value Ref Range   Lactic Acid, Venous 0.78 0.5 - 2.0 mmol/L  Urinalysis, Routine w reflex microscopic     Status: None   Collection Time: 04/30/14  1:30 AM  Result Value Ref Range   Color, Urine YELLOW YELLOW   APPearance CLEAR CLEAR   Specific Gravity, Urine 1.014 1.005 - 1.030   pH 5.5 5.0 - 8.0   Glucose, UA NEGATIVE NEGATIVE mg/dL   Hgb urine dipstick NEGATIVE NEGATIVE   Bilirubin Urine NEGATIVE NEGATIVE   Ketones, ur NEGATIVE NEGATIVE mg/dL   Protein, ur NEGATIVE NEGATIVE mg/dL   Urobilinogen, UA 0.2 0.0 - 1.0 mg/dL   Nitrite NEGATIVE NEGATIVE   Leukocytes, UA NEGATIVE NEGATIVE    Comment: MICROSCOPIC NOT DONE ON URINES WITH NEGATIVE PROTEIN, BLOOD, LEUKOCYTES, NITRITE, OR GLUCOSE <1000 mg/dL.  Glucose, capillary     Status: Abnormal   Collection Time: 04/30/14  6:55 AM  Result Value Ref Range   Glucose-Capillary 155 (H) 70 - 99 mg/dL  CBC with Differential/Platelet     Status: Abnormal   Collection Time: 04/30/14  9:20 AM  Result Value Ref Range   WBC 5.4 4.0 - 10.5 K/uL   RBC 4.00 (L) 4.22 - 5.81 MIL/uL   Hemoglobin 10.5 (L) 13.0 - 17.0 g/dL   HCT 34.7 (L) 39.0 - 52.0 %   MCV 86.8 78.0 - 100.0 fL   MCH 26.3 26.0 - 34.0 pg   MCHC 30.3 30.0 - 36.0 g/dL   RDW 16.4 (H) 11.5 - 15.5 %   Platelets 172 150 - 400 K/uL   Neutrophils Relative % 70 43 - 77 %   Neutro Abs 3.8 1.7 - 7.7 K/uL   Lymphocytes Relative 18 12 - 46 %   Lymphs Abs 1.0 0.7 - 4.0 K/uL   Monocytes Relative 8 3 - 12 %   Monocytes Absolute 0.4 0.1 - 1.0 K/uL   Eosinophils Relative 3 0 - 5 %   Eosinophils Absolute 0.2 0.0 - 0.7 K/uL   Basophils Relative 1 0 - 1 %   Basophils Absolute 0.0 0.0 - 0.1 K/uL   Comprehensive metabolic panel     Status: Abnormal   Collection Time: 04/30/14  9:20 AM  Result Value Ref Range   Sodium 143 135 - 145 mmol/L   Potassium 4.2 3.5 - 5.1 mmol/L   Chloride 108 96 - 112 mmol/L   CO2 27 19 - 32 mmol/L   Glucose, Bld 139 (H) 70 - 99 mg/dL   BUN 13 6 - 23 mg/dL   Creatinine, Ser 0.83 0.50 - 1.35 mg/dL   Calcium 8.6 8.4 - 10.5 mg/dL   Total Protein 5.7 (L) 6.0 - 8.3 g/dL   Albumin 2.9 (L) 3.5 - 5.2 g/dL   AST 21 0 - 37 U/L   ALT 17  0 - 53 U/L   Alkaline Phosphatase 76 39 - 117 U/L   Total Bilirubin 0.4 0.3 - 1.2 mg/dL   GFR calc non Af Amer 81 (L) >90 mL/min   GFR calc Af Amer >90 >90 mL/min    Comment: (NOTE) The eGFR has been calculated using the CKD EPI equation. This calculation has not been validated in all clinical situations. eGFR's persistently <90 mL/min signify possible Chronic Kidney Disease.    Anion gap 8 5 - 15    IMAGING: Dg Chest 2 View  04/29/2014   CLINICAL DATA:  Acute onset of generalized weakness. Initial encounter.  EXAM: CHEST  2 VIEW  COMPARISON:  Chest radiograph performed 02/24/2014  FINDINGS: The lungs are well-aerated. A small right pleural effusion is again noted, improved from the prior study. There is no evidence of pneumothorax.  The heart is normal in size; the mediastinal contour is within normal limits. Previously noted displaced rib fractures are again seen, with minimal callus formation.  IMPRESSION: Small right pleural effusion is improved from the prior study. Multiple displaced right-sided rib fractures again noted, similar in appearance to the prior study, with minimal associated callus formation.   Electronically Signed   By: Garald Balding M.D.   On: 04/29/2014 23:59   Ct Head Wo Contrast  04/30/2014   CLINICAL DATA:  Syncopal episode tonight. Previous syncopal episodes in December. Dementia.  EXAM: CT HEAD WITHOUT CONTRAST  TECHNIQUE: Contiguous axial images were obtained from the base of the skull through the  vertex without intravenous contrast.  COMPARISON:  03/10/2014  FINDINGS: Diffuse cerebral atrophy. Ventricular dilatation consistent with central atrophy. Low-attenuation changes in the deep white matter consistent with small vessel ischemia. No mass effect or midline shift. No abnormal extra-axial fluid collections. Gray-white matter junctions are distinct. Basal cisterns are not effaced. No evidence of acute intracranial hemorrhage. No depressed skull fractures. Visualized paranasal sinuses and mastoid air cells are not opacified. Vascular calcifications.  IMPRESSION: No acute intracranial abnormalities. Chronic atrophy and small vessel ischemic changes. Similar appearance to prior study.   Electronically Signed   By: Lucienne Capers M.D.   On: 04/30/2014 01:15   US Soft Tissue Head/neck  04/28/2014   CLINICAL DATA:  Right thyroid mass noted on CT chest  EXAM: THYROID ULTRASOUND  TECHNIQUE: Ultrasound examination of the thyroid gland and adjacent soft tissues was performed.  COMPARISON:  None.  FINDINGS: Right thyroid lobe  Measurements: 4.9 x 2.5 x 2.4 cm. Heterogeneous tissue. 2.8 x 2.2 x 2.3 cm complex solid and cystic lesion in the lower pole.  Left thyroid lobe  Measurements: 4.5 x 1.3 x 1.6 cm. Heterogeneous tissue. Very small left-sided nodules measuring 6 mm or less.  Isthmus  Thickness: 3 mm.  No nodules visualized.  Lymphadenopathy  None visualized.  IMPRESSION: Bilateral nodules. Dominant complex right lower pole nodule measures 2.8 cm. Findings meet consensus criteria for biopsy. Ultrasound-guided fine needle aspiration should be considered, as per the consensus statement: Management of Thyroid Nodules Detected at [place]: Society of Radiologists in [place]. Radiology 2005; [id number].   Electronically Signed   By: Marybelle Killings M.D.   On: 04/28/2014 15:38   2D Transthoracic Echocardiography  Patient:  Daily, Crate MR #:    [id number] Study Date:  02/22/2014 Gender:   M Age:    82 Height:   182.9 cm Weight:   70.8 kg BSA:    1.89 m^2 ------------------------------------------------------------------- LV EF: 60% -  65%  ------------------------------------------------------------------- Indications:  CHF - 428.0.  ------------------------------------------------------------------- History:  PMH:  Syncope. Atrial fibrillation. Risk factors: Dyslipidemia.  ------------------------------------------------------------------- Study Conclusions  - Left ventricle: The cavity size was normal. Wall thickness was increased in a pattern of mild LVH. Systolic function was normal. The estimated ejection fraction was in the range of 60% to 65%. Doppler parameters are consistent with abnormal left ventricular relaxation (grade 1 diastolic dysfunction). - Aortic valve: AV is thickened, Calcified with restricted motion Peak and mean gradients through the valve are 70 and 44 mm Hg respectively consistent with severe AS. There was mild regurgitation. - Mitral valve: There was mild regurgitation. - Left atrium: The atrium was mildly dilated. - Pulmonary arteries: PA peak pressure: 41 mm Hg (S).  IMPRESSION/RECOMMENDATION:  1.  Recurrent episodes of presyncope/syncope: Patient sustained an episode of syncope 2 months ago and now presents with significant presyncope and fall yesterday with associated significant drop in blood pressure on presentation.  He has documented significant orthostatic hypotension, as well as paroxysmal atrial fibrillation with rapid ventricular response for which she has been on very low dose sotalol, which essentially is only beta blocker therapy in addition to anticoagulation.  His echo Doppler study reveals severe aortic stenosis with a peak instantaneous gradient of 70 mm and mean gradient of 44 mm, compatible with severe aortic stenosis.  Presently he is in sinus rhythm.  Suspect  his presyncope/syncope is related predominantly to his aortic stenosis as well as significant orthostasis.  2.  Severe aortic valve stenosis:  Patient has a history of significant coronary artery disease and is status post 3 stent implantation in 2009.  His echo reveals severe aortic stenosis with normal systolic function.  Tonia Ghent long discussion with the patient and his wife concerning the history of aortic stenosis and potential symptoms of chest pain, presyncope/syncope, exertional dyspnea and CHF.  Discussed further evaluation with consideration for R/L cardiac catheterization.  I also discussed recent advancements with aortic valve replacement including TAVR.  The patient states that he has been relatively active until recently.  3.  Coronary artery disease: The patient is status post PCI 3 in 2009.  He denies recent chest pain.  I am not certain as to when his last ischemic evaluation had taken place.  4.  Orthostatic hypotension: Undoubtedly this has been contributory to his syncope/presyncope.  He may be a candidate for Midodrine and needs support stockings at least 20-30 mm.  5.  Dementia: The patient has been evaluated by neurology and has been on Aricept and Namenda.  The severity of his dementia will undoubtedly be a factor in the aggressiveness of whether or not to pursue potential surgical/TAVR evaluation of his severe aortic stenosis.  6.  Paroxysmal atrial fibrillation: The patient is maintaining sinus rhythm.  His current dose of sotalol 40 mg twice a day is essentially only beta blocker therapy without significant arrhythmic benefit.  With his significant orthostatic hypotension and fall risk I do not believe he is a candidate for long-term anticoagulation.  7.  Hyperlipidemia: currently being treated with atorvastatin 80 mg and Zetia 10 mg.  Target LDL is less than 70.  8.  Carotid disease: The patient is status post left carotid endarterectomy.  Consider evaluation with carotid duplex  imaging to assess for progressive carotid disease and vertebral blood flow to make certain there is no evidence for subclavian steal.  9. R thyroid mass: per IM management.    Attending:  Troy Sine, MD, [facility] 04/30/2014 11:18 AM

## 2014-04-30 NOTE — Progress Notes (Signed)
UR completed 

## 2014-04-30 NOTE — Progress Notes (Signed)
[facility]                                                                              Patient Demographics  Eddie [name], is a 81 y.o. male, DOB - [birth year], WUX:[id number]  Admit date - 04/29/2014   Admitting Physician Berle Mull, MD  Outpatient Primary MD for the patient is Mathews Argyle, MD  LOS -    Chief Complaint  Patient presents with  . Fatigue      HPI by Dr. Berle Mull on 04/30/2014 Eddie [name] is a 81 y.o. male with Past medical history of A. fib, dementia, hypertension, coronary artery disease, severe aortic stenosis, thyroid mass.  The patient is presenting with fall. The history was obtained from family members the patient was not communicating significantly. Patient has dementia and was admitted in December after a fall causing hemopneumothorax requiring chest tube. Patient's Pradaxa was resumed on the time of discharge. Patient was referred to cardiology as an outpatient. Patient was also evaluated by neurology who started the patient on Aricept. Patient continued to have orthostatic hypotension based on documentation from neurology notes. Today the patient was walking with a 24-hour caregiver and passed out. Patient denies to me that he has any dizziness but mentions he has worsening of his chronic speech difficulty before the fall on the time. His blood pressure was 60s over 40s and therefore he was brought here. At the time of my evaluation the patient denies any complaint of headache, dizziness, lightheadedness, focal deficit, chest pain, palpitation, nausea, vomiting, diarrhea, burning urination, cough. Patient had cardiac angiography on [place] with PCI 3 and then he has been following up with cardiology at [place]. And recently moved here in [place].  The patient is coming from home. And at his baseline dependent for most of his ADL.  Assessment & Plan  Syncope with recent fall -Likely secondary to orthostatic hypotension,  severe aortic stenosis -Appears to be recurrent, patient was supposed to follow-up with cardiology later this month -Echocardiogram 02/22/2014 shows an EF 25-36%, grade 1 diastolic dysfunction, Peak and mean gradients of the aortic valve 70 and 44 mmHg- Severe AS -Patient has had carotid disease in the past, will obtain carotid doppler  Severe aortic stenosis -Cardiology consulted and appreciated -Echocardiogram as noted above  Paroxysmal atrial fibrillation -Currently rate and rhythm control  -on Pradaxa as well as sotalol -Patient is likely not an anticoagulation candidate given his fall risk  History of coronary artery disease -Patient's currently chest pain-free, status post stent implantation in 2009 -Continue aspirin, statin, beta blocker  Dementia -Continue Namenda and Aricept  Thyroid mass -Followup as an outpatient- thyroid biopsy   Hyperlipidemia -continue statin  History of carotid disease -Status post left carotid endarterectomy -Carotid doppler pending  Code Status: DNR  Family Communication: Wife at bedside  Disposition Plan: Admitted.  Pending further evaluation and cardiology recommendations.  Time Spent in minutes   30 minutes  Procedures  None  Consults   Cardiology  DVT Prophylaxis  Pradaxa  Lab Results  Component Value Date   PLT 172 04/30/2014    Medications  Scheduled Meds: . aspirin EC  81 mg Oral Daily  .  atorvastatin  80 mg Oral Daily  . dabigatran  150 mg Oral Q12H  . donepezil  10 mg Oral Daily  . ezetimibe  10 mg Oral Daily  . feeding supplement (ENSURE COMPLETE)  237 mL Oral BID BM  . fluticasone  2 spray Each Nare Daily  . gabapentin  400 mg Oral Daily  . memantine  28 mg Oral Daily  . sodium chloride  3 mL Intravenous Q12H  . sotalol  40 mg Oral BID   Continuous Infusions:  PRN Meds:.acetaminophen **OR** acetaminophen, ondansetron **OR** ondansetron (ZOFRAN) IV  Antibiotics    Anti-infectives    None       Subjective:   Eddie [name] seen and examined today.  Patient has no complaints of chest pain or shortness of breath, dizziness, abdominal pain.    Objective:   Filed Vitals:   04/30/14 0300 04/30/14 0330 04/30/14 0500 04/30/14 0937  BP:  156/54 121/37 110/48  Pulse:  60 61 72  Temp:  98.5 F (36.9 C) 98.4 F (36.9 C) 99 F (37.2 C)  TempSrc:  Oral Oral Oral  Resp:  16 16 16   Height:  6' (1.829 m)    Weight: 62.1 kg (136 lb 14.5 oz) 62.869 kg (138 lb 9.6 oz)    SpO2:  97% 99% 98%    Wt Readings from Last 3 Encounters:  04/30/14 62.869 kg (138 lb 9.6 oz)  04/01/14 62.143 kg (137 lb)  03/03/14 68.448 kg (150 lb 14.4 oz)     Intake/Output Summary (Last 24 hours) at 04/30/14 1234 Last data filed at 04/30/14 1157  Gross per 24 hour  Intake    723 ml  Output    950 ml  Net   -227 ml    Exam  General: Well developed, well nourished, NAD, appears stated age  20: NCAT,  mucous membranes moist.   Neck: Supple, no JVD, no masses  Cardiovascular: S1 S2 auscultated, RRR, 2/6 SEM  Respiratory: Clear to auscultation bilaterally with equal chest rise  Abdomen: Soft, nontender, nondistended, + bowel sounds  Extremities: warm dry without cyanosis clubbing or edema  Neuro: AAOx2, nonfocal  Skin: Without rashes exudates or nodules  Psych: Flat affect  Data Review   Micro Results No results found for this or any previous visit (from the past 240 hour(s)).  Radiology Reports Dg Chest 2 View  04/29/2014   CLINICAL DATA:  Acute onset of generalized weakness. Initial encounter.  EXAM: CHEST  2 VIEW  COMPARISON:  Chest radiograph performed 02/24/2014  FINDINGS: The lungs are well-aerated. A small right pleural effusion is again noted, improved from the prior study. There is no evidence of pneumothorax.  The heart is normal in size; the mediastinal contour is within normal limits. Previously noted displaced rib fractures are again seen, with minimal callus formation.   IMPRESSION: Small right pleural effusion is improved from the prior study. Multiple displaced right-sided rib fractures again noted, similar in appearance to the prior study, with minimal associated callus formation.   Electronically Signed   By: Garald Balding M.D.   On: 04/29/2014 23:59   Ct Head Wo Contrast  04/30/2014   CLINICAL DATA:  Syncopal episode tonight. Previous syncopal episodes in December. Dementia.  EXAM: CT HEAD WITHOUT CONTRAST  TECHNIQUE: Contiguous axial images were obtained from the base of the skull through the vertex without intravenous contrast.  COMPARISON:  03/10/2014  FINDINGS: Diffuse cerebral atrophy. Ventricular dilatation consistent with central atrophy. Low-attenuation changes in the deep white  matter consistent with small vessel ischemia. No mass effect or midline shift. No abnormal extra-axial fluid collections. Gray-white matter junctions are distinct. Basal cisterns are not effaced. No evidence of acute intracranial hemorrhage. No depressed skull fractures. Visualized paranasal sinuses and mastoid air cells are not opacified. Vascular calcifications.  IMPRESSION: No acute intracranial abnormalities. Chronic atrophy and small vessel ischemic changes. Similar appearance to prior study.   Electronically Signed   By: Lucienne Capers M.D.   On: 04/30/2014 01:15   US Soft Tissue Head/neck  04/28/2014   CLINICAL DATA:  Right thyroid mass noted on CT chest  EXAM: THYROID ULTRASOUND  TECHNIQUE: Ultrasound examination of the thyroid gland and adjacent soft tissues was performed.  COMPARISON:  None.  FINDINGS: Right thyroid lobe  Measurements: 4.9 x 2.5 x 2.4 cm. Heterogeneous tissue. 2.8 x 2.2 x 2.3 cm complex solid and cystic lesion in the lower pole.  Left thyroid lobe  Measurements: 4.5 x 1.3 x 1.6 cm. Heterogeneous tissue. Very small left-sided nodules measuring 6 mm or less.  Isthmus  Thickness: 3 mm.  No nodules visualized.  Lymphadenopathy  None visualized.  IMPRESSION:  Bilateral nodules. Dominant complex right lower pole nodule measures 2.8 cm. Findings meet consensus criteria for biopsy. Ultrasound-guided fine needle aspiration should be considered, as per the consensus statement: Management of Thyroid Nodules Detected at [place]: Society of Radiologists in [place]. Radiology 2005; [id number].   Electronically Signed   By: Marybelle Killings M.D.   On: 04/28/2014 15:38    CBC  Recent Labs Lab 04/29/14 2332 04/30/14 0920  WBC 6.4 5.4  HGB 11.6* 10.5*  HCT 37.7* 34.7*  PLT 207 172  MCV 84.9 86.8  MCH 26.1 26.3  MCHC 30.8 30.3  RDW 16.2* 16.4*  LYMPHSABS 1.5 1.0  MONOABS 0.8 0.4  EOSABS 0.2 0.2  BASOSABS 0.0 0.0    Chemistries   Recent Labs Lab 04/29/14 2332 04/30/14 0920  NA 140 143  K 3.9 4.2  CL 104 108  CO2 32 27  GLUCOSE 98 139*  BUN 19 13  CREATININE 0.93 0.83  CALCIUM 9.2 8.6  AST 24 21  ALT 22 17  ALKPHOS 93 76  BILITOT 0.7 0.4   ------------------------------------------------------------------------------------------------------------------ estimated creatinine clearance is 63.2 mL/min (by C-G formula based on Cr of 0.83). ------------------------------------------------------------------------------------------------------------------ No results for input(s): HGBA1C in the last 72 hours. ------------------------------------------------------------------------------------------------------------------ No results for input(s): CHOL, HDL, LDLCALC, TRIG, CHOLHDL, LDLDIRECT in the last 72 hours. ------------------------------------------------------------------------------------------------------------------ No results for input(s): TSH, T4TOTAL, T3FREE, THYROIDAB in the last 72 hours.  Invalid input(s): FREET3 ------------------------------------------------------------------------------------------------------------------ No results for input(s): VITAMINB12, FOLATE, FERRITIN, TIBC, IRON, RETICCTPCT  in the last 72 hours.  Coagulation profile  Recent Labs Lab 04/29/14 2332  INR 1.61*    No results for input(s): DDIMER in the last 72 hours.  Cardiac Enzymes No results for input(s): CKMB, TROPONINI, MYOGLOBIN in the last 168 hours.  Invalid input(s): CK ------------------------------------------------------------------------------------------------------------------ Invalid input(s): POCBNP    Lavonne Cass D.O. on 04/30/2014 at 12:34 PM  Between 7am to 7pm - Pager - [phone]  After 7pm go to [email or web address] - password [email or web address]  And look for the night coverage person covering for me after hours  [facility] Office  [phone]

## 2014-04-30 NOTE — Progress Notes (Signed)
ANTICOAGULATION CONSULT NOTE - Initial Consult  Pharmacy Consult for Pradaxa  Indication: atrial fibrillation  No Known Allergies  Patient Measurements: Weight: 136 lb 14.5 oz (62.1 kg) (From 04/01/14)  Vital Signs: Temp: 97.5 F (36.4 C) (02/10 2339) Temp Source: Oral (02/10 2339) BP: 125/53 mmHg (02/11 0230) Pulse Rate: 61 (02/11 0230)  Labs:  Recent Labs  04/29/14 2332  HGB 11.6*  HCT 37.7*  PLT 207  LABPROT 19.3*  INR 1.61*  CREATININE 0.93    Estimated Creatinine Clearance: 55.6 mL/min (by C-G formula based on Cr of 0.93).   Medical History: Past Medical History  Diagnosis Date  . A-fib   . Dementia   . HTN (hypertension)   . CAD (coronary artery disease)     s/p PCI x 3 (09/2007)  . Carotid artery stenosis     left  . Sciatica   . Pneumothorax 02/18/2014  . Thrombocytopenia   . Thyroid mass   . Memory loss   . Syncope and collapse   . Vision abnormalities     Medications:   (Not in a hospital admission)  Assessment: 51 YOM brought to the ED with CC of fatigue. BP was low on arrival. CT head is negative. CXR negative. Pharmacy consulted to resume home dabigatran for Afib. Last dose of dabigatran was on 04/29/2014. SCr is at BL of 0.93. CrCl > 50 mL/min. H/H mildly low, Plt wnl.   Goal of Therapy:  Stroke prevention  Monitor platelets by anticoagulation protocol: Yes   Plan:  Resume dabigatran 150 mg by mouth twice daily  Monitor for s/s of bleeding Pharmacy will sign off since renal fx remains stable and no dose change anticipated  Albertina Parr, PharmD., BCPS Clinical Pharmacist Pager [phone]

## 2014-04-30 NOTE — ED Notes (Signed)
Dr. Patel at bedside 

## 2014-05-01 ENCOUNTER — Encounter (HOSPITAL_COMMUNITY): Payer: Self-pay | Admitting: *Deleted

## 2014-05-01 DIAGNOSIS — E785 Hyperlipidemia, unspecified: Secondary | ICD-10-CM

## 2014-05-01 DIAGNOSIS — R55 Syncope and collapse: Secondary | ICD-10-CM | POA: Diagnosis not present

## 2014-05-01 DIAGNOSIS — I951 Orthostatic hypotension: Secondary | ICD-10-CM | POA: Diagnosis not present

## 2014-05-01 DIAGNOSIS — I350 Nonrheumatic aortic (valve) stenosis: Secondary | ICD-10-CM | POA: Diagnosis not present

## 2014-05-01 DIAGNOSIS — E079 Disorder of thyroid, unspecified: Secondary | ICD-10-CM | POA: Diagnosis not present

## 2014-05-01 DIAGNOSIS — F0390 Unspecified dementia without behavioral disturbance: Secondary | ICD-10-CM | POA: Diagnosis not present

## 2014-05-01 DIAGNOSIS — I480 Paroxysmal atrial fibrillation: Secondary | ICD-10-CM | POA: Diagnosis not present

## 2014-05-01 DIAGNOSIS — E440 Moderate protein-calorie malnutrition: Secondary | ICD-10-CM

## 2014-05-01 LAB — BASIC METABOLIC PANEL
Anion gap: 5 (ref 5–15)
BUN: 9 mg/dL (ref 6–23)
CO2: 27 mmol/L (ref 19–32)
Calcium: 8.9 mg/dL (ref 8.4–10.5)
Chloride: 107 mmol/L (ref 96–112)
Creatinine, Ser: 0.73 mg/dL (ref 0.50–1.35)
GFR, EST NON AFRICAN AMERICAN: 85 mL/min — AB (ref >90)
Glucose, Bld: 113 mg/dL — ABNORMAL HIGH (ref 70–99)
POTASSIUM: 4 mmol/L (ref 3.5–5.1)
Sodium: 139 mmol/L (ref 135–145)

## 2014-05-01 LAB — CBC
HCT: 34.4 % — ABNORMAL LOW (ref 39.0–52.0)
HEMOGLOBIN: 10.5 g/dL — AB (ref 13.0–17.0)
MCH: 26.3 pg (ref 26.0–34.0)
MCHC: 30.5 g/dL (ref 30.0–36.0)
MCV: 86.2 fL (ref 78.0–100.0)
PLATELETS: 167 10*3/uL (ref 150–400)
RBC: 3.99 MIL/uL — ABNORMAL LOW (ref 4.22–5.81)
RDW: 16.2 % — AB (ref 11.5–15.5)
WBC: 6.2 10*3/uL (ref 4.0–10.5)

## 2014-05-01 LAB — GLUCOSE, CAPILLARY: GLUCOSE-CAPILLARY: 103 mg/dL — AB (ref 70–99)

## 2014-05-01 MED ORDER — ENSURE COMPLETE PO LIQD: 237.0000 mL | Freq: Two times a day (BID) | ORAL | Status: AC

## 2014-05-01 NOTE — Discharge Summary (Signed)
Physician Discharge Summary  Eddie [name] LEX:[id number] DOB: 08/06/1932 DOA: 04/29/2014  PCP: Mathews Argyle, MD  Admit date: 04/29/2014 Discharge date: 05/01/2014  Time spent: 45 minutes  Recommendations for Outpatient Follow-up:  Patient will be discharged home. He should continue to follow-up with his primary care physician as well as keep his appointment with his cardiologist next week. Patient should resume his medications as prescribed. Patient should follow a heart healthy diet. Spoke with Mrs. Dowen, patient's wife, regarding fall risk. She does have private sitters that assist.  Discharge Diagnoses:  Syncope with recent fall Severe aortic stenosis Paroxysmal atrial fibrillation History of coronary artery disease Dimension  Thyroid mass Hyperlipidemia History of carotid disease Moderate malnutrition  Discharge Condition: Stable  Diet recommendation: Heart healthy  Filed Weights   04/30/14 0300 04/30/14 0330 05/01/14 0551  Weight: 62.1 kg (136 lb 14.5 oz) 62.869 kg (138 lb 9.6 oz) 61.3 kg (135 lb 2.3 oz)    History of present illness:  by Dr. Berle Mull on 04/30/2014 Eddie [name] is a 81 y.o. male with Past medical history of A. fib, dementia, hypertension, coronary artery disease, severe aortic stenosis, thyroid mass. The patient is presenting with fall. The history was obtained from family members the patient was not communicating significantly. Patient has dementia and was admitted in December after a fall causing hemopneumothorax requiring chest tube. Patient's Pradaxa was resumed on the time of discharge. Patient was referred to cardiology as an outpatient. Patient was also evaluated by neurology who started the patient on Aricept. Patient continued to have orthostatic hypotension based on documentation from neurology notes. Today the patient was walking with a 24-hour caregiver and passed out. Patient denies to me that he has any dizziness but mentions he has  worsening of his chronic speech difficulty before the fall on the time. His blood pressure was 60s over 40s and therefore he was brought here. At the time of my evaluation the patient denies any complaint of headache, dizziness, lightheadedness, focal deficit, chest pain, palpitation, nausea, vomiting, diarrhea, burning urination, cough. Patient had cardiac angiography on [place] with PCI 3 and then he has been following up with cardiology at [place]. And recently moved here in [place].  The patient is coming from home. And at his baseline dependent for most of his ADL.  Hospital Course:  Syncope with recent fall -Likely secondary to orthostatic hypotension, severe aortic stenosis -Appears to be recurrent, patient was supposed to follow-up with cardiology later this month -Echocardiogram 02/22/2014 shows an EF 44-96%, grade 1 diastolic dysfunction, Peak and mean gradients of the aortic valve 70 and 44 mmHg- Severe AS -Patient has had carotid disease in the past- -Carotid doppler: 40-59% right ICA stenosis, highest end of range. 1-39% left ICA stenosis. Bilateral vertebral artery flow is antegrade.   Severe aortic stenosis -Cardiology consulted and appreciated- patient has worsening AF treatment is not an emergency at this time however decision may need to be made within the next year regarding possible treatment.  Patient and wife may follow-up with cardiology/valve clinic. -Echocardiogram as noted above  Paroxysmal atrial fibrillation -Currently rate and rhythm control  -on Pradaxa as well as sotalol -Spoke with wife and cardiology regarding fall risk.  May continue Pradaxa at this time however patient may need to come off anticoagulation at this point.  History of coronary artery disease -Patient's currently chest pain-free, status post stent implantation in 2009 -Continue aspirin, statin, beta blocker  Dementia -Continue Namenda and Aricept  Thyroid  mass -Followup  as an outpatient- thyroid biopsy   Hyperlipidemia -continue statin  History of carotid disease -Status post left carotid endarterectomy -Carotid doppler: 40-59% right ICA stenosis, highest end of range. 1-39% left ICA stenosis. Bilateral vertebral artery flow is antegrade.   Moderate malnutrition -Continue feeding supplementation  Code Status: DNR  Procedures: Carotid doppler: 40-59% right ICA stenosis, highest end of range. 1-39% left ICA stenosis. Bilateral vertebral artery flow is antegrade.   Consultations: Cardiology  Discharge Exam: Filed Vitals:   05/01/14 0551  BP: 139/56  Pulse: 66  Temp: 97.6 F (36.4 C)  Resp: 18   Exam  General: Well developed,  NAD, appears stated age  HEENT: NCAT, mucous membranes moist.   Cardiovascular: S1 S2 auscultated, RRR, 2/6 SEM  Respiratory: Clear to auscultation bilaterally with equal chest rise  Abdomen: Soft, nontender, nondistended, + bowel sounds  Extremities: warm dry without cyanosis clubbing or edema  Discharge Instructions      Discharge Instructions    Discharge instructions    Complete by:  As directed   Patient will be discharged home. He should continue to follow-up with his primary care physician as well as keep his appointment with his cardiologist next week. Patient should resume his medications as prescribed. Patient should follow a heart healthy diet.            Medication List    STOP taking these medications        HYDROcodone-acetaminophen 5-325 MG per tablet  Commonly known as:  NORCO/VICODIN      TAKE these medications        acetaminophen 500 MG tablet  Commonly known as:  TYLENOL  Take 500 mg by mouth every 6 (six) hours as needed for mild pain.     aspirin EC 81 MG tablet  Take 81 mg by mouth daily.     atorvastatin 80 MG tablet  Commonly known as:  LIPITOR  Take 80 mg by mouth daily.     b complex vitamins tablet  Take 1 tablet by mouth daily.      cholecalciferol 1000 UNITS tablet  Commonly known as:  VITAMIN D  Take 1,000 Units by mouth daily.     dabigatran 150 MG Caps capsule  Commonly known as:  PRADAXA  Take 1 capsule (150 mg total) by mouth 2 (two) times daily.     donepezil 10 MG tablet  Commonly known as:  ARICEPT  Take 1 tablet (10 mg total) by mouth daily.     ezetimibe 10 MG tablet  Commonly known as:  ZETIA  Take 10 mg by mouth daily.     feeding supplement (ENSURE COMPLETE) Liqd  Take 237 mLs by mouth 2 (two) times daily between meals.     fluticasone 50 MCG/ACT nasal spray  Commonly known as:  FLONASE  Place 2 sprays into both nostrils daily.     gabapentin 400 MG capsule  Commonly known as:  NEURONTIN  Take 400 mg by mouth daily.     magnesium oxide 400 MG tablet  Commonly known as:  MAG-OX  Take 400 mg by mouth daily.     memantine 28 MG Cp24 24 hr capsule  Commonly known as:  NAMENDA XR  Take 28 mg by mouth daily.     metroNIDAZOLE 1 % gel  Commonly known as:  METROGEL  Apply 1 application topically 2 (two) times daily as needed (rosacea). To face     sotalol 80 MG tablet  Commonly known as:  BETAPACE  Take 40 mg by mouth 2 (two) times daily.       No Known Allergies Follow-up Information    Follow up with Mathews Argyle, MD. Schedule an appointment as soon as possible for a visit in 1 week.   Specialty:  Internal Medicine   Why:  Hospital follow up   Contact information:   [address] [place] [address]        The results of significant diagnostics from this hospitalization (including imaging, microbiology, ancillary and laboratory) are listed below for reference.    Significant Diagnostic Studies: Dg Chest 2 View  04/29/2014   CLINICAL DATA:  Acute onset of generalized weakness. Initial encounter.  EXAM: CHEST  2 VIEW  COMPARISON:  Chest radiograph performed 02/24/2014  FINDINGS: The lungs are well-aerated. A small right pleural effusion is again noted,  improved from the prior study. There is no evidence of pneumothorax.  The heart is normal in size; the mediastinal contour is within normal limits. Previously noted displaced rib fractures are again seen, with minimal callus formation.  IMPRESSION: Small right pleural effusion is improved from the prior study. Multiple displaced right-sided rib fractures again noted, similar in appearance to the prior study, with minimal associated callus formation.   Electronically Signed   By: Garald Balding M.D.   On: 04/29/2014 23:59   Ct Head Wo Contrast  04/30/2014   CLINICAL DATA:  Syncopal episode tonight. Previous syncopal episodes in December. Dementia.  EXAM: CT HEAD WITHOUT CONTRAST  TECHNIQUE: Contiguous axial images were obtained from the base of the skull through the vertex without intravenous contrast.  COMPARISON:  03/10/2014  FINDINGS: Diffuse cerebral atrophy. Ventricular dilatation consistent with central atrophy. Low-attenuation changes in the deep white matter consistent with small vessel ischemia. No mass effect or midline shift. No abnormal extra-axial fluid collections. Gray-white matter junctions are distinct. Basal cisterns are not effaced. No evidence of acute intracranial hemorrhage. No depressed skull fractures. Visualized paranasal sinuses and mastoid air cells are not opacified. Vascular calcifications.  IMPRESSION: No acute intracranial abnormalities. Chronic atrophy and small vessel ischemic changes. Similar appearance to prior study.   Electronically Signed   By: Lucienne Capers M.D.   On: 04/30/2014 01:15   US Soft Tissue Head/neck  04/28/2014   CLINICAL DATA:  Right thyroid mass noted on CT chest  EXAM: THYROID ULTRASOUND  TECHNIQUE: Ultrasound examination of the thyroid gland and adjacent soft tissues was performed.  COMPARISON:  None.  FINDINGS: Right thyroid lobe  Measurements: 4.9 x 2.5 x 2.4 cm. Heterogeneous tissue. 2.8 x 2.2 x 2.3 cm complex solid and cystic lesion in the lower pole.   Left thyroid lobe  Measurements: 4.5 x 1.3 x 1.6 cm. Heterogeneous tissue. Very small left-sided nodules measuring 6 mm or less.  Isthmus  Thickness: 3 mm.  No nodules visualized.  Lymphadenopathy  None visualized.  IMPRESSION: Bilateral nodules. Dominant complex right lower pole nodule measures 2.8 cm. Findings meet consensus criteria for biopsy. Ultrasound-guided fine needle aspiration should be considered, as per the consensus statement: Management of Thyroid Nodules Detected at [place]: Society of Radiologists in [place]. Radiology 2005; [id number].   Electronically Signed   By: Marybelle Killings M.D.   On: 04/28/2014 15:38    Microbiology: No results found for this or any previous visit (from the past 240 hour(s)).   Labs: Basic Metabolic Panel:  Recent Labs Lab 04/29/14 2332 04/30/14 0920 05/01/14 0418  NA 140 143 139  K  3.9 4.2 4.0  CL 104 108 107  CO2 32 27 27  GLUCOSE 98 139* 113*  BUN 19 13 9   CREATININE 0.93 0.83 0.73  CALCIUM 9.2 8.6 8.9   Liver Function Tests:  Recent Labs Lab 04/29/14 2332 04/30/14 0920  AST 24 21  ALT 22 17  ALKPHOS 93 76  BILITOT 0.7 0.4  PROT 7.0 5.7*  ALBUMIN 3.5 2.9*    Recent Labs Lab 04/29/14 2332  LIPASE 44   No results for input(s): AMMONIA in the last 168 hours. CBC:  Recent Labs Lab 04/29/14 2332 04/30/14 0920 05/01/14 0418  WBC 6.4 5.4 6.2  NEUTROABS 3.8 3.8  --   HGB 11.6* 10.5* 10.5*  HCT 37.7* 34.7* 34.4*  MCV 84.9 86.8 86.2  PLT 207 172 167   Cardiac Enzymes: No results for input(s): CKTOTAL, CKMB, CKMBINDEX, TROPONINI in the last 168 hours. BNP: BNP (last 3 results) No results for input(s): BNP in the last 8760 hours.  ProBNP (last 3 results) No results for input(s): PROBNP in the last 8760 hours.  CBG:  Recent Labs Lab 04/30/14 0655 05/01/14 0813  GLUCAP 155* 103*       Signed:  Cristal Ford  [facility] 05/01/2014, 12:24 PM

## 2014-05-01 NOTE — Discharge Instructions (Signed)
Syncope °Syncope is a medical term for fainting or passing out. This means you lose consciousness and drop to the ground. People are generally unconscious for less than 5 minutes. You may have some muscle twitches for up to 15 seconds before waking up and returning to normal. Syncope occurs more often in older adults, but it can happen to anyone. While most causes of syncope are not dangerous, syncope can be a sign of a serious medical problem. It is important to seek medical care.  °CAUSES  °Syncope is caused by a sudden drop in blood flow to the brain. The specific cause is often not determined. Factors that can bring on syncope include: °· Taking medicines that lower blood pressure. °· Sudden changes in posture, such as standing up quickly. °· Taking more medicine than prescribed. °· Standing in one place for too long. °· Seizure disorders. °· Dehydration and excessive exposure to heat. °· Low blood sugar (hypoglycemia). °· Straining to have a bowel movement. °· Heart disease, irregular heartbeat, or other circulatory problems. °· Fear, emotional distress, seeing blood, or severe pain. °SYMPTOMS  °Right before fainting, you may: °· Feel dizzy or light-headed. °· Feel nauseous. °· See all white or all black in your field of vision. °· Have cold, clammy skin. °DIAGNOSIS  °Your health care provider will ask about your symptoms, perform a physical exam, and perform an electrocardiogram (ECG) to record the electrical activity of your heart. Your health care provider may also perform other heart or blood tests to determine the cause of your syncope which may include: °· Transthoracic echocardiogram (TTE). During echocardiography, sound waves are used to evaluate how blood flows through your heart. °· Transesophageal echocardiogram (TEE). °· Cardiac monitoring. This allows your health care provider to monitor your heart rate and rhythm in real time. °· Holter monitor. This is a portable device that records your  heartbeat and can help diagnose heart arrhythmias. It allows your health care provider to track your heart activity for several days, if needed. °· Stress tests by exercise or by giving medicine that makes the heart beat faster. °TREATMENT  °In most cases, no treatment is needed. Depending on the cause of your syncope, your health care provider may recommend changing or stopping some of your medicines. °HOME CARE INSTRUCTIONS °· Have someone stay with you until you feel stable. °· Do not drive, use machinery, or play sports until your health care provider says it is okay. °· Keep all follow-up appointments as directed by your health care provider. °· Lie down right away if you start feeling like you might faint. Breathe deeply and steadily. Wait until all the symptoms have passed. °· Drink enough fluids to keep your urine clear or pale yellow. °· If you are taking blood pressure or heart medicine, get up slowly and take several minutes to sit and then stand. This can reduce dizziness. °SEEK IMMEDIATE MEDICAL CARE IF:  °· You have a severe headache. °· You have unusual pain in the chest, abdomen, or back. °· You are bleeding from your mouth or rectum, or you have black or tarry stool. °· You have an irregular or very fast heartbeat. °· You have pain with breathing. °· You have repeated fainting or seizure-like jerking during an episode. °· You faint when sitting or lying down. °· You have confusion. °· You have trouble walking. °· You have severe weakness. °· You have vision problems. °If you fainted, call your local emergency services (911 in [place] Do not drive   yourself to the hospital.  °MAKE SURE YOU: °· Understand these instructions. °· Will watch your condition. °· Will get help right away if you are not doing well or get worse. °Document Released: 03/06/2005 Document Revised: 03/11/2013 Document Reviewed: 05/05/2011 °[employer]® Patient Information ©2015 [employer]. This information is not intended to replace  advice given to you by your health care provider. Make sure you discuss any questions you have with your health care provider. ° °

## 2014-05-01 NOTE — Progress Notes (Signed)
Patient Name: Eddie [name] Date of Encounter: 05/01/2014  Principal Problem:  Orthostatic syncope Active Problems:  Severe calcific aortic valve stenosis  Paroxysmal atrial fibrillation  Dementia  Thyroid mass  Dyslipidemia  Malnutrition of moderate degree   SUBJECTIVE  Pt only oriented to person at present. Denies CP, SOB, or dizziness upon standing. Wife at bedside and stated the family member that stayed with him last night said he slept well.   CURRENT MEDS . aspirin EC 81 mg Oral Daily  . atorvastatin 80 mg Oral Daily  . dabigatran 150 mg Oral Q12H  . donepezil 10 mg Oral Daily  . ezetimibe 10 mg Oral Daily  . feeding supplement (ENSURE COMPLETE) 237 mL Oral BID BM  . fluticasone 2 spray Each Nare Daily  . gabapentin 400 mg Oral Daily  . memantine 28 mg Oral Daily  . sodium chloride 3 mL Intravenous Q12H  . sotalol 40 mg Oral BID    OBJECTIVE  Filed Vitals:   04/30/14 0937 04/30/14 2053 05/01/14 0217 05/01/14 0551  BP: 110/48 112/43 136/42 139/56  Pulse: 72 94 63 66  Temp: 99 F (37.2 C) 98.5 F (36.9 C) 98.1 F (36.7 C) 97.6 F (36.4 C)  TempSrc: Oral Oral Oral Oral  Resp: 16 18 18 18   Height:      Weight:    135 lb 2.3 oz (61.3 kg)  SpO2: 98% 100% 100% 98%    Intake/Output Summary (Last 24 hours) at 05/01/14 0854 Last data filed at 05/01/14 0840  Gross per 24 hour  Intake  1080 ml  Output  1320 ml  Net  -240 ml   Filed Weights   04/30/14 0300 04/30/14 0330 05/01/14 0551  Weight: 136 lb 14.5 oz (62.1 kg) 138 lb 9.6 oz (62.869 kg) 135 lb 2.3 oz (61.3 kg)    PHYSICAL EXAM  General: Pleasant, NAD. Neuro: Alert and oriented X 1. Moves all extremities spontaneously. Psych: Flat affect. HEENT: Normal Neck: Supple without bruits or JVD. Lungs: Resp regular and unlabored, CTA. Heart: Systolic murmur  2/6. RRR no s3, s4. Abdomen: Soft, non-tender, non-distended, BS + x 4.  Extremities: No clubbing, cyanosis or edema. DP/PT/Radials 2+ and equal bilaterally.  Accessory Clinical Findings  CBC  Recent Labs (last 2 labs)      Recent Labs  04/29/14 2332 04/30/14 0920 05/01/14 0418  WBC 6.4 5.4 6.2  NEUTROABS 3.8 3.8 --   HGB 11.6* 10.5* 10.5*  HCT 37.7* 34.7* 34.4*  MCV 84.9 86.8 86.2  PLT 207 172 167     Basic Metabolic Panel  Recent Labs (last 2 labs)      Recent Labs  04/30/14 0920 05/01/14 0418  NA 143 139  K 4.2 4.0  CL 108 107  CO2 27 27  GLUCOSE 139* 113*  BUN 13 9  CREATININE 0.83 0.73  CALCIUM 8.6 8.9     Liver Function Tests  Recent Labs (last 2 labs)      Recent Labs  04/29/14 2332 04/30/14 0920  AST 24 21  ALT 22 17  ALKPHOS 93 76  BILITOT 0.7 0.4  PROT 7.0 5.7*  ALBUMIN 3.5 2.9*      Recent Labs (last 2 labs)      Recent Labs  04/29/14 2332  LIPASE 44     TELE  SR, 78.   2D Echocardiogram 12.6.2015  Study Conclusions  - Left ventricle: The cavity size was normal. Wall thickness was increased in a pattern of mild LVH. Systolic  function was normal. The estimated ejection fraction was in the range of 60% to 65%. Doppler parameters are consistent with abnormal left ventricular relaxation (grade 1 diastolic dysfunction). - Aortic valve: AV is thickened, Calcified with restricted motion Peak and mean gradients through the valve are 70 and 44 mm Hg respectively consistent with severe AS. There was mild regurgitation. - Mitral valve: There was mild regurgitation. - Left atrium: The atrium was mildly dilated. - Pulmonary arteries: PA peak pressure: 41 mm Hg (S). _____________  Radiology/Studies  Dg Chest 2 View  04/29/2014 CLINICAL DATA: Acute onset of generalized weakness. Initial encounter. EXAM: CHEST 2 VIEW COMPARISON:  Chest radiograph performed 02/24/2014 FINDINGS: The lungs are well-aerated. A small right pleural effusion is again noted, improved from the prior study. There is no evidence of pneumothorax. The heart is normal in size; the mediastinal contour is within normal limits. Previously noted displaced rib fractures are again seen, with minimal callus formation. IMPRESSION: Small right pleural effusion is improved from the prior study. Multiple displaced right-sided rib fractures again noted, similar in appearance to the prior study, with minimal associated callus formation. Electronically Signed By: Garald Balding M.D. On: 04/29/2014 23:59   Ct Head Wo Contrast  04/30/2014 CLINICAL DATA: Syncopal episode tonight. Previous syncopal episodes in December. Dementia. EXAM: CT HEAD WITHOUT CONTRAST TECHNIQUE: Contiguous axial images were obtained from the base of the skull through the vertex without intravenous contrast. COMPARISON: 03/10/2014 FINDINGS: Diffuse cerebral atrophy. Ventricular dilatation consistent with central atrophy. Low-attenuation changes in the deep white matter consistent with small vessel ischemia. No mass effect or midline shift. No abnormal extra-axial fluid collections. Gray-white matter junctions are distinct. Basal cisterns are not effaced. No evidence of acute intracranial hemorrhage. No depressed skull fractures. Visualized paranasal sinuses and mastoid air cells are not opacified. Vascular calcifications. IMPRESSION: No acute intracranial abnormalities. Chronic atrophy and small vessel ischemic changes. Similar appearance to prior study. Electronically Signed By: Lucienne Capers M.D. On: 04/30/2014 01:15   ASSESSMENT AND PLAN  1. Presyncope/syncopeOrthostatic hypotension: Had an episode of syncope 2 months ago and presented this admission with presyncope and fall with associated drop in BP to 60s/40s. He has a history of paroxysmal atrial fibrillation with RVR for  which he has been on Sotalol 40 mg BID. Echo on 02/22/14 reveals severe aortic stenosis and EF of 60-65%. + orthostatic VS on 04/30/14. Presently he is in sinus rhythm, 78.Continue Sotalol 40 mg BID.   2. Severe aortic stenosis: History of CAD with s/p PIC with 3 stent implantation in [place], [place] in 2009. Echo on 02/22/14 reveals severe aortic stenosis and EF of 60-65%. Systolic murmur 2/6 audible on exam. R/L cardiac catheterization and TAVR to be discussed with family by Dr. Loletha Grayer today.   3. CAD: S/p PIC with stent implantation x 3 in 2009. Denies CP.   4. Paroxysmal A. Fib: History of paroxysmal atrial fibrillation with RVR for which he has been on Sotalol 40 mg BID. Currently SR, 78. Currently taking Pradaxa 150 mg po BID, concern if this should be continued due to history of recurrent falls.  5. Hyperlipidemia: Currently taking Atorvastatin 80mg  and Ezetimibe 10 mg. Goal LDL less than 70.   Signed, Murray Hodgkins NP     I have seen and examined the patient along with Murray Hodgkins NP.  I have reviewed the chart, notes and new data.  I agree with NP's note.  Key new complaints: looks very comfortable, mostly nonverbal, disoriented; the conversation was entirely with his  spouse Key examination changes: late peaking AS murmur, distinct but soft S2, no carotid delay Key new findings / data: severe AS by echo  PLAN: Fall/syncope is highly consistent with orthostatic hypotension and not with AS. Worsening AS may be limiting his ability to compensate for hypotension, though. Treatment for his AS is not an emergency, but it is likely that AS will become symptomatic and need a decision this year. He may be a candidate for AVR physically, but I think his dementia is a major disincentive for surgery. Discussed SAVR/TAVR in detail with his wife. I think the workup, should they decide to pursue it, can start with a valve clinic appointment as an outpatient. Needs behavioral  adjustment and constant supervision to avoid falls. A bedside commode might be a good safety precaution. While some of his meds may contribute to orthostatic hypotension, they are unlikely to be a major problem in the doses he is receiving.  Sanda Klein, MD, [place] [phone] 05/01/2014, 11:00 AM

## 2014-05-05 ENCOUNTER — Encounter: Payer: Self-pay | Admitting: Cardiovascular Disease

## 2014-05-06 ENCOUNTER — Ambulatory Visit: Payer: Medicare Other | Admitting: Cardiovascular Disease

## 2014-05-07 ENCOUNTER — Inpatient Hospital Stay: Admission: RE | Admit: 2014-05-07 | Payer: Medicare Other | Source: Ambulatory Visit

## 2014-05-11 DIAGNOSIS — I4891 Unspecified atrial fibrillation: Secondary | ICD-10-CM | POA: Diagnosis not present

## 2014-05-11 DIAGNOSIS — G301 Alzheimer's disease with late onset: Secondary | ICD-10-CM | POA: Diagnosis not present

## 2014-05-11 DIAGNOSIS — E041 Nontoxic single thyroid nodule: Secondary | ICD-10-CM | POA: Diagnosis not present

## 2014-05-11 DIAGNOSIS — I951 Orthostatic hypotension: Secondary | ICD-10-CM | POA: Diagnosis not present

## 2014-05-11 DIAGNOSIS — L989 Disorder of the skin and subcutaneous tissue, unspecified: Secondary | ICD-10-CM | POA: Diagnosis not present

## 2014-05-12 ENCOUNTER — Ambulatory Visit (INDEPENDENT_AMBULATORY_CARE_PROVIDER_SITE_OTHER): Payer: Medicare Other | Admitting: Cardiovascular Disease

## 2014-05-12 VITALS — BP 130/76 | HR 59 | Ht 72.0 in | Wt 138.2 lb

## 2014-05-12 DIAGNOSIS — Z13220 Encounter for screening for lipoid disorders: Secondary | ICD-10-CM

## 2014-05-12 DIAGNOSIS — I951 Orthostatic hypotension: Secondary | ICD-10-CM | POA: Diagnosis not present

## 2014-05-12 DIAGNOSIS — I350 Nonrheumatic aortic (valve) stenosis: Secondary | ICD-10-CM | POA: Diagnosis not present

## 2014-05-12 DIAGNOSIS — I739 Peripheral vascular disease, unspecified: Secondary | ICD-10-CM

## 2014-05-12 DIAGNOSIS — R55 Syncope and collapse: Secondary | ICD-10-CM | POA: Diagnosis not present

## 2014-05-12 DIAGNOSIS — F0390 Unspecified dementia without behavioral disturbance: Secondary | ICD-10-CM

## 2014-05-12 DIAGNOSIS — I480 Paroxysmal atrial fibrillation: Secondary | ICD-10-CM | POA: Diagnosis not present

## 2014-05-12 DIAGNOSIS — I779 Disorder of arteries and arterioles, unspecified: Secondary | ICD-10-CM

## 2014-05-12 NOTE — Patient Instructions (Addendum)
Your physician has recommended that you wear an event monitor. Event monitors are medical devices that record the heart's electrical activity. Doctors most often [place] these monitors to diagnose arrhythmias. Arrhythmias are problems with the speed or rhythm of the heartbeat. The monitor is a small, portable device. You can wear one while you do your normal daily activities. This is usually used to diagnose what is causing palpitations/syncope (passing out).  Your physician recommends that you return for lab work in: 6 weeks.  Your physician has recommended you make the following change in your medication: [place].  Your physician recommends that you schedule a follow-up appointment in: 6 WEEKS.  Compression Stockings Compression stockings are elastic stockings that "compress" your legs. This helps to increase blood flow, decrease swelling, and reduces the chance of getting blood clots in your lower legs. Compression stockings are used:  After surgery.  If you have a history of poor circulation.  If you are prone to blood clots.  If you have varicose veins.  If you sit or are bedridden for long periods of time. WEARING COMPRESSION STOCKINGS  Your compression stockings should be worn as instructed by your caregiver.  Wearing the correct stocking size is important. Your caregiver can help measure and fit you to the correct size.  When wearing your stockings, do not allow the stockings to bunch up. This is especially important around your toes or behind your knees. Keep the stockings as smooth as possible.  Do not roll the stockings downward and leave them rolled down. This can form a restrictive band around your legs and can decrease blood flow.  The stockings should be removed once a day for 1 hour or as instructed by your caregiver. When the stockings are taken off, inspect your legs and feet. Look for:  Open sores.  Red spots.  Puffy areas (swelling).  Anything that does  not seem normal. IMPORTANT INFORMATION ABOUT COMPRESSION STOCKINGS  The compression stockings should be clean, dry, and in good condition before you put them on.  Do not put lotion on your legs or feet. This makes it harder to put the stockings on.  Change your stockings immediately if they become wet or soiled.  Do not wear stockings that are ripped or torn.  You may hand-wash or put your stockings in the washing machine. Use cold or warm water with mild detergent. Do not bleach your stockings. They may be air-dried or dried in the dryer on low heat.  If you have pain or have a feeling of "pins and needles" in your feet or legs, you may be wearing stockings that are too tight. Call your caregiver right away. SEEK IMMEDIATE MEDICAL CARE IF:   You have numbness or tingling in your lower legs that does not get better quickly after the stockings are removed.  Your toes or feet become cold and blue.  You develop open sores or have red spots on your legs that do not go away. MAKE SURE YOU:   Understand these instructions.  Will watch your condition.  Will get help right away if you are not doing well or get worse. Document Released: 01/01/2009 Document Revised: 05/29/2011 Document Reviewed: 01/01/2009 [facility] Patient Information 2015 [place], [place]. This information is not intended to replace advice given to you by your health care provider. Make sure you discuss any questions you have with your health care provider.

## 2014-05-13 DIAGNOSIS — R55 Syncope and collapse: Secondary | ICD-10-CM | POA: Diagnosis not present

## 2014-05-14 ENCOUNTER — Encounter: Payer: Self-pay | Admitting: Cardiovascular Disease

## 2014-05-14 DIAGNOSIS — I739 Peripheral vascular disease, unspecified: Secondary | ICD-10-CM

## 2014-05-14 DIAGNOSIS — I779 Disorder of arteries and arterioles, unspecified: Secondary | ICD-10-CM | POA: Insufficient documentation

## 2014-05-14 NOTE — Progress Notes (Signed)
[name] ID: Eddie [name], male   DOB: [birth year], 82 y.o.   MRN: [id number]     HPI: Eddie [name] is a 80 y.o. male who presents to Eddie office today for a follow up cardiology evaluation following Eddie [name] recent hospitalization for syncope.  Eddie [name]  has a history of coronary artery disease and underwent prior PCI with 3 stent implantation in [place], [place] in 2009. Eddie [name]  is status post left carotid endarterectomy. Eddie [name]  recently move to [place] from [place]. Eddie [name] has a history of paroxysmal atrial fibrillation and aortic stenosis. In December 2015 Eddie [name] experienced a syncopal spell while on Pradaxa and had fractured ribs and developed a hemopneumothorax requiring chest tube insertion. Eddie [name] has been documented to have significant orthostatic hypotension and has a five-year history of dementia. Eddie [name] was recently re-admitted to [facility] hospital several weeks ago after having a significant fall at home. When Eddie [name] was evaluated at home Eddie [name] blood pressure had dropped to 66 systolically. Eddie [name] was admitted by Eddie internal medicine service. Eddie [name] is being followed for Eddie [name] orthostatic hypotension and I saw Eddie [name] for cardiology consultation  concerning  aortic stenosis.  An echo Doppler study in December revealed severe aortic stenosis with a peak instantaneous gradient of 17 a mean gradient of 44, compatible with severe aortic stenosis.  It was felt that Eddie [name] presyncope/syncope was most likely related to combination of Eddie [name] aortic valve stenosis as well as significant orthostatic hypotension.  During Eddie [name] hospitalization had a lengthy conversation with Eddie [name], Eddie [name] wife and daughter concerning symptomatic aortic stenosis.  I discussed potential symptoms of chest pain, presyncope/syncope, as well as dyspnea and CHF.  I discussed advancements with aortic valve replacement including T aVR therapy.  Eddie [name] however does have significant dementia and has been on Aricept and Namenda.  Eddie [name] has been on  low-dose sotalol at just 40 mg twice a day without recurrent atrial fibrillation.  Eddie [name] has a history of carotid disease and underwent carotid studies which revealed 40-59% ICA stenosis on Eddie right and 139% on Eddie left.  Bilateral antegrade vertebral artery flow.   Discharge, Eddie [name] was sent home back on Pradaxa 150 g twice a day.  Eddie [name] also has a history of hyperlipidemia and has been on atorvastatin 80 mg and Zetia.  Eddie [name] presents for follow up evaluation.  Since discharge, Eddie [name] denies any recurrent episodes of chest pain or significant dizziness.  Eddie [name] has been unaware of any recurrent palpitations.   Past Medical History  Diagnosis Date  . A-fib   . Dementia   . HTN (hypertension)   . CAD (coronary artery disease)     s/p PCI x 3 (09/2007)  . Carotid artery stenosis     left  . Sciatica   . Pneumothorax 02/18/2014  . Thrombocytopenia   . Thyroid mass   . Memory loss   . Syncope and collapse   . Vision abnormalities     Past Surgical History  Procedure Laterality Date  . Coronary stent placement  2009  . Hernia repair    . Carotid endarterectomy    . Cataract extraction    . Chest tube insertion Right 02/18/2014  . Inguinal hernia repair    . Tonsillectomy    . Coronary angioplasty with stent placement    . Achilles tendon repair Left     No Known Allergies  Current Outpatient Prescriptions  Medication Sig Dispense Refill  . acetaminophen (TYLENOL) 500 MG tablet Take 500 mg by mouth every  6 (six) hours as needed for mild pain.    Marland Kitchen aspirin EC 81 MG tablet Take 81 mg by mouth daily.    Marland Kitchen atorvastatin (LIPITOR) 80 MG tablet Take 80 mg by mouth daily.    Marland Kitchen b complex vitamins tablet Take 1 tablet by mouth daily.    . cholecalciferol (VITAMIN D) 1000 UNITS tablet Take 1,000 Units by mouth daily.    Marland Kitchen donepezil (ARICEPT) 10 MG tablet Take 1 tablet (10 mg total) by mouth daily. 30 tablet 11  . ezetimibe (ZETIA) 10 MG tablet Take 10 mg by mouth daily.    . feeding supplement, ENSURE  COMPLETE, (ENSURE COMPLETE) LIQD Take 237 mLs by mouth 2 (two) times daily between meals.    . fluticasone (FLONASE) 50 MCG/ACT nasal spray Place 2 sprays into both nostrils daily.    Marland Kitchen gabapentin (NEURONTIN) 400 MG capsule Take 400 mg by mouth daily.    . magnesium oxide (MAG-OX) 400 MG tablet Take 400 mg by mouth daily.    . memantine (NAMENDA XR) 28 MG CP24 24 hr capsule Take 28 mg by mouth daily.    . metroNIDAZOLE (METROGEL) 1 % gel Apply 1 application topically 2 (two) times daily as needed (rosacea). To face    . sotalol (BETAPACE) 80 MG tablet Take 40 mg by mouth 2 (two) times daily.     No current facility-administered medications for this visit.    History   Social History  . Marital Status: Married    Spouse Name: N/A  . Number of Children: N/A  . Years of Education: N/A   Occupational History  . Not on file.   Social History Main Topics  . Smoking status: Never Smoker   . Smokeless tobacco: Never Used  . Alcohol Use: Yes     Comment: occasionally   . Drug Use: No  . Sexual Activity: Not on file   Other Topics Concern  . Not on file   Social History Narrative   Married   No children but nieces and nephews and friends    Recently moved to [facility] 01/2014 to be closer to above    Denies h/o smoking, caffeine    Lives at home with wife at [employer]    No PCP possibly interested in Dr. Hartley Barefoot.     Family History  Problem Relation Age of Onset  . Heart disease Mother   . Diabetes Father     ROS General: Negative; No fevers, chills, or night sweats; Positive for fatigue HEENT: Negative; No changes in vision or hearing, sinus congestion, difficulty swallowing Pulmonary: Negative; No cough, wheezing, shortness of breath, hemoptysis Cardiovascular: See HPI: No chest pain, presyncope, syncope, palpatations GI: Negative; No nausea, vomiting, diarrhea, or abdominal pain GU: Negative; No dysuria, hematuria, or difficulty voiding Musculoskeletal:  Negative; no myalgias, joint pain, or weakness Hematologic: Negative; no easy bruising, bleeding Endocrine: Negative; no heat/cold intolerance; no diabetes, Neuro: Positive for dementia Skin: Negative; No rashes or skin lesions Psychiatric: Negative; No behavioral problems, depression Sleep: Negative; No snoring,  daytime sleepiness, hypersomnolence, bruxism, restless legs, hypnogognic hallucinations. Other comprehensive 14 point system review is negative   Physical Exam BP 130/76 mmHg  Pulse 59  Ht 6' (1.829 m)  Wt 138 lb 3.2 oz (62.687 kg)  BMI 18.74 kg/m2  Repeat blood pressure was 137/71 with a pulse of 61 supine, and 102/65 with a pulse of 91 standing immediately and after 3 minutes 119/65 with a pulse of 73.  General: Alert, oriented, no distress.  Skin: normal turgor, no rashes, warm and dry HEENT: Normocephalic, atraumatic. Pupils equal round and reactive to light; sclera anicteric; extraocular muscles intact, No lid lag; Nose without nasal septal hypertrophy; Mouth/Parynx benign; Mallinpatti scale 3 Neck: No JVD, bruit versus transmitted murmurs.  Status post left carotid endarterectomy. Lungs: clear to ausculatation and percussion bilaterally; no wheezing or rales, normal inspiratory and expiratory effort Chest wall: without tenderness to palpitation Heart: PMI not displaced, RRR, s1 s2 normal, 2/6 harsh systolic murmur in Eddie aortic region compatible with Eddie [name] aortic stenosis.  No AR appreciated.  No rubs thrills or heaves. Abdomen: soft, nontender; no hepatosplenomehaly, BS+; abdominal aorta nontender and not dilated by palpation. Back: no CVA tenderness Pulses: 2+  Musculoskeletal: full range of motion, normal strength, no joint deformities Extremities: Pulses 2+, no clubbing cyanosis or edema, Homan's sign negative  Neurologic: grossly nonfocal; Cranial nerves grossly wnl Psychologic: Normal mood and affect   ECG (independently read by me): Sinus bradycardia 59 bpm.  PR  interval normal at 158 ms.  QTc interval slightly increased at 477 ms.  Small U wave.  LABS:  BMP Latest Ref Rng 05/01/2014 04/30/2014 04/29/2014  Glucose 70 - 99 mg/dL 113(H) 139(H) 98  BUN 6 - 23 mg/dL 9 13 19   Creatinine 0.50 - 1.35 mg/dL 0.73 0.83 0.93  Sodium 135 - 145 mmol/L 139 143 140  Potassium 3.5 - 5.1 mmol/L 4.0 4.2 3.9  Chloride 96 - 112 mmol/L 107 108 104  CO2 19 - 32 mmol/L 27 27 32  Calcium 8.4 - 10.5 mg/dL 8.9 8.6 9.2    Hepatic Function Latest Ref Rng 04/30/2014 04/29/2014 02/18/2014  Total Protein 6.0 - 8.3 g/dL 5.7(L) 7.0 6.4  Albumin 3.5 - 5.2 g/dL 2.9(L) 3.5 3.7  AST 0 - 37 U/L 21 24 30   ALT 0 - 53 U/L 17 22 25   Alk Phosphatase 39 - 117 U/L 76 93 63  Total Bilirubin 0.3 - 1.2 mg/dL 0.4 0.7 0.9     CBC Latest Ref Rng 05/01/2014 04/30/2014 04/29/2014  WBC 4.0 - 10.5 K/uL 6.2 5.4 6.4  Hemoglobin 13.0 - 17.0 g/dL 10.5(L) 10.5(L) 11.6(L)  Hematocrit 39.0 - 52.0 % 34.4(L) 34.7(L) 37.7(L)  Platelets 150 - 400 K/uL 167 172 207     BNP No results found for: BNP  ProBNP No results found for: PROBNP   Lipid Panel  No results found for: CHOL, TRIG, HDL, CHOLHDL, VLDL, LDLCALC, LDLDIRECT   RADIOLOGY: Dg Chest 2 View  04/29/2014   CLINICAL DATA:  Acute onset of generalized weakness. Initial encounter.  EXAM: CHEST  2 VIEW  COMPARISON:  Chest radiograph performed 02/24/2014  FINDINGS: Eddie lungs are well-aerated. A small right pleural effusion is again noted, improved from Eddie prior study. There is no evidence of pneumothorax.  Eddie heart is normal in size; Eddie mediastinal contour is within normal limits. Previously noted displaced rib fractures are again seen, with minimal callus formation.  IMPRESSION: Small right pleural effusion is improved from Eddie prior study. Multiple displaced right-sided rib fractures again noted, similar in appearance to Eddie prior study, with minimal associated callus formation.   Electronically Signed   By: Garald Balding M.D.   On: 04/29/2014  23:59   Ct Head Wo Contrast  04/30/2014   CLINICAL DATA:  Syncopal episode tonight. Previous syncopal episodes in December. Dementia.  EXAM: CT HEAD WITHOUT CONTRAST  TECHNIQUE: Contiguous axial images were obtained from Eddie base of Eddie skull through Eddie  vertex without intravenous contrast.  COMPARISON:  03/10/2014  FINDINGS: Diffuse cerebral atrophy. Ventricular dilatation consistent with central atrophy. Low-attenuation changes in Eddie deep white matter consistent with small vessel ischemia. No mass effect or midline shift. No abnormal extra-axial fluid collections. Gray-white matter junctions are distinct. Basal cisterns are not effaced. No evidence of acute intracranial hemorrhage. No depressed skull fractures. Visualized paranasal sinuses and mastoid air cells are not opacified. Vascular calcifications.  IMPRESSION: No acute intracranial abnormalities. Chronic atrophy and small vessel ischemic changes. Similar appearance to prior study.   Electronically Signed   By: Lucienne Capers M.D.   On: 04/30/2014 01:15   US Soft Tissue Head/neck  04/28/2014   CLINICAL DATA:  Right thyroid mass noted on CT chest  EXAM: THYROID ULTRASOUND  TECHNIQUE: Ultrasound examination of Eddie thyroid gland and adjacent soft tissues was performed.  COMPARISON:  None.  FINDINGS: Right thyroid lobe  Measurements: 4.9 x 2.5 x 2.4 cm. Heterogeneous tissue. 2.8 x 2.2 x 2.3 cm complex solid and cystic lesion in Eddie lower pole.  Left thyroid lobe  Measurements: 4.5 x 1.3 x 1.6 cm. Heterogeneous tissue. Very small left-sided nodules measuring 6 mm or less.  Isthmus  Thickness: 3 mm.  No nodules visualized.  Lymphadenopathy  None visualized.  IMPRESSION: Bilateral nodules. Dominant complex right lower pole nodule measures 2.8 cm. Findings meet consensus criteria for biopsy. Ultrasound-guided fine needle aspiration should be considered, as per Eddie consensus statement: Management of Thyroid Nodules Detected at [place]: Society of Radiologists in  Eddie. Radiology 2005; [id number].   Electronically Signed   By: Marybelle Killings M.D.   On: 04/28/2014 15:38      ASSESSMENT AND PLAN: Mr. Everette Mall is an 81 year old gentleman who is status post PCI 3 in 2009.  Eddie [name] has developed severe aortic valve stenosis with a mean gradient of 44 mm and peak gradient of 70 mm.  Eddie [name] has a history of paroxysmal atrial fibrillation but has been maintaining sinus rhythm.  Eddie [name] current dose of sotalol is very low dose at 40 mg, which essentially is only a beta blocker dose.  However, I'm hasn't to further increased distances.  QTc interval is already 477 ms.  I am concerned about Eddie [name] fall risk.  Eddie [name] has experienced 2 recent falls and one in December lead to significant rib fractures and hemopneumothorax.  Eddie [name] has had issues with orthostatic hypotension.  Eddie [name] is maintaining sinus rhythm.  In Eddie office today.  Eddie [name] is blood pressure was better, but Eddie [name] was still orthostatic with a supine systolic blood pressure of 137 did decrease to 102 with standing.  There also was a heart rate increased from 61 to 91 bpm.  I have recommended continuance of sotalol and hope to reduce potential recurrent AF.  I do long discussion with both Eddie [name], Eddie [name] wife and daughter in Eddie office today.  Discussed Eddie risks and benefits of staying on or off anticoagulation therapy.  With Eddie [name] fall risk, I feel it is prudent to discontinue per DEXA, continue Eddie [name] current dose of sotalol, and aspirin alone therapy.  After much discussion concerning potential future recommendations with reference to Eddie [name] aortic valve, at present Eddie plan seems to be that of a conservative approach rather than consideration for future surgery.  I have recommended support stockings with at least 20-30 mm pressure.  I'm scheduling Eddie [name] for a CardioNet monitor to assess potential for recurrent AF or other rhythm issues.   A complete set of laboratory will  be obtained in Eddie fasting state.  I will see Eddie [name] in  6-8 weeks for follow-up evaluation.  Time spent: 30 minutes     Eddie Sine, MD, [facility]  05/14/2014 3:40 PM

## 2014-06-24 ENCOUNTER — Ambulatory Visit: Payer: Medicare Other | Admitting: Cardiovascular Disease

## 2014-06-26 ENCOUNTER — Encounter: Payer: Medicare Other | Admitting: Cardiology

## 2014-06-30 ENCOUNTER — Telehealth: Payer: Self-pay | Admitting: Cardiovascular Disease

## 2014-06-30 NOTE — Telephone Encounter (Signed)
SPOKE TO EBONY - PREPPING FOR OFFICE VISIT INFORMED EBONY - RN HAD TO CONTACT CARDIONET  END OF SUMMARY WILL BE POSTED AND FAXED TO( [email or web address]), EBONY IS AWARE.

## 2014-06-30 NOTE — Telephone Encounter (Signed)
Eddie [name] is calling about a monitor that was placed on the pt on 2/23. She say that it is still in process and she was requesting to get the monitor back. Please f/u with her  Thanks

## 2014-06-30 NOTE — Telephone Encounter (Signed)
FAXED END OF SUMMARY REPORT  EBONY IS AWARE

## 2014-07-01 ENCOUNTER — Ambulatory Visit (INDEPENDENT_AMBULATORY_CARE_PROVIDER_SITE_OTHER): Payer: Medicare Other | Admitting: Cardiology

## 2014-07-01 ENCOUNTER — Encounter: Payer: Self-pay | Admitting: Cardiology

## 2014-07-01 VITALS — BP 164/82 | HR 61 | Ht 72.0 in | Wt 146.0 lb

## 2014-07-01 DIAGNOSIS — I350 Nonrheumatic aortic (valve) stenosis: Secondary | ICD-10-CM | POA: Diagnosis not present

## 2014-07-01 DIAGNOSIS — I951 Orthostatic hypotension: Secondary | ICD-10-CM

## 2014-07-01 DIAGNOSIS — E785 Hyperlipidemia, unspecified: Secondary | ICD-10-CM

## 2014-07-01 DIAGNOSIS — F0390 Unspecified dementia without behavioral disturbance: Secondary | ICD-10-CM | POA: Diagnosis not present

## 2014-07-01 DIAGNOSIS — I480 Paroxysmal atrial fibrillation: Secondary | ICD-10-CM

## 2014-07-01 NOTE — Progress Notes (Signed)
Cardiology Office Note   Date:  07/01/2014   ID:  Eddie [name], DOB 10/10/1932, MRN [id number]  PCP:  Mathews Argyle, MD  Cardiologist:  Dr. Claiborne Billings       History of Present Illness: Eddie [name] is a 81 y.o. male who presents for follow-up of monitor. He was last seen by Dr. Claiborne Billings on 05/14/14. This was hospital follow-up for syncope. In review of Dr. Lucy Chris note, he has a history of coronary artery disease, PCI with 3 stent implantation in [place] in 2009. Peripheral vascular disease as well with left carotid endarterectomy. He moved to [place] from [place]. He has a history of paroxysmal atrial fibrillation as well as aortic stenosis. In December 2015 he experienced syncope while on per the axilla and had fractured ribs and developed hemothorax requiring chest tube insertion. He was readmitted a few weeks ago after significant fall at home. Blood pressure dropped to 66. Aortic stenosis was noted with mean gradient of 44 mmHg consistent with severe aortic stenosis. It was thought that his presyncope/syncope was most likely related to a combination of aortic valve stenosis as well as significant orthostatic hypotension. Dr. Claiborne Billings had lengthy discussion with his wife, daughter, family. Aortic valve replacement including TAVR therapy was discussed. He does however have significant dementia and has been on Aricept and Namenda. He also been on low-dose sotalol 40 mg twice a day without recurrence of atrial fibrillation. QTC was 477 ms.  Carotid artery disease 40-59% right ICA stenosis, 39 on left.  He was sent home on Pradaxa 150 mg twice a day.  Dr. Claiborne Billings had significant concerns about fall risks especially with 2 falls in December 1 leading to hemothorax. Issues noted with orthostatic hypotension. Prior office visit showed systolic of 836 down to 629 with standing. Heart rate also increased from 61-91. He had lengthy discussion about risks and benefits of anticoagulation  therapy. He felt as though it was prudent to discontinue anticoagulation and continue low-dose of sotalol and aspirin alone. He also had lengthy discussion regarding recommendations with aortic valve and plan seems to be that of conservative approach rather than consideration of future surgery. Support stockings were recommended. Monitor was also begun.  In review of reason for visit, he is seeing me today per [facility].  CardioNet monitor was placed from 05/13/14 through 06/11/14. There are occasional symptoms of dizziness however this correlated with sinus rhythm/mild sinus bradycardia. He has had occasional sinus bradycardia at night in the 30s, asymptomatic. He did have a few episodes of 4 beats of nonsustained ventricular tachycardia mostly in the early morning hours. No sustained episodes. He also had one episode of what looks like paroxysmal atrial tachycardia at heart rate of 120 bpm for proximally 15 beats. Asymptomatic. NO AFIB.     Past Medical History  Diagnosis Date  . A-fib   . Dementia   . HTN (hypertension)   . CAD (coronary artery disease)     s/p PCI x 3 (09/2007)  . Carotid artery stenosis     left  . Sciatica   . Pneumothorax 02/18/2014  . Thrombocytopenia   . Thyroid mass   . Memory loss   . Syncope and collapse   . Vision abnormalities     Past Surgical History  Procedure Laterality Date  . Coronary stent placement  2009  . Hernia repair    . Carotid endarterectomy    . Cataract extraction    . Chest tube insertion Right 02/18/2014  . Inguinal  hernia repair    . Tonsillectomy    . Coronary angioplasty with stent placement    . Achilles tendon repair Left      Current Outpatient Prescriptions  Medication Sig Dispense Refill  . acetaminophen (TYLENOL) 500 MG tablet Take 500 mg by mouth every 6 (six) hours as needed for mild pain.    Marland Kitchen aspirin EC 81 MG tablet Take 81 mg by mouth daily.    Marland Kitchen atorvastatin (LIPITOR) 80 MG tablet Take 80 mg by mouth daily.    Marland Kitchen b  complex vitamins tablet Take 1 tablet by mouth daily.    . cholecalciferol (VITAMIN D) 1000 UNITS tablet Take 1,000 Units by mouth daily.    Marland Kitchen donepezil (ARICEPT) 10 MG tablet Take 1 tablet (10 mg total) by mouth daily. 30 tablet 11  . ezetimibe (ZETIA) 10 MG tablet Take 10 mg by mouth daily.    . feeding supplement, ENSURE COMPLETE, (ENSURE COMPLETE) LIQD Take 237 mLs by mouth 2 (two) times daily between meals.    . fluticasone (FLONASE) 50 MCG/ACT nasal spray Place 2 sprays into both nostrils daily.    Marland Kitchen gabapentin (NEURONTIN) 400 MG capsule Take 400 mg by mouth daily.    . magnesium oxide (MAG-OX) 400 MG tablet Take 400 mg by mouth daily.    . memantine (NAMENDA XR) 28 MG CP24 24 hr capsule Take 28 mg by mouth daily.    . metroNIDAZOLE (METROGEL) 1 % gel Apply 1 application topically 2 (two) times daily as needed (rosacea). To face    . sotalol (BETAPACE) 80 MG tablet Take 40 mg by mouth 2 (two) times daily.     No current facility-administered medications for this visit.    Allergies:   Review of patient's allergies indicates no known allergies.    Social History:  The patient  reports that he has never smoked. He has never used smokeless tobacco. He reports that he drinks alcohol. He reports that he does not use illicit drugs.   Family History:  The patient's family history includes Diabetes in his father; Heart disease in his mother.    ROS:  Please see the history of present illness.   Otherwise, review of systems are positive for falls, dementia, feeling cold (TSH was normal in hospital)..   All other systems are reviewed and negative.    PHYSICAL EXAM: VS:  BP 164/82 mmHg  Pulse 61  Ht 6' (1.829 m)  Wt 146 lb (66.225 kg)  BMI 19.80 kg/m2 , BMI Body mass index is 19.8 kg/(m^2). GEN: Thin, elderly, in no acute distress HEENT: normal Neck: no JVD, + bialt carotid bruits/ radiation of aortic murmur, no masses Cardiac: RRR; 3/6 musical systolic murmur,no  rubs, or gallops,no  edema  Respiratory:  clear to auscultation bilaterally, normal work of breathing GI: soft, nontender, nondistended, + BS MS: left hand deformity  Skin: warm and dry, no rash Neuro:  MAE, mildly frail. Ambulates slowly.  Psych: euthymic mood, dementia noted.    EKG:  EKG is not ordered today. The ekg ordered today demonstrates none today   Recent Labs: 02/18/2014: TSH 0.924 02/23/2014: Magnesium 2.0 04/30/2014: ALT 17 05/01/2014: BUN 9; Creatinine 0.73; Hemoglobin 10.5*; Platelets 167; Potassium 4.0; Sodium 139    Lipid Panel No results found for: CHOL, TRIG, HDL, CHOLHDL, VLDL, LDLCALC, LDLDIRECT    Wt Readings from Last 3 Encounters:  07/01/14 146 lb (66.225 kg)  05/12/14 138 lb 3.2 oz (62.687 kg)  05/01/14 135 lb 2.3 oz (  61.3 kg)      Other studies Reviewed: Additional studies/ records that were reviewed today include: Prior records, hospital, labs. Review of the above records demonstrates: as above.   ASSESSMENT AND PLAN:  1.  Aortic stenosis-severe. Agree with conservative management giving other comorbidities, dementia. Family is in agreement. Discussed possibilities of worsening heart failure, arrhythmic episode.  2. Paroxysmal atrial fibrillation-no evidence of atrial fibrillation on 30 day event monitor. Brief PAT. We will continue with low-dose sotalol. Hopefully this will keep atrial fibrillation in check. They understand risk of potential embolic stroke if atrial fibrillation occurs.  3. Nonsustained ventricular tachycardia-2 separate episodes, 4 beats each of nonsustained ventricle tachycardia was noted on event monitor. Continue with current management strategy. Conservative.  4. Orthostatic hypotension-compression hose. Saw liberalization. Hydration. Agree with trying to maintain higher than normal blood pressure. They provided me with several readings from home most of which are demonstrating quite significant orthostatic hypotension. Be careful especially when  getting out of bed. We discussed this. Falls are the main reason why we are no longer on anticoagulation. Lengthy discussion again today.  5. Dementia-Namenda, Aricept. Fairly advanced. They have 24-hour help at home. They live in apartments next to her's teeter on [address]. Avoid outdoor steps.  6. Hyperlipidemia-I decreased atorvastatin from 80 down to 40 mg. Continue with Zetia for now. Dr. Felipa Eth will likely be checking lipid profile. If LDL is under reasonable control, I would consider discontinuation of Zetia.    Current medicines are reviewed at length with the patient today.  The patient has concerns regarding medicines.  The following changes have been made:  Decreasing atorvastatin.  Labs/ tests ordered today include: none  No orders of the defined types were placed in this encounter.     Disposition:   FU with Mamie Hundertmark in 3 months   Signed, Candee Furbish, MD  07/01/2014 11:42 AM    [facility] [address], [place], [place]  [address] Phone: [phone]; Fax: [phone]

## 2014-07-01 NOTE — Patient Instructions (Signed)
**Note De-identified  Obfuscation** Your physician recommends that you continue on your current medications as directed. Please refer to the Current Medication list given to you today.  Your physician recommends that you schedule a follow-up appointment in: 3 months  

## 2014-07-13 ENCOUNTER — Ambulatory Visit: Payer: Medicare Other | Admitting: Cardiovascular Disease

## 2014-07-15 DIAGNOSIS — G301 Alzheimer's disease with late onset: Secondary | ICD-10-CM | POA: Diagnosis not present

## 2014-07-15 DIAGNOSIS — I480 Paroxysmal atrial fibrillation: Secondary | ICD-10-CM | POA: Diagnosis not present

## 2014-07-15 DIAGNOSIS — E119 Type 2 diabetes mellitus without complications: Secondary | ICD-10-CM | POA: Diagnosis not present

## 2014-07-15 DIAGNOSIS — E780 Pure hypercholesterolemia: Secondary | ICD-10-CM | POA: Diagnosis not present

## 2014-07-15 DIAGNOSIS — D539 Nutritional anemia, unspecified: Secondary | ICD-10-CM | POA: Diagnosis not present

## 2014-07-15 DIAGNOSIS — I10 Essential (primary) hypertension: Secondary | ICD-10-CM | POA: Diagnosis not present

## 2014-07-15 DIAGNOSIS — Z79899 Other long term (current) drug therapy: Secondary | ICD-10-CM | POA: Diagnosis not present

## 2014-07-22 DIAGNOSIS — H04121 Dry eye syndrome of right lacrimal gland: Secondary | ICD-10-CM | POA: Diagnosis not present

## 2014-07-24 NOTE — Progress Notes (Signed)
Patient had appointment with Dr.Skains on 06/26/14. This was addressed at that appointment.

## 2014-07-31 ENCOUNTER — Encounter: Payer: Self-pay | Admitting: Neurology

## 2014-08-11 DIAGNOSIS — I10 Essential (primary) hypertension: Secondary | ICD-10-CM | POA: Diagnosis not present

## 2014-08-11 DIAGNOSIS — D509 Iron deficiency anemia, unspecified: Secondary | ICD-10-CM | POA: Diagnosis not present

## 2014-08-11 DIAGNOSIS — G301 Alzheimer's disease with late onset: Secondary | ICD-10-CM | POA: Diagnosis not present

## 2014-08-11 DIAGNOSIS — J300 Vasomotor rhinitis: Secondary | ICD-10-CM | POA: Diagnosis not present

## 2014-09-24 ENCOUNTER — Encounter: Payer: Self-pay | Admitting: Neurology

## 2014-09-30 ENCOUNTER — Ambulatory Visit: Payer: Medicare Other | Admitting: Neurology

## 2014-10-01 ENCOUNTER — Encounter: Payer: Self-pay | Admitting: Cardiology

## 2014-10-01 ENCOUNTER — Ambulatory Visit (INDEPENDENT_AMBULATORY_CARE_PROVIDER_SITE_OTHER): Payer: Medicare Other | Admitting: Cardiology

## 2014-10-01 VITALS — BP 122/66 | HR 69

## 2014-10-01 DIAGNOSIS — E785 Hyperlipidemia, unspecified: Secondary | ICD-10-CM | POA: Diagnosis not present

## 2014-10-01 DIAGNOSIS — I480 Paroxysmal atrial fibrillation: Secondary | ICD-10-CM

## 2014-10-01 DIAGNOSIS — I951 Orthostatic hypotension: Secondary | ICD-10-CM

## 2014-10-01 NOTE — Patient Instructions (Signed)
Medication Instructions:  Your physician recommends that you continue on your current medications as directed. Please refer to the Current Medication list given to you today.  Follow-Up: Follow up in 4 months with Dr. Marlou Porch.  You will receive a letter in the mail 2 months before you are due.  Please call us when you receive this letter to schedule your follow up appointment.  Thank you for choosing [facility]!!

## 2014-10-01 NOTE — Progress Notes (Signed)
Cardiology Office Note   Date:  10/01/2014   ID:  Eddie [name], DOB 1932-06-06, MRN [id number]  PCP:  Eddie Argyle, MD  Cardiologist:  Dr. Claiborne Billings       History of Present Illness: Eddie [name] is a 81 y.o. male who presents for follow-up of monitor. He was last seen by Dr. Claiborne Billings on 05/14/14. This was hospital follow-up for syncope. In review of Dr. Lucy [name] note, he has a history of coronary artery disease, PCI with 3 stent implantation in [place] in 2009. Peripheral vascular disease as well with left carotid endarterectomy. He moved to [place] from [place]. He has a history of paroxysmal atrial fibrillation as well as aortic stenosis. In December 2015 he experienced syncope while on per the axilla and had fractured ribs and developed hemothorax requiring chest tube insertion. He was readmitted a few weeks ago after significant fall at home. Blood pressure dropped to 66. Aortic stenosis was noted with mean gradient of 44 mmHg consistent with severe aortic stenosis. It was thought that his presyncope/syncope was most likely related to a combination of aortic valve stenosis as well as significant orthostatic hypotension. Dr. Claiborne Billings had lengthy discussion with his wife, daughter, family. Aortic valve replacement including TAVR therapy was discussed. He does however have significant dementia and has been on Aricept and Namenda. He also been on low-dose sotalol 40 mg twice a day without recurrence of atrial fibrillation. QTC was 477 ms.  Carotid artery disease 40-59% right ICA stenosis, 39 on left.  He was sent home on Pradaxa 150 mg twice a day.  Dr. Claiborne Billings had significant concerns about fall risks especially with 2 falls in December 1 leading to hemothorax. Issues noted with orthostatic hypotension. Prior office visit showed systolic of 557 down to 322 with standing. Heart rate also increased from 61-91. He had lengthy discussion about risks and benefits of anticoagulation  therapy. He felt as though it was prudent to discontinue anticoagulation and continue low-dose of sotalol and aspirin alone. He also had lengthy discussion regarding recommendations with aortic valve and plan seems to be that of conservative approach rather than consideration of future surgery. Support stockings were recommended. Monitor was also begun.  CardioNet monitor was placed from 05/13/14 through 06/11/14. There are occasional symptoms of dizziness however this correlated with sinus rhythm/mild sinus bradycardia. He has had occasional sinus bradycardia at night in the 30s, asymptomatic. He did have a few episodes of 4 beats of nonsustained ventricular tachycardia mostly in the early morning hours. No sustained episodes. He also had one episode of what looks like paroxysmal atrial tachycardia at heart rate of 120 bpm for proximally 15 beats. Asymptomatic. NO AFIB.   10/01/14-overall doing well with no change in symptoms. Tolerating his severe aortic stenosis. No signs of heart failure. No syncope. Eddie [name], his daughter who works for [employer].    Past Medical History  Diagnosis Date  . A-fib   . Dementia   . HTN (hypertension)   . CAD (coronary artery disease)     s/p PCI x 3 (09/2007)  . Carotid artery stenosis     left  . Sciatica   . Pneumothorax 02/18/2014  . Thrombocytopenia   . Thyroid mass   . Memory loss   . Syncope and collapse   . Vision abnormalities     Past Surgical History  Procedure Laterality Date  . Coronary stent placement  2009  . Hernia repair    . Carotid endarterectomy    .  Cataract extraction    . Chest tube insertion Right 02/18/2014  . Inguinal hernia repair    . Tonsillectomy    . Coronary angioplasty with stent placement    . Achilles tendon repair Left      Current Outpatient Prescriptions  Medication Sig Dispense Refill  . acetaminophen (TYLENOL) 500 MG tablet Take 500 mg by mouth every 6 (six) hours as needed for mild pain.    Marland Kitchen aspirin EC 81  MG tablet Take 81 mg by mouth daily.    Marland Kitchen atorvastatin (LIPITOR) 80 MG tablet Take 40 mg by mouth daily.    Marland Kitchen b complex vitamins tablet Take 1 tablet by mouth daily.    . cholecalciferol (VITAMIN D) 1000 UNITS tablet Take 1,000 Units by mouth daily.    Marland Kitchen ezetimibe (ZETIA) 10 MG tablet Take 10 mg by mouth daily.    . feeding supplement, ENSURE COMPLETE, (ENSURE COMPLETE) LIQD Take 237 mLs by mouth 2 (two) times daily between meals.    . fluticasone (FLONASE) 50 MCG/ACT nasal spray Place 2 sprays into both nostrils as needed.     . gabapentin (NEURONTIN) 400 MG capsule Take 400 mg by mouth daily.    Marland Kitchen ipratropium (ATROVENT) 0.06 % nasal spray     . magnesium oxide (MAG-OX) 400 MG tablet Take 400 mg by mouth daily.    . metroNIDAZOLE (METROGEL) 1 % gel Apply 1 application topically 2 (two) times daily as needed (rosacea). To face    . NAMZARIC 28-10 MG CP24     . sotalol (BETAPACE) 80 MG tablet Take 40 mg by mouth 2 (two) times daily.     No current facility-administered medications for this visit.    Allergies:   Review of patient's allergies indicates no known allergies.    Social History:  The patient  reports that he has never smoked. He has never used smokeless tobacco. He reports that he drinks alcohol. He reports that he does not use illicit drugs.   Family History:  The patient's family history includes Diabetes in his father; Heart disease in his mother.    ROS:  Please see the history of present illness.   Otherwise, review of systems are positive for falls, dementia, feeling cold (TSH was normal in hospital)..   All other systems are reviewed and negative.    PHYSICAL EXAM: VS:  BP 122/66 mmHg  Pulse 69  SpO2 97% , BMI There is no weight on file to calculate BMI. GEN: Thin, elderly, in no acute distress HEENT: normal Neck: no JVD, + bialt carotid bruits/ radiation of aortic murmur, no masses Cardiac: RRR; 3/6 musical systolic murmur,no  rubs, or gallops,no edema    Respiratory:  clear to auscultation bilaterally, normal work of breathing GI: soft, nontender, nondistended, + BS MS: left hand deformity  Skin: warm and dry, no rash Neuro:  MAE, mildly frail. Ambulates slowly.  Psych: euthymic mood, dementia noted.    EKG:  EKG is not ordered today. The ekg ordered today demonstrates none today   Recent Labs: 02/18/2014: TSH 0.924 02/23/2014: Magnesium 2.0 04/30/2014: ALT 17 05/01/2014: BUN 9; Creatinine, Ser 0.73; Hemoglobin 10.5*; Platelets 167; Potassium 4.0; Sodium 139    Lipid Panel No results found for: CHOL, TRIG, HDL, CHOLHDL, VLDL, LDLCALC, LDLDIRECT    Wt Readings from Last 3 Encounters:  07/01/14 146 lb (66.225 kg)  05/12/14 138 lb 3.2 oz (62.687 kg)  05/01/14 135 lb 2.3 oz (61.3 kg)      Other studies  Reviewed: Additional studies/ records that were reviewed today include: Prior records, hospital, labs. Review of the above records demonstrates: as above.   ASSESSMENT AND PLAN:  1.  Aortic stenosis-severe. Agree with conservative management giving other comorbidities, dementia. Family is in agreement. Discussed possibilities of worsening heart failure, arrhythmic episode. No significant change in symptoms.  2. Paroxysmal atrial fibrillation-no evidence of atrial fibrillation on 30 day event monitor. Brief PAT. We will continue with low-dose sotalol. Hopefully this will keep atrial fibrillation in check. They understand risk of potential embolic stroke if atrial fibrillation occurs. Only on aspirin.  3. Nonsustained ventricular tachycardia-2 separate episodes, 4 beats each of nonsustained ventricle tachycardia was noted on event monitor. Continue with current management strategy. Conservative.  4. Orthostatic hypotension-compression hose. Saw liberalization. Hydration. Agree with trying to maintain higher than normal blood pressure. They provided me with several readings from home most of which are demonstrating quite significant  orthostatic hypotension. Be careful especially when getting out of bed. We discussed this. Falls are the main reason why we are no longer on anticoagulation. Lengthy discussion again today. He is wearing compression hose  5. Dementia-Namenda, Aricept. Fairly advanced. They have 24-hour help at home. They live in apartments next to her's teeter on [address]. Avoid outdoor steps.   6. Hyperlipidemia-I decreased atorvastatin from 80 down to 40 mg. Continue with Zetia for now. Dr. Felipa Eth will likely be checking lipid profile. If LDL is under reasonable control, I would consider discontinuation of Zetia.    Current medicines are reviewed at length with the patient today.  The patient has concerns regarding medicines.  The following changes have been made:  none  Labs/ tests ordered today include: none  No orders of the defined types were placed in this encounter.     Disposition:   FU with Skains in 4 months   Signed, Candee Furbish, MD  10/01/2014 10:23 AM    [facility] [place], [place], [place]  [address] Phone: [phone]; Fax: [phone]

## 2014-10-12 DIAGNOSIS — D649 Anemia, unspecified: Secondary | ICD-10-CM | POA: Diagnosis not present

## 2014-12-03 DIAGNOSIS — H6121 Impacted cerumen, right ear: Secondary | ICD-10-CM | POA: Diagnosis not present

## 2014-12-03 DIAGNOSIS — F320 Major depressive disorder, single episode, mild: Secondary | ICD-10-CM | POA: Diagnosis not present

## 2015-01-11 DIAGNOSIS — I4891 Unspecified atrial fibrillation: Secondary | ICD-10-CM | POA: Diagnosis not present

## 2015-01-11 DIAGNOSIS — Z0000 Encounter for general adult medical examination without abnormal findings: Secondary | ICD-10-CM | POA: Diagnosis not present

## 2015-01-11 DIAGNOSIS — Z23 Encounter for immunization: Secondary | ICD-10-CM | POA: Diagnosis not present

## 2015-01-11 DIAGNOSIS — D692 Other nonthrombocytopenic purpura: Secondary | ICD-10-CM | POA: Diagnosis not present

## 2015-01-11 DIAGNOSIS — E119 Type 2 diabetes mellitus without complications: Secondary | ICD-10-CM | POA: Diagnosis not present

## 2015-01-11 DIAGNOSIS — R05 Cough: Secondary | ICD-10-CM | POA: Diagnosis not present

## 2015-01-11 DIAGNOSIS — I10 Essential (primary) hypertension: Secondary | ICD-10-CM | POA: Diagnosis not present

## 2015-01-11 DIAGNOSIS — G301 Alzheimer's disease with late onset: Secondary | ICD-10-CM | POA: Diagnosis not present

## 2015-02-04 ENCOUNTER — Ambulatory Visit
Admission: RE | Admit: 2015-02-04 | Discharge: 2015-02-04 | Disposition: A | Discharge status: Home / Self Care | Payer: Medicare Other | Source: Ambulatory Visit | Attending: Internal Medicine | Admitting: Internal Medicine

## 2015-02-04 ENCOUNTER — Other Ambulatory Visit: Payer: Self-pay | Admitting: Internal Medicine

## 2015-02-04 DIAGNOSIS — R05 Cough: Secondary | ICD-10-CM

## 2015-02-04 DIAGNOSIS — R059 Cough, unspecified: Secondary | ICD-10-CM

## 2015-02-15 ENCOUNTER — Ambulatory Visit: Payer: Medicare Other | Admitting: Cardiology

## 2015-03-28 ENCOUNTER — Encounter: Payer: Self-pay | Admitting: Cardiology

## 2015-03-29 ENCOUNTER — Ambulatory Visit: Payer: Medicare Other | Admitting: Cardiology

## 2015-03-31 NOTE — Progress Notes (Signed)
Cardiology Office Note    Date:  04/01/2015   ID:  Eddie [name], DOB [birth year], MRN [id number]  PCP:  Eddie Argyle, MD  Cardiologist:  Dr. Candee [name]   Electrophysiologist:  n/a  Chief Complaint  Patient presents with  . Follow-up  . Aortic Stenosis    History of Present Illness:  Eddie [name] is a 82 y.o. male with a hx of CAD status post PCI with stent 3 performed in [place], [place] 2009, carotid stenosis status post L CEA, PAF, aortic stenosis, dementia. He moved [place] from [place]. He was admitted to [facility] in 12/15 syncope. Echo demonstrated severe aortic stenosis. He also was noted to have orthostatic hypotension.  He was maintained on sotalol. Patient had significant history of falls with prior hemothorax from trauma. Risks of anticoagulation were felt to be greater than benefits. He was taken off of anticoagulation. An event monitor in 2/16 demonstrated sinus rhythm and sinus bradycardia with occasional episodes of heart rates in the 30s at night and a few beats of brief NSVT. There was one episode of what appeared to be PAT. There was no atrial fibrillation. Last seen by Dr. Marlou [name] 7/16.  Conservative management has been recommended for his aortic stenosis.  Returns for follow-up. Here today with his wife and his niece. He has been doing fairly well. Denies chest pain, significant dyspnea, syncope. Denies orthopnea, PND or edema. He has had a couple of falls since he was here last. He was trying to get his clothes on was sitting on the edge of the bed. He had no significant trauma.   Past Medical History  Diagnosis Date  . A-fib ([facility])   . Dementia   . HTN (hypertension)   . CAD (coronary artery disease)     s/p PCI x 3 (09/2007)  . Carotid artery stenosis     left  . Sciatica   . Pneumothorax 02/18/2014  . Thrombocytopenia ([facility])   . Thyroid mass   . Memory loss   . Syncope and collapse   . Vision abnormalities     Past Surgical History  Procedure  Laterality Date  . Coronary stent placement  2009  . Hernia repair    . Carotid endarterectomy    . Cataract extraction    . Chest tube insertion Right 02/18/2014  . Inguinal hernia repair    . Tonsillectomy    . Coronary angioplasty with stent placement    . Achilles tendon repair Left     Current Medications: Previous Medications   ACETAMINOPHEN (TYLENOL) 500 MG TABLET    Take 500 mg by mouth every 6 (six) hours as needed for mild pain.   ASPIRIN EC 81 MG TABLET    Take 81 mg by mouth daily.   ATORVASTATIN (LIPITOR) 80 MG TABLET    Take 40 mg by mouth daily.   B COMPLEX VITAMINS TABLET    Take 1 tablet by mouth daily.   CHOLECALCIFEROL (VITAMIN D) 1000 UNITS TABLET    Take 1,000 Units by mouth daily.   EZETIMIBE (ZETIA) 10 MG TABLET    Take 10 mg by mouth daily.   FEEDING SUPPLEMENT, ENSURE COMPLETE, (ENSURE COMPLETE) LIQD    Take 237 mLs by mouth 2 (two) times daily between meals.   FLUTICASONE (FLONASE) 50 MCG/ACT NASAL SPRAY    Place 2 sprays into both nostrils as needed.    GABAPENTIN (NEURONTIN) 400 MG CAPSULE    Take 400 mg by mouth daily.   IPRATROPIUM (ATROVENT)  0.06 % NASAL SPRAY    Place 2 sprays into both nostrils 3 (three) times daily.    MAGNESIUM OXIDE (MAG-OX) 400 MG TABLET    Take 400 mg by mouth daily.   METRONIDAZOLE (METROGEL) 1 % GEL    Apply 1 application topically 2 (two) times daily as needed (rosacea). To face   NAMZARIC 28-10 MG CP24    Take 28 mg by mouth daily.    SERTRALINE (ZOLOFT) 25 MG TABLET    Take 25 mg by mouth daily.    SOTALOL (BETAPACE) 80 MG TABLET    Take 40 mg by mouth 2 (two) times daily.      Allergies:   Review of patient's allergies indicates no known allergies.   Social History   Social History  . Marital Status: Married    Spouse Name: N/A  . Number of Children: N/A  . Years of Education: N/A   Social History Main Topics  . Smoking status: Never Smoker   . Smokeless tobacco: Never Used  . Alcohol Use: Yes     Comment:  occasionally   . Drug Use: No  . Sexual Activity: Not Asked   Other Topics Concern  . None   Social History Narrative   Married   No children but nieces and nephews and friends    Recently moved to [facility] 01/2014 to be closer to above    Denies h/o smoking, caffeine    Lives at home with wife at [employer]    No PCP possibly interested in Dr. Hartley [name].      Family History:  The patient's family history includes Diabetes in his father; Heart disease in his mother.   ROS:   Please see the history of present illness.    Review of Systems  Constitution: Positive for decreased appetite.  Neurological: Positive for loss of balance.  All other systems reviewed and are negative.   PHYSICAL EXAM:   VS:  BP 132/60 mmHg  Pulse 52  Ht 6' (1.829 m)  Wt 147 lb (66.679 kg)  BMI 19.93 kg/m2   GEN: Well nourished, well developed, in no acute distress HEENT: normal Neck: no JVD, no masses Cardiac: Normal S1/S2, RRR; 2/6 harsh crescendo-decrescendo systolic murmur RUSB Respiratory:  clear to auscultation bilaterally; no wheezing, rhonchi or rales GI: soft, nontender, nondistended   MS: congenital L hand deformity noted Skin: warm and dry, no rash Neuro:   no focal deficits  Psych: Alert and oriented x 3, normal affect  Wt Readings from Last 3 Encounters:  04/01/15 147 lb (66.679 kg)  07/01/14 146 lb (66.225 kg)  05/12/14 138 lb 3.2 oz (62.687 kg)      Studies/Labs Reviewed:   EKG:  EKG is  ordered today.  The ekg ordered today demonstrates sinus brady, HR 51, normal axis, inc RBBB, QTc 464 ms, no significant change since prior tracing   Recent Labs: 04/30/2014: ALT 17 05/01/2014: BUN 9; Creatinine, Ser 0.73; Hemoglobin 10.5*; Platelets 167; Potassium 4.0; Sodium 139   Recent Lipid Panel No results found for: CHOL, TRIG, HDL, CHOLHDL, VLDL, LDLCALC, LDLDIRECT  Additional studies/ records that were reviewed today include:   Event Monitor 3/16 NSR, several  episodes of marked bradycardia, episode of PSVT and 2 episodes of 4 beats NSVT  Carotid US 2/16 R 40-59% ICA L 1-39% ICA  Echo 12/15 Mild LVH, EF [id number], grade 1 diastolic dysfunction, severe AS (peak 70 mmHg, mean 44 mmHg), mild AI, mild MR, mild LAE,  PASP 41 mmHg    ASSESSMENT:    1. Aortic stenosis   2. Paroxysmal atrial fibrillation (HCC)   3. Coronary artery disease involving native coronary artery of native heart without angina pectoris   4. Essential hypertension   5. Hyperlipidemia   6. Carotid stenosis, bilateral     PLAN:  In order of problems listed above:  1. Severe AS - No red flag symptoms. Conservative management is continued.   2. PAF - Maintaining NSR on Sotalol.  QTc is ok.  He is no longer on anticoagulation secondary to fall risk.  CHADS2-VASc=4.    3. CAD - No angina.  Continue ASA, statin.    4. HTN - Controlled. He has a hx of orthostatic intolerance.  This has been stable.    5. HL - Continue statin.  Will request labs from PCP. If LDL ok, consider DC Zetia.  6. Carotid stenosis - s/p prior CEA.  Continue ASA, statin.     Medication Adjustments/Labs and Tests Ordered: Current medicines are reviewed at length with the patient today.  Concerns regarding medicines are outlined above.  Medication changes, Labs and Tests ordered today are outlined in the Patient Instructions noted below.  Signed, Richardson Dopp, PA-C  04/01/2015 5:49 PM    [facility] [place], [place], [place]  [address] Phone: [phone]; Fax: [phone]   Patient Instructions  Medication Instructions:   Your physician recommends that you continue on your current medications as directed. Please refer to the Current Medication list given to you today.'  If you need a refill on your cardiac medications before your next appointment, please call your pharmacy.  Labwork:  .NONE ORDER TODAY   Testing/Procedures: NONE ORDER TODAY   Follow-Up: IN  4 TO 5 MONTHS WITH DR Eddie [name]    Any Other Special Instructions Will Be Listed Below (If Applicable).

## 2015-04-01 ENCOUNTER — Encounter: Payer: Self-pay | Admitting: Physician Assistant

## 2015-04-01 ENCOUNTER — Ambulatory Visit (INDEPENDENT_AMBULATORY_CARE_PROVIDER_SITE_OTHER): Payer: Medicare Other | Admitting: Physician Assistant

## 2015-04-01 VITALS — BP 132/60 | HR 52 | Ht 72.0 in | Wt 147.0 lb

## 2015-04-01 DIAGNOSIS — I10 Essential (primary) hypertension: Secondary | ICD-10-CM | POA: Diagnosis not present

## 2015-04-01 DIAGNOSIS — I6523 Occlusion and stenosis of bilateral carotid arteries: Secondary | ICD-10-CM

## 2015-04-01 DIAGNOSIS — I480 Paroxysmal atrial fibrillation: Secondary | ICD-10-CM | POA: Diagnosis not present

## 2015-04-01 DIAGNOSIS — I2510 Atherosclerotic heart disease of native coronary artery without angina pectoris: Secondary | ICD-10-CM | POA: Diagnosis not present

## 2015-04-01 DIAGNOSIS — I350 Nonrheumatic aortic (valve) stenosis: Secondary | ICD-10-CM | POA: Diagnosis not present

## 2015-04-01 DIAGNOSIS — E785 Hyperlipidemia, unspecified: Secondary | ICD-10-CM

## 2015-04-01 NOTE — Patient Instructions (Addendum)
Medication Instructions:   Your physician recommends that you continue on your current medications as directed. Please refer to the Current Medication list given to you today.'  If you need a refill on your cardiac medications before your next appointment, please call your pharmacy.  Labwork:  .NONE ORDER TODAY   Testing/Procedures: NONE ORDER TODAY   Follow-Up: IN 4 TO 5 MONTHS WITH DR Marlou Porch    Any Other Special Instructions Will Be Listed Below (If Applicable).

## 2015-04-16 ENCOUNTER — Telehealth: Payer: Self-pay | Admitting: Physician Assistant

## 2015-04-16 NOTE — Telephone Encounter (Signed)
I received records from his PCP. LDL in 4/16 was 65. He can stop taking Zetia. He should remain on Lipitor 40 mg QD. Richardson Dopp, PA-C   04/16/2015 1:28 PM

## 2015-04-19 ENCOUNTER — Encounter: Payer: Self-pay | Admitting: Physician Assistant

## 2015-04-19 NOTE — Telephone Encounter (Signed)
Pt gave permssion ok to s/w wife. Wife notified per Richardson Dopp, PA and Dr. Marlou Porch that pt can stop taking the Zetia though make sure to continue on the Lipitor. Wife states pt has 3 weeks left of zetia, UI asid ok to finish then stop. Wife said ok.

## 2015-04-19 NOTE — Addendum Note (Signed)
Addended by: Michae Kava on: 04/19/2015 10:07 AM   Modules accepted: Orders, Medications

## 2015-06-14 DIAGNOSIS — Z79899 Other long term (current) drug therapy: Secondary | ICD-10-CM | POA: Diagnosis not present

## 2015-06-14 DIAGNOSIS — D473 Essential (hemorrhagic) thrombocythemia: Secondary | ICD-10-CM | POA: Diagnosis not present

## 2015-06-14 DIAGNOSIS — I10 Essential (primary) hypertension: Secondary | ICD-10-CM | POA: Diagnosis not present

## 2015-06-14 DIAGNOSIS — F325 Major depressive disorder, single episode, in full remission: Secondary | ICD-10-CM | POA: Diagnosis not present

## 2015-06-14 DIAGNOSIS — I4891 Unspecified atrial fibrillation: Secondary | ICD-10-CM | POA: Diagnosis not present

## 2015-06-14 DIAGNOSIS — G301 Alzheimer's disease with late onset: Secondary | ICD-10-CM | POA: Diagnosis not present

## 2015-06-14 DIAGNOSIS — E119 Type 2 diabetes mellitus without complications: Secondary | ICD-10-CM | POA: Diagnosis not present

## 2015-06-14 DIAGNOSIS — E7800 Pure hypercholesterolemia, unspecified: Secondary | ICD-10-CM | POA: Diagnosis not present

## 2015-07-20 DIAGNOSIS — Z961 Presence of intraocular lens: Secondary | ICD-10-CM | POA: Diagnosis not present

## 2015-08-03 ENCOUNTER — Ambulatory Visit (INDEPENDENT_AMBULATORY_CARE_PROVIDER_SITE_OTHER): Payer: Medicare Other | Admitting: Cardiology

## 2015-08-03 ENCOUNTER — Encounter: Payer: Self-pay | Admitting: Cardiology

## 2015-08-03 VITALS — BP 120/80 | HR 66 | Ht 71.0 in | Wt 153.0 lb

## 2015-08-03 DIAGNOSIS — F0390 Unspecified dementia without behavioral disturbance: Secondary | ICD-10-CM | POA: Diagnosis not present

## 2015-08-03 DIAGNOSIS — I6523 Occlusion and stenosis of bilateral carotid arteries: Secondary | ICD-10-CM

## 2015-08-03 DIAGNOSIS — I10 Essential (primary) hypertension: Secondary | ICD-10-CM

## 2015-08-03 DIAGNOSIS — I480 Paroxysmal atrial fibrillation: Secondary | ICD-10-CM | POA: Diagnosis not present

## 2015-08-03 DIAGNOSIS — I350 Nonrheumatic aortic (valve) stenosis: Secondary | ICD-10-CM | POA: Diagnosis not present

## 2015-08-03 NOTE — Patient Instructions (Signed)

## 2015-08-03 NOTE — Progress Notes (Signed)
Cardiology Office Note   Date:  08/03/2015   ID:  Eddie [name], DOB November 01, 1932, MRN [id number]  PCP:  Mathews Argyle, MD  Cardiologist:  Dr. Marlou Porch (Former Dr. Claiborne Billings)       History of Present Illness: Eddie [name] is a 82 y.o. male who presents for follow-up. He was last seen by Dr. Claiborne Billings on 05/14/14. This was hospital follow-up for syncope. In review of Dr. Lucy Chris note, he has a history of coronary artery disease, PCI with 3 stent implantation in [place] in 2009. Peripheral vascular disease as well with left carotid endarterectomy. He moved to [place] from [place]. He has a history of paroxysmal atrial fibrillation as well as aortic stenosis. In December 2015 he experienced syncope while on per the axilla and had fractured ribs and developed hemothorax requiring chest tube insertion. He was readmitted a few weeks ago after significant fall at home. Blood pressure dropped to 66. Aortic stenosis was noted with mean gradient of 44 mmHg consistent with severe aortic stenosis. It was thought that his presyncope/syncope was most likely related to a combination of aortic valve stenosis as well as significant orthostatic hypotension. Dr. Claiborne Billings had lengthy discussion with his wife, daughter, family. Aortic valve replacement including TAVR therapy was discussed. He does however have significant dementia and has been on Aricept and Namenda. He also been on low-dose sotalol 40 mg twice a day without recurrence of atrial fibrillation. QTC was 477 ms.  Carotid artery disease 40-59% right ICA stenosis, 39 on left.  He was sent home on Pradaxa 150 mg twice a day.  Dr. Claiborne Billings had significant concerns about fall risks especially with 2 falls in December 1 leading to hemothorax. Issues noted with orthostatic hypotension. Prior office visit showed systolic of 0000000 down to [id number] with standing. Heart rate also increased from 61-91. He had lengthy discussion about risks and benefits of  anticoagulation therapy. He felt as though it was prudent to discontinue anticoagulation and continue low-dose of sotalol and aspirin alone. He also had lengthy discussion regarding recommendations with aortic valve and plan seems to be that of conservative approach rather than consideration of future surgery. Support stockings were recommended. Monitor was also begun.  CardioNet monitor was placed from 05/13/14 through 06/11/14. There are occasional symptoms of dizziness however this correlated with sinus rhythm/mild sinus bradycardia. He has had occasional sinus bradycardia at night in the 30s, asymptomatic. He did have a few episodes of 4 beats of nonsustained ventricular tachycardia mostly in the early morning hours. No sustained episodes. He also had one episode of what looks like paroxysmal atrial tachycardia at heart rate of 120 bpm for proximally 15 beats. Asymptomatic. NO AFIB.   10/01/14-overall doing well with no change in symptoms. Tolerating his severe aortic stenosis. No signs of heart failure. No syncope. Eddie [name], his daughter who works for [employer].  08/03/15-no change in symptoms. No heart failure, no syncope, no shortness of breath. Dementia is fairly advanced at this point. No recent falls. Here with his wife and caregiver. His wife has significant lower extremity edema.    Past Medical History  Diagnosis Date  . A-fib ([place])   . Dementia   . HTN (hypertension)   . CAD (coronary artery disease)     s/p PCI x 3 (09/2007)  . Carotid artery stenosis     left  . Sciatica   . Pneumothorax 02/18/2014  . Thrombocytopenia ([place])   . Thyroid mass   . Memory loss   .  Syncope and collapse   . Vision abnormalities     Past Surgical History  Procedure Laterality Date  . Coronary stent placement  2009  . Hernia repair    . Carotid endarterectomy    . Cataract extraction    . Chest tube insertion Right 02/18/2014  . Inguinal hernia repair    . Tonsillectomy    . Coronary angioplasty  with stent placement    . Achilles tendon repair Left      Current Outpatient Prescriptions  Medication Sig Dispense Refill  . acetaminophen (TYLENOL) 500 MG tablet Take 500 mg by mouth every 6 (six) hours as needed for mild pain.    Marland Kitchen aspirin EC 81 MG tablet Take 81 mg by mouth daily.    Marland Kitchen atorvastatin (LIPITOR) 80 MG tablet Take 40 mg by mouth daily.    Marland Kitchen b complex vitamins tablet Take 1 tablet by mouth daily.    . cholecalciferol (VITAMIN D) 1000 UNITS tablet Take 1,000 Units by mouth daily.    . feeding supplement, ENSURE COMPLETE, (ENSURE COMPLETE) LIQD Take 237 mLs by mouth 2 (two) times daily between meals.    . fluticasone (FLONASE) 50 MCG/ACT nasal spray Place 2 sprays into both nostrils as needed.     . gabapentin (NEURONTIN) 400 MG capsule Take 400 mg by mouth daily.    Marland Kitchen ipratropium (ATROVENT) 0.06 % nasal spray Place 2 sprays into both nostrils 3 (three) times daily.     . magnesium oxide (MAG-OX) 400 MG tablet Take 400 mg by mouth daily.    . metroNIDAZOLE (METROGEL) 1 % gel Apply 1 application topically 2 (two) times daily as needed (rosacea). To face    . NAMZARIC 28-10 MG CP24 Take 28 mg by mouth daily.     . sertraline (ZOLOFT) 25 MG tablet Take 25 mg by mouth daily.     . sotalol (BETAPACE) 80 MG tablet Take 40 mg by mouth 2 (two) times daily.     No current facility-administered medications for this visit.    Allergies:   Review of patient's allergies indicates no known allergies.    Social History:  The patient  reports that he has never smoked. He has never used smokeless tobacco. He reports that he drinks alcohol. He reports that he does not use illicit drugs.   Family History:  The patient's family history includes Diabetes in his father; Heart disease in his mother.    ROS:  Please see the history of present illness.   Otherwise, review of systems are positive for falls, dementia, feeling cold (TSH was normal in hospital)..   All other systems are reviewed  and negative.    PHYSICAL EXAM: VS:  There were no vitals taken for this visit. , BMI There is no weight on file to calculate BMI. GEN: Thin, elderly, in no acute distress HEENT: normal Neck: no JVD, + bialt carotid bruits/ radiation of aortic murmur, no masses Cardiac: RRR; 3/6 musical systolic murmur,no  rubs, or gallops,no edema  Respiratory:  clear to auscultation bilaterally, normal work of breathing GI: soft, nontender, nondistended, + BS MS: left hand deformity  Skin: warm and dry, no rash Neuro:  MAE, mildly frail. Ambulates slowly.  Psych: euthymic mood, dementia noted.    EKG:  EKG is not ordered today.   Recent Labs: No results found for requested labs within last 365 days.    Lipid Panel No results found for: CHOL, TRIG, HDL, CHOLHDL, VLDL, LDLCALC, LDLDIRECT  Wt Readings from Last 3 Encounters:  04/01/15 147 lb (66.679 kg)  07/01/14 146 lb (66.225 kg)  05/12/14 138 lb 3.2 oz (62.687 kg)      Other studies Reviewed: Additional studies/ records that were reviewed today include: Prior records, hospital, labs. Review of the above records demonstrates: as above.  Event Monitor 3/16 NSR, several episodes of marked bradycardia, episode of PSVT and 2 episodes of 4 beats NSVT  Carotid US 2/16 R 40-59% ICA L 1-39% ICA  Echo 12/15 Mild LVH, EF 123456, grade 1 diastolic dysfunction, severe AS (peak 70 mmHg, mean 44 mmHg), mild AI, mild MR, mild LAE, PASP 41 mmHg  ASSESSMENT AND PLAN:   Aortic stenosis-severe. Agree with conservative management giving other comorbidities, dementia. Family is in agreement. Discussed possibilities of worsening heart failure, arrhythmic episode. No significant change in symptoms.  Paroxysmal atrial fibrillation-no evidence of atrial fibrillation on 30 day event monitor. Brief PAT. We will continue with low-dose sotalol. Hopefully this will keep atrial fibrillation in check. This seems to be working at this point. They understand  risk of potential embolic stroke if atrial fibrillation occurs. Only on aspirin. CHADS2-VASc=4  Nonsustained ventricular tachycardia-2 separate episodes, 4 beats each of nonsustained ventricle tachycardia was noted on event monitor. Continue with current management strategy. Conservative.  Orthostatic hypotension-compression hose. Salt liberalization. Hydration. Agree with trying to maintain higher than normal blood pressure. They provided me with several readings from home most of which are demonstrating quite significant orthostatic hypotension. Be careful especially when getting out of bed. We discussed this. Falls are the main reason why we are no longer on anticoagulation. Lengthy discussion again today. He is wearing compression hose  Dementia-Namenda, Aricept. Fairly advanced. They have 24-hour help at home. They live in apartments next to her's teeter on [address]. Avoid outdoor steps. Dr. Felipa Eth has been seen.  Hyperlipidemia-I decreased atorvastatin from 80 down to 40 mg. Discontinued Zetia for simplification.   Carotid stenosis-prior left sided carotid stent x 3. Aspirin, statin.   Current medicines are reviewed at length with the patient today.  The patient has concerns regarding medicines.  The following changes have been made:  none  Labs/ tests ordered today include: none  No orders of the defined types were placed in this encounter.     Disposition:   FU with Eddie [name] in 6 months   Signed, Candee Furbish, MD  08/03/2015 1:22 PM    [facility] [address], [place], [place]  [id number] Phone: [phone]; Fax: [phone]

## 2015-08-13 DIAGNOSIS — J209 Acute bronchitis, unspecified: Secondary | ICD-10-CM | POA: Diagnosis not present

## 2015-08-26 DIAGNOSIS — R05 Cough: Secondary | ICD-10-CM | POA: Diagnosis not present

## 2015-09-15 ENCOUNTER — Ambulatory Visit
Admission: RE | Admit: 2015-09-15 | Discharge: 2015-09-15 | Disposition: A | Discharge status: Home / Self Care | Payer: Medicare Other | Source: Ambulatory Visit | Attending: Geriatric Medicine | Admitting: Geriatric Medicine

## 2015-09-15 ENCOUNTER — Other Ambulatory Visit: Payer: Self-pay | Admitting: Geriatric Medicine

## 2015-09-15 DIAGNOSIS — R05 Cough: Secondary | ICD-10-CM

## 2015-09-15 DIAGNOSIS — R059 Cough, unspecified: Secondary | ICD-10-CM

## 2015-09-15 DIAGNOSIS — I10 Essential (primary) hypertension: Secondary | ICD-10-CM | POA: Diagnosis not present

## 2015-09-15 DIAGNOSIS — G301 Alzheimer's disease with late onset: Secondary | ICD-10-CM | POA: Diagnosis not present

## 2015-09-15 DIAGNOSIS — M5431 Sciatica, right side: Secondary | ICD-10-CM | POA: Diagnosis not present

## 2015-09-15 DIAGNOSIS — R5383 Other fatigue: Secondary | ICD-10-CM | POA: Diagnosis not present

## 2015-10-06 ENCOUNTER — Ambulatory Visit (INDEPENDENT_AMBULATORY_CARE_PROVIDER_SITE_OTHER): Payer: Medicare Other | Admitting: Family Medicine

## 2015-10-06 ENCOUNTER — Encounter: Payer: Self-pay | Admitting: Family Medicine

## 2015-10-06 VITALS — BP 95/48 | HR 57 | Temp 99.4°F | Wt 154.0 lb

## 2015-10-06 DIAGNOSIS — R490 Dysphonia: Secondary | ICD-10-CM | POA: Diagnosis not present

## 2015-10-06 DIAGNOSIS — I6523 Occlusion and stenosis of bilateral carotid arteries: Secondary | ICD-10-CM

## 2015-10-06 DIAGNOSIS — I959 Hypotension, unspecified: Secondary | ICD-10-CM

## 2015-10-06 DIAGNOSIS — F0390 Unspecified dementia without behavioral disturbance: Secondary | ICD-10-CM | POA: Diagnosis not present

## 2015-10-06 NOTE — Assessment & Plan Note (Signed)
Seems Stable.  Will send for records

## 2015-10-06 NOTE — Progress Notes (Signed)
Subjective  Patient is presenting with the following illnesses  Hoarseness and Nasal Symptoms Has been since he had respiratory illness a few months ago was treated with antibiotics and prednisone. Use nasal steroid and atrovent daily since.  Voice is hoarse but slowly improving and his nose drains clear fluid frequently.  No choking   Dementia Has has diagnosis for years.  Has been on combination dementia medication for years and sertraline.  Family feels is slowly worsening.  He is easy to redirect.  Has 24 hour supervision.  Is acitve  Hypotension Has history of orthostatic hypotension and medications have been adjusted by cardiology.  Also history of severe aortic stenosis  Patient reports no  vision/ hearing changes,anorexia, weight change, fever ,adenopathy, swallowing issues, chest pain, edema,persistant / recurrent cough, hemoptysis, dyspnea(rest, exertional, paroxysmal nocturnal), gastrointestinal  bleeding (melena, rectal bleeding), abdominal pain, excessive heart burn, GU symptoms(dysuria, hematuria, pyuria, voiding/incontinence  Issues) , focal weakness,, concerning skin lesions, depression, anxiety, abnormal bruising/bleeding, major joint swelling.     Chief Complaint noted Review of Symptoms - see HPI PMH - Smoking status noted.    Family, Social and PMH reviewed in Epic  Objective Vital Signs reviewed Alert but appears sleepy.   Cooperative Speaks in a light hoarse voice Neck:  No deformities, thyromegaly, masses, or tenderness noted.   Supple with full range of motion without pain. Mouth - no lesions, mucous membranes are moist, no decaying teeth  Uvela is midline and elevates normally  Lungs:  Normal respiratory effort, chest expands symmetrically. Lungs are clear to auscultation, no crackles or wheezes. Heart - RRR with Gr 3/6 sys m Abdomen: soft and non-tender without masses, organomegaly or hernias noted.  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

## 2015-10-06 NOTE — Patient Instructions (Addendum)
Good to see you today!  Thanks for coming in.  Try off all the nasal medications  Use nasal saline as needed  May have a rebound for 4-5 days  Wear the support stockings all the time he is moving around  Bring the Health care power of attorney next visit   Come back in 1 month

## 2015-10-06 NOTE — Assessment & Plan Note (Signed)
Seems post viral.  Will stop his nasal inhalers to see if improves.

## 2015-10-06 NOTE — Assessment & Plan Note (Signed)
BP Readings from Last 3 Encounters:  10/06/15 95/48  08/03/15 120/80  04/01/15 132/60   Seems to be a chronic issue likely related to AS.   He was asymptomatic today and stood and walked without problems.   Encouraged support stocking as per Dr Marlou Porch

## 2015-10-12 ENCOUNTER — Other Ambulatory Visit: Payer: Self-pay | Admitting: Geriatric Medicine

## 2015-11-03 ENCOUNTER — Other Ambulatory Visit: Payer: Self-pay | Admitting: Family Medicine

## 2015-11-03 ENCOUNTER — Encounter: Payer: Self-pay | Admitting: Family Medicine

## 2015-11-03 ENCOUNTER — Ambulatory Visit (INDEPENDENT_AMBULATORY_CARE_PROVIDER_SITE_OTHER): Payer: Medicare Other | Admitting: Family Medicine

## 2015-11-03 DIAGNOSIS — I959 Hypotension, unspecified: Secondary | ICD-10-CM

## 2015-11-03 DIAGNOSIS — R490 Dysphonia: Secondary | ICD-10-CM | POA: Diagnosis not present

## 2015-11-03 DIAGNOSIS — F0390 Unspecified dementia without behavioral disturbance: Secondary | ICD-10-CM | POA: Diagnosis not present

## 2015-11-03 DIAGNOSIS — I6523 Occlusion and stenosis of bilateral carotid arteries: Secondary | ICD-10-CM

## 2015-11-03 NOTE — Patient Instructions (Signed)
Good to see you today!  Thanks for coming in.  Please bring all your medication bottles next visit  Use tylenol as needed for thumb pain  I will send in refill for lipitor  If your vision is a consistent complaint see your eye doctor

## 2015-11-03 NOTE — Telephone Encounter (Signed)
Needs refill on namzaric.  [employer]

## 2015-11-03 NOTE — Progress Notes (Signed)
Subjective  Patient is presenting with the following illnesses  Nasal Congestion and cough Improved according to wife and caretaker.  Rare choking episodes.  No fever or chest pain or shortness of breath.  Hoarseness They believe this is chronic and stable over at least the last 2-3 years.  Does not seem to cause him pain.  No vomiting  Dementia Stable.  He is usually cooperative.  Seems to have intermittent complaints of pain or sensations that come and then seem to abate quickly.  They have noticed a slow steady decline in congnition.  Tolerating his medication well.  Hypotension Does not seem to feel lightheadness when walks.  No falls.  No chest pain or shortness of breath.  No edema.  Is not wearing compression stockings to day.      Chief Complaint noted Review of Symptoms - see HPI PMH - Smoking status noted.     Objective Vital Signs reviewed  Alert interactive Hoarse soft voice Throat: normal mucosa, no exudate, uvula midline, no redness Lungs:  Normal respiratory effort, chest expands symmetrically. Lungs are clear to auscultation, no crackles or wheezes. Heart - Regular rate and rhythm.  No murmurs, gallops or rubs.    Extremities:  No cyanosis, edema, or deformity noted with good range of motion of all major joints.      Assessments/Plans  No problem-specific Assessment & Plan notes found for this encounter.   See Encounter view if individual problem A/Ps not visible See after visit summary for details of patient instuctions

## 2015-11-04 MED ORDER — NAMZARIC 28-10 MG PO CP24: 28.0000 mg | ORAL_CAPSULE | Freq: Every day | ORAL | 6 refills | Status: DC

## 2015-11-04 NOTE — Assessment & Plan Note (Signed)
Stable.  Continue current medications.

## 2015-11-04 NOTE — Assessment & Plan Note (Signed)
This seems to be chronic.  Discussed possible work up with family including ENT evaluation and or swallowing study.  They do not see the need for work up unless causing him symptoms.  Will monitor

## 2015-11-04 NOTE — Assessment & Plan Note (Signed)
BP Readings from Last 3 Encounters:  11/03/15 110/62  10/06/15 (!) 95/48  08/03/15 120/80    Improved today.  He is asymptomatic as best we can tell.  Will monitor.

## 2015-11-22 IMAGING — CR DG CHEST 2V
2 series · 2 of 2 positions shown · non-contrast
Comparison: None.

CLINICAL DATA: Fall with right-sided chest and rib pain. Initial
encounter.

EXAM:
CHEST  2 VIEW

[chest lat]
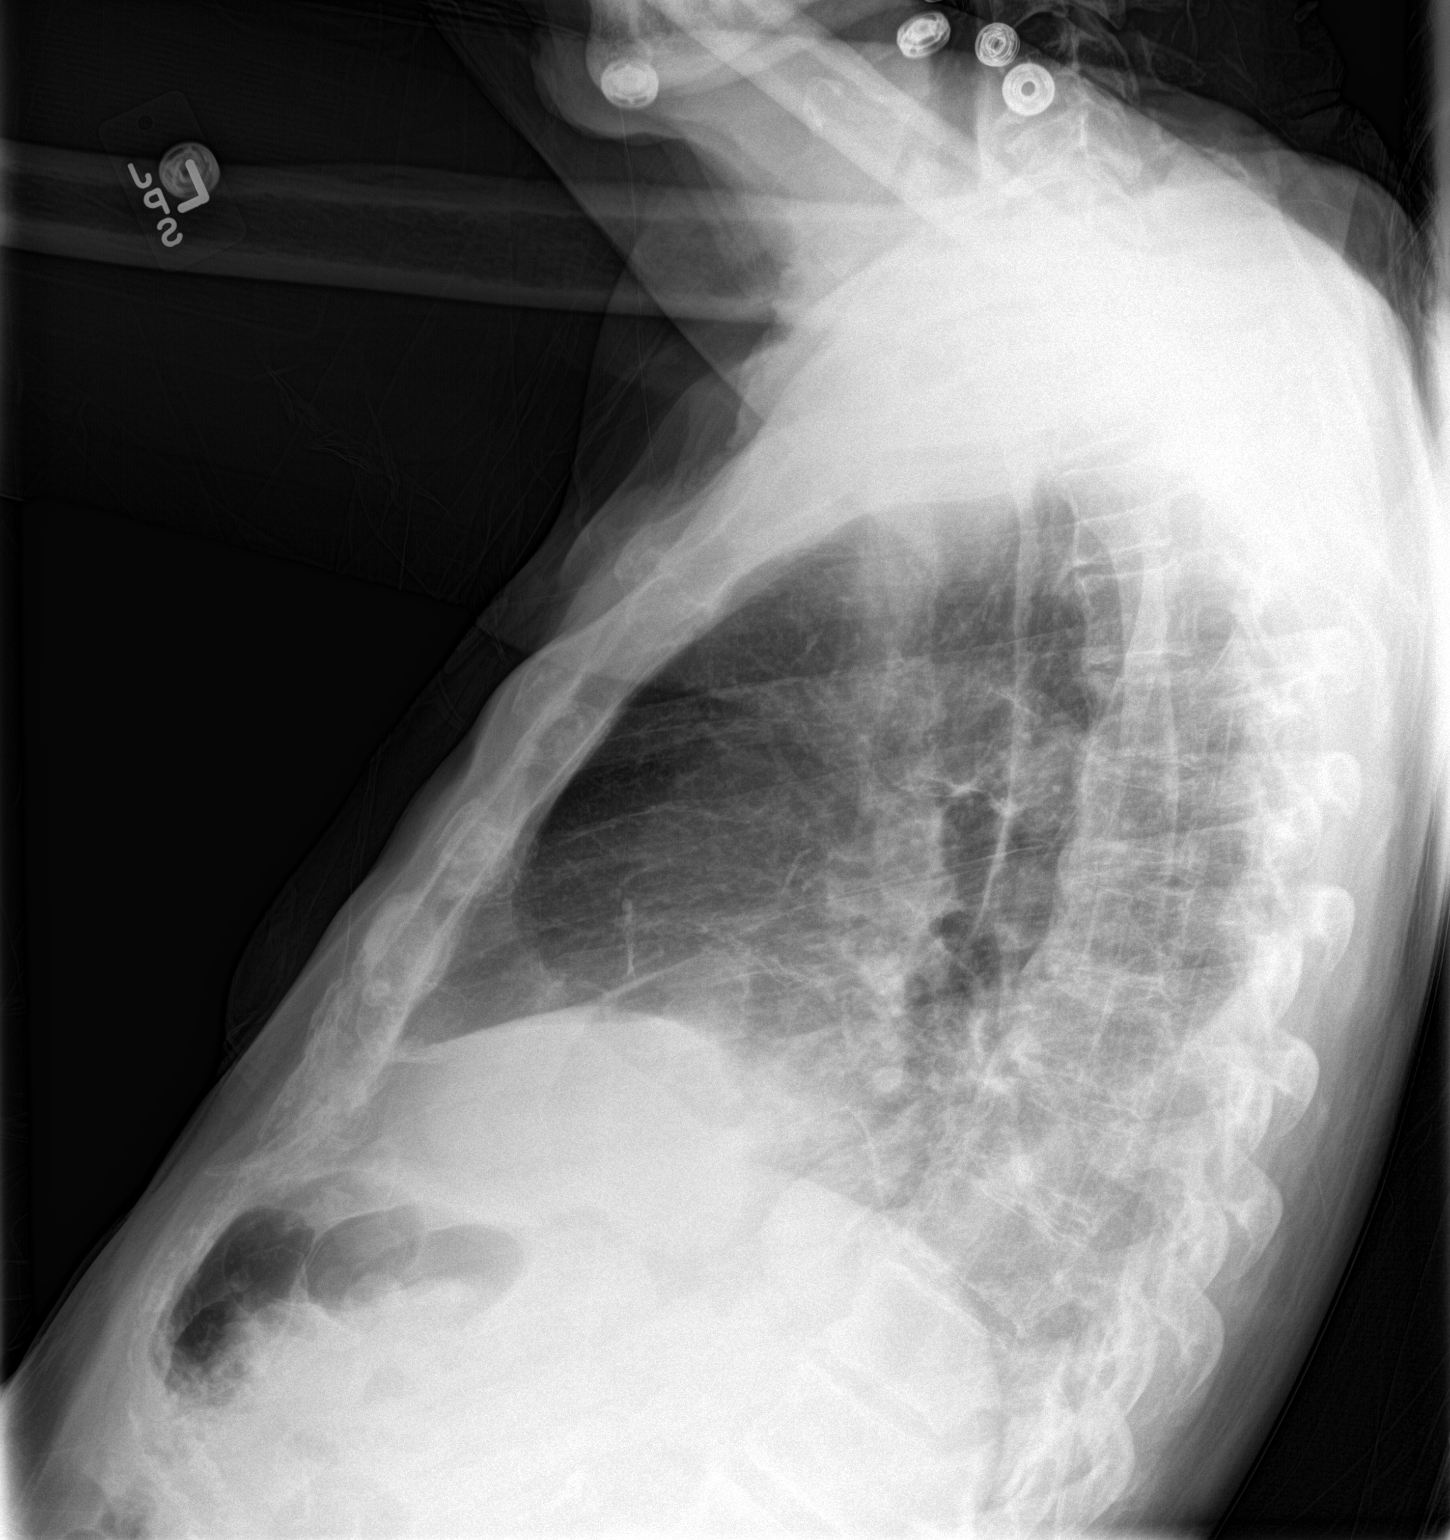

[chest ap]
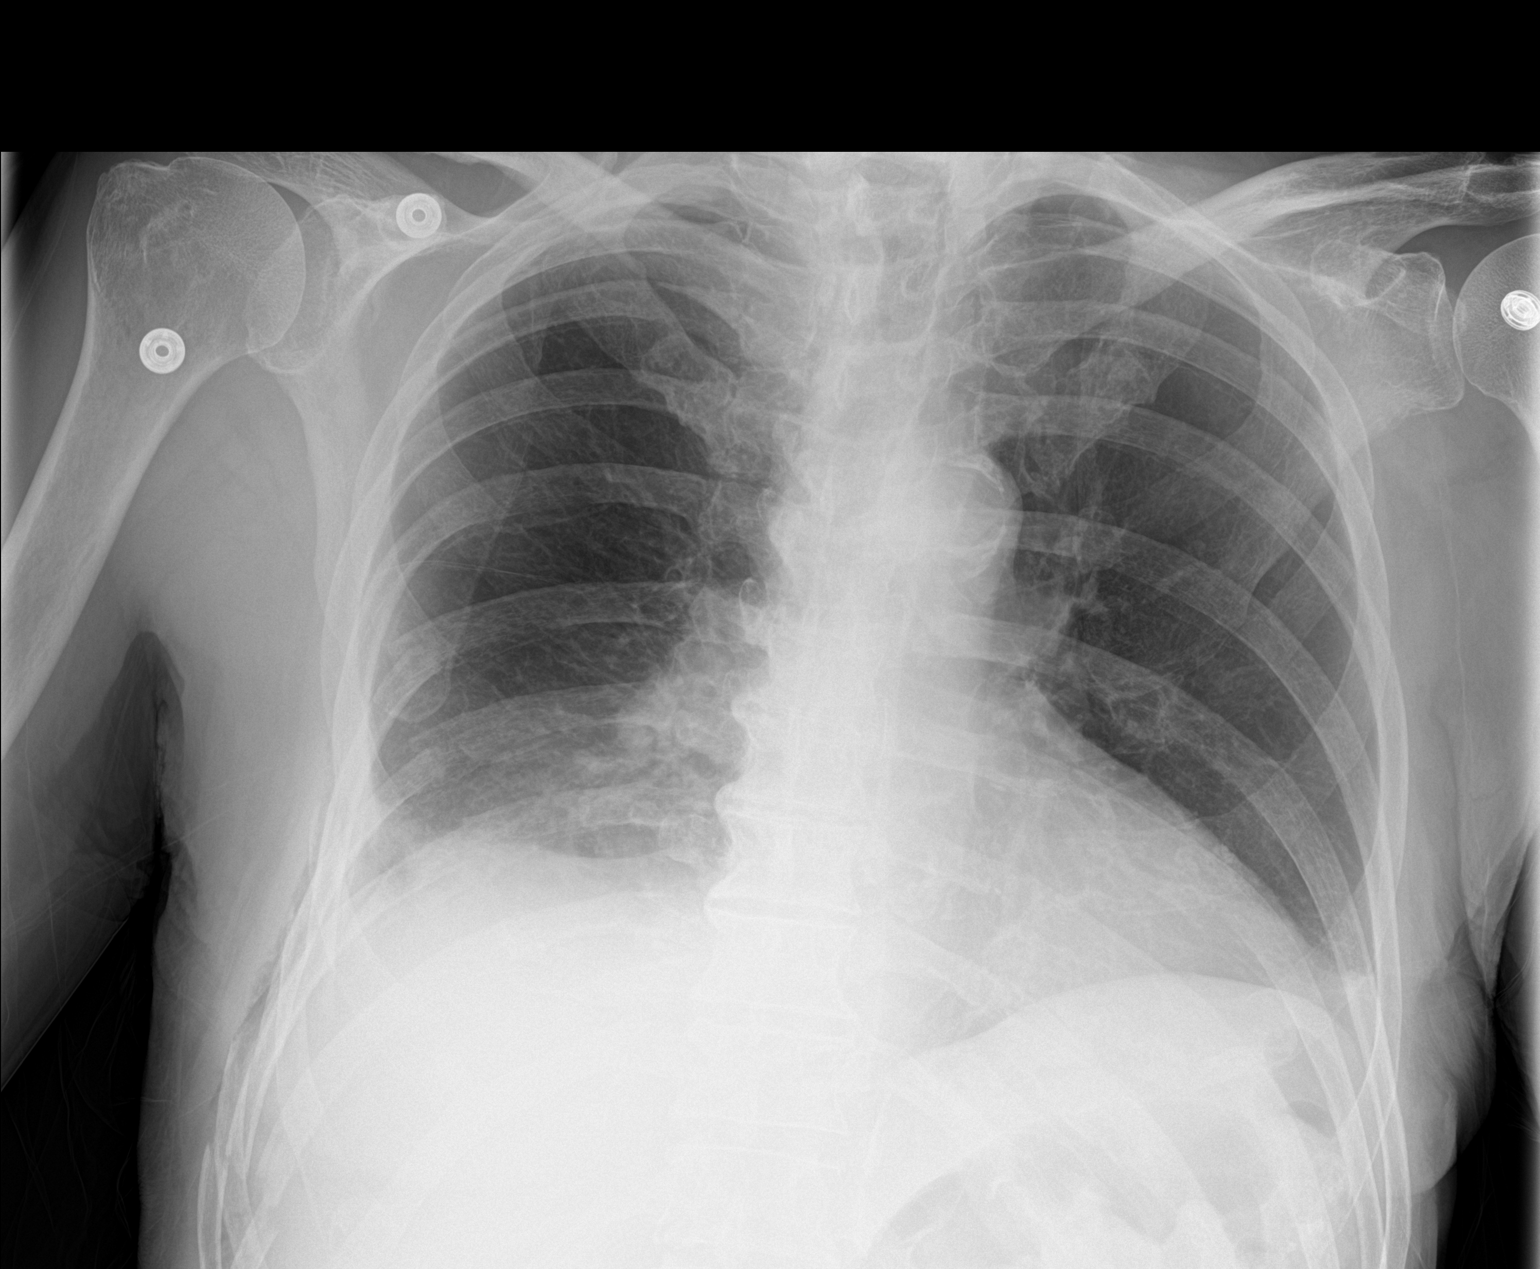

[2 of 2 positions shown; findings below may reference images not displayed]

FINDINGS: Multiple right-sided rib fractures. Posterior lateral seventh,
eighth, ninth, tenth. There may be segmental fractures inferiorly.
There is subcutaneous emphysema adjacent rib fractures. Although no
gross pneumothorax is seen, this subcutaneous air suggests pleural
air.

Mild cardiomegaly with aortic atherosclerosis. Small volume
right-sided pleural fluid/hemothorax. Right base airspace disease.
IMPRESSION: Multiple right-sided rib fractures with adjacent subcutaneous air.
This suggests otherwise occult right-sided pneumothorax. Consider
further evaluation with chest and abdominal CT.

Small volume right pleural fluid/hemothorax. Adjacent airspace
disease which is likely atelectasis.

## 2015-11-24 ENCOUNTER — Encounter: Payer: Self-pay | Admitting: Family Medicine

## 2015-11-24 ENCOUNTER — Ambulatory Visit (INDEPENDENT_AMBULATORY_CARE_PROVIDER_SITE_OTHER): Payer: Medicare Other | Admitting: Family Medicine

## 2015-11-24 VITALS — BP 107/51 | HR 55 | Temp 98.2°F | Wt 155.0 lb

## 2015-11-24 DIAGNOSIS — R197 Diarrhea, unspecified: Secondary | ICD-10-CM | POA: Diagnosis not present

## 2015-11-24 DIAGNOSIS — I6523 Occlusion and stenosis of bilateral carotid arteries: Secondary | ICD-10-CM

## 2015-11-24 LAB — BASIC METABOLIC PANEL WITH GFR
BUN: 18 mg/dL (ref 7–25)
CO2: 30 mmol/L (ref 20–31)
CREATININE: 1.11 mg/dL (ref 0.70–1.11)
Calcium: 9.2 mg/dL (ref 8.6–10.3)
Chloride: 106 mmol/L (ref 98–110)
GFR, EST AFRICAN AMERICAN: 72 mL/min (ref >=60)
GFR, Est Non African American: 62 mL/min (ref >=60)
GLUCOSE: 121 mg/dL — AB (ref 65–99)
Potassium: 3.6 mmol/L (ref 3.5–5.3)
Sodium: 143 mmol/L (ref 135–146)

## 2015-11-24 LAB — MAGNESIUM: Magnesium: 2.1 mg/dL (ref 1.5–2.5)

## 2015-11-24 IMAGING — CR DG CHEST 1V PORT
1 series · 1 of 1 positions shown · non-contrast
Comparison: 04/22/2013 .

CLINICAL DATA: Shortness of breath.

EXAM:
PORTABLE CHEST - 1 VIEW

[portable]
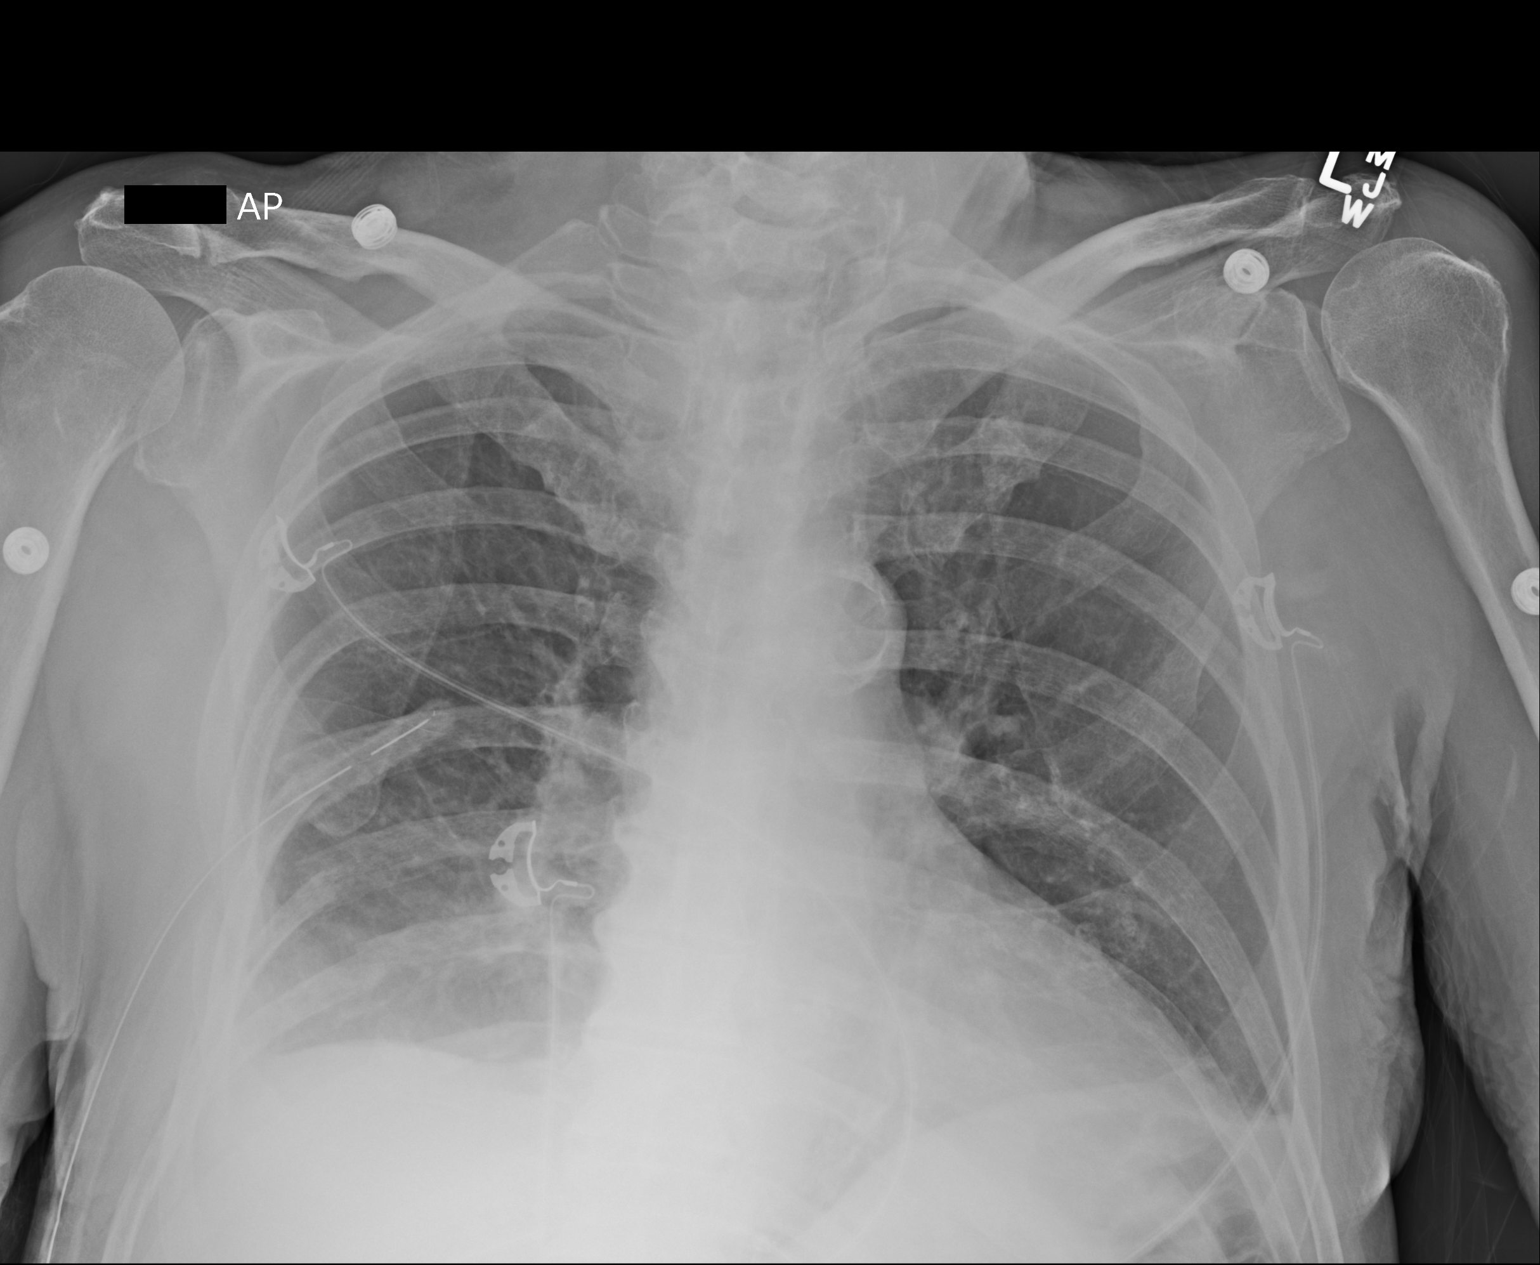

[1 of 1 positions shown; findings below may reference images not displayed]

FINDINGS: Right chest tube in stable position. Mediastinum and hilar
structures are normal. Heart size stable. Persistent right base
atelectasis and/or infiltrate. Subsegmental atelectasis left lung
base unchanged. No pleural effusion or pneumothorax. Multiple right
rib fractures.
IMPRESSION: 1. Right chest tube in stable position.  No pneumothorax.
2. Stable right base atelectasis and/or infiltrate. Stable
subsegmental atelectasis left lung base.
3. Multiple right rib fractures.

## 2015-11-24 NOTE — Progress Notes (Signed)
   Subjective: CC: diarrhea [id number] [place] is a 82 y.o. male presenting to clinic today for same day appointment. PCP: Lind Covert, MD Concerns today include:  1. Diarrhea Most of the history is provided by patient's wife and home health aide who report that diarrhea started 2-3 ago but has been intermittent over the last 3-4 weeks.  Denies undercooked foods, foods left out too long, sick contacts, nausea, vomiting, hematochezia, melena, recent travel, fevers.  Patient reports compliance with MgOx.  He was started on medication several years ago by a cardiologist.  Patient's wive reports that he was complaining that his stomach was hurting yesterday.  Some relief with defecation.  Has been taking imodium since last night.  Last dose this morning.  No loose stools this morning.  Hydrating ok.  His wife reports that he started taking Zoloft about 3 months ago with Dr Felipa Eth.  Social History Reviewed. FamHx and MedHx reviewed.  Please see EMR.  ROS: Per HPI  Objective: Office vital signs reviewed. BP (!) 107/51   Pulse (!) 55   Temp 98.2 F (36.8 C) (Oral)   Wt 155 lb (70.3 kg)   SpO2 100%   BMI 21.62 kg/m   Physical Examination:  General: Awake, alert, well appearing, No acute distress HEENT: Normal, MMM GI: soft, non-tender, non-distended, bowel sounds present x4, no hepatomegaly, no splenomegaly, no masses  Assessment/ Plan: 82 y.o. male   1. Diarrhea, unspecified type.  No red flag signs.  Does not appear infectious at this time.  Patient is on several medications that can induce diarrhea, including Zoloft, MagOx, and Namzaric.  Since Zoloft is the newest, I am going to taper him off of this first to see if this resolved his diarrhea.  He may need to discontinue the others.  I am not sure why he is currently on MagOx.  I will obtain a Mg level and discuss discontinuing this medication as well.   - BASIC METABOLIC PANEL WITH GFR - Magnesium - Will contact patient  with results - Zoloft taper provided, see AVS - Encouraged to hydrate well, use drinks with electrolytes in them - Could consider obtaining stool studies if persistent diarrhea - Return precautions reviewed  Follow up with me in 2 weeks, as Dr Erin Hearing does not have availability for >1 month.  Janora Norlander, DO PGY-3, [facility]

## 2015-11-24 NOTE — Patient Instructions (Addendum)
Zoloft Taper: For the next 4 days take 1 tablet every other day.  Then take 1/2 tablet every other day x4 days.  Then stop.  We may need to discontinue other medications if this does not work.  See me back on 9/20 @ 2:45pm.  Follow up with Dr Erin Hearing in 2 weeks if diarrhea has not improved.   Diarrhea Diarrhea is watery poop (stool). It can make you feel weak, tired, thirsty, or give you a dry mouth (signs of dehydration). Watery poop is a sign of another problem, most often an infection. It often lasts 2-3 days. It can last longer if it is a sign of something serious. Take care of yourself as told by your doctor. HOME CARE   Drink 1 cup (8 ounces) of fluid each time you have watery poop.  Do not drink the following fluids:  Those that contain simple sugars (fructose, glucose, galactose, lactose, sucrose, maltose).  Sports drinks.  Fruit juices.  Whole milk products.  Sodas.  Drinks with caffeine (coffee, tea, soda) or alcohol.  Oral rehydration solution may be used if the doctor says it is okay. You may make your own solution. Follow this recipe:   - teaspoon table salt.   teaspoon baking soda.   teaspoon salt substitute containing potassium chloride.  1 tablespoons sugar.  1 liter (34 ounces) of water.  Avoid the following foods:  High fiber foods, such as raw fruits and vegetables.  Nuts, seeds, and whole grain breads and cereals.   Those that are sweetened with sugar alcohols (xylitol, sorbitol, mannitol).  Try eating the following foods:  Starchy foods, such as rice, toast, pasta, low-sugar cereal, oatmeal, baked potatoes, crackers, and bagels.  Bananas.  Applesauce.  Eat probiotic-rich foods, such as yogurt and milk products that are fermented.  Wash your hands well after each time you have watery poop.  Only take medicine as told by your doctor.  Take a warm bath to help lessen burning or pain from having watery poop. GET HELP RIGHT AWAY IF:    You cannot drink fluids without throwing up (vomiting).  You keep throwing up.  You have blood in your poop, or your poop looks black and tarry.  You do not pee (urinate) in 6-8 hours, or there is only a small amount of very dark pee.  You have belly (abdominal) pain that gets worse or stays in the same spot (localizes).  You are weak, dizzy, confused, or light-headed.  You have a very bad headache.  Your watery poop gets worse or does not get better.  You have a fever or lasting symptoms for more than 2-3 days.  You have a fever and your symptoms suddenly get worse. MAKE SURE YOU:   Understand these instructions.  Will watch your condition.  Will get help right away if you are not doing well or get worse.   This information is not intended to replace advice given to you by your health care provider. Make sure you discuss any questions you have with your health care provider.   Document Released: 08/23/2007 Document Revised: 03/27/2014 Document Reviewed: 11/12/2011 Elsevier Interactive Patient Education [employer].

## 2015-11-25 ENCOUNTER — Encounter: Payer: Self-pay | Admitting: Family Medicine

## 2015-11-26 ENCOUNTER — Telehealth: Payer: Self-pay | Admitting: Cardiology

## 2015-11-26 NOTE — Telephone Encounter (Signed)
Called patient to let him know his BP reading is good. Patient's SBP has been in 110's, this is after he has taken his BP medications. Patient has hardly any energy. Encouraged patient to eat a healthy diet and light exercise, such as walking. Patient and his wife verbalized understanding, and will call the office if patient's BP is low before he takes his BP medications.

## 2015-11-26 NOTE — Telephone Encounter (Signed)
New Message    Pt c/o BP issue: STAT if pt c/o blurred vision, one-sided weakness or slurred speech  1. What are your last 5 BP readings?  9/8 116/58  2. Are you having any other symptoms (ex. Dizziness, headache, blurred vision, passed out)? Pt wife states pt is sleep and is usually up at this of day  3. What is your BP issue? Pt wife call requesting to speak with RN about what she sound do about pts low bp. Please call back to discuss

## 2015-11-27 IMAGING — CR DG CHEST 1V PORT
1 series · 1 of 1 positions shown · non-contrast
Comparison: 02/22/2014

CLINICAL DATA: Chest tubes, right hemothorax

EXAM:
PORTABLE CHEST - 1 VIEW

[AP]
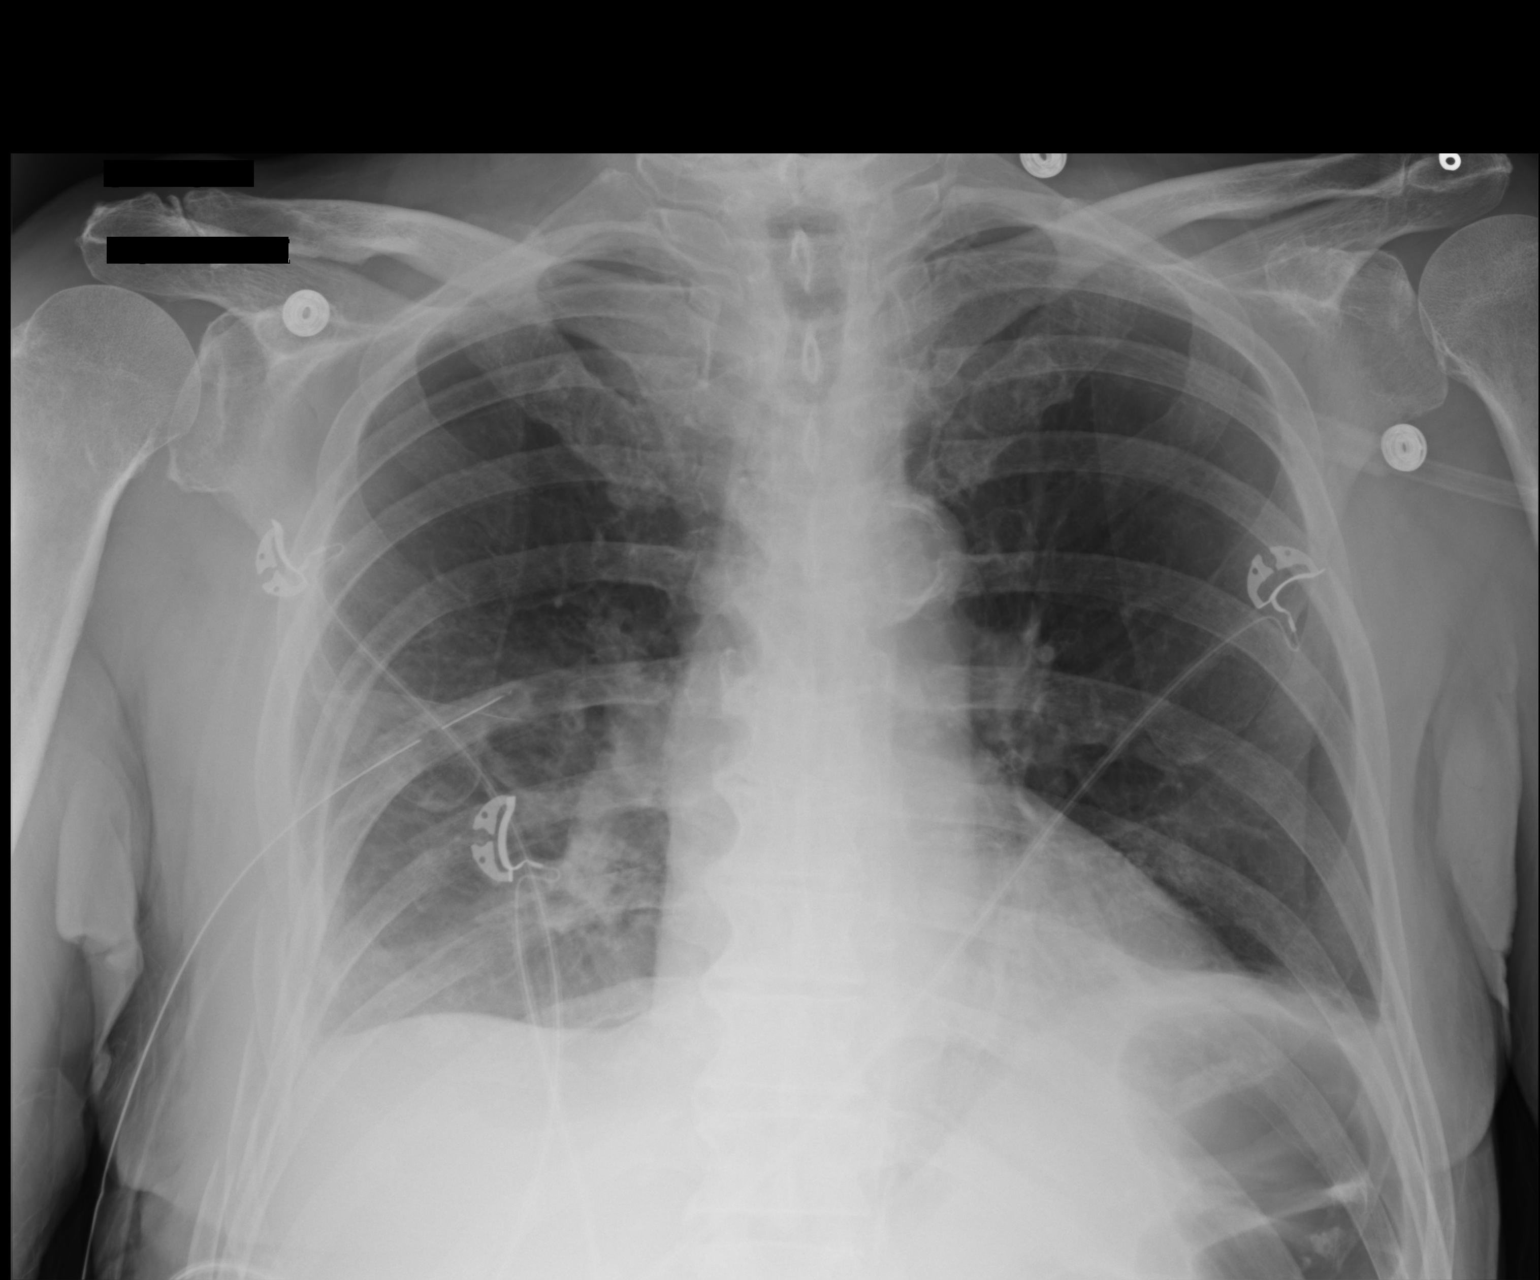

[1 of 1 positions shown; findings below may reference images not displayed]

FINDINGS: Cardiomediastinal silhouette is stable. Trace left pleural effusion
with left basilar atelectasis. Stable right chest tube position.
Right lower displaced rib fractures again noted. Stable tiny right
apical pneumothorax. Trace left pleural effusion with basilar
atelectasis.
IMPRESSION: Trace bilateral pleural effusion with bilateral basilar atelectasis.
Stable tiny right apical pneumothorax. Stable right chest tube
position.

## 2015-11-28 IMAGING — CR DG CHEST 1V PORT
1 series · 1 of 1 positions shown · non-contrast
Comparison: 02/23/2014.

CLINICAL DATA: 81-year-old male with rib fractures and
pneumothorax. Subsequent encounter.

EXAM:
PORTABLE CHEST - 1 VIEW

[portable]
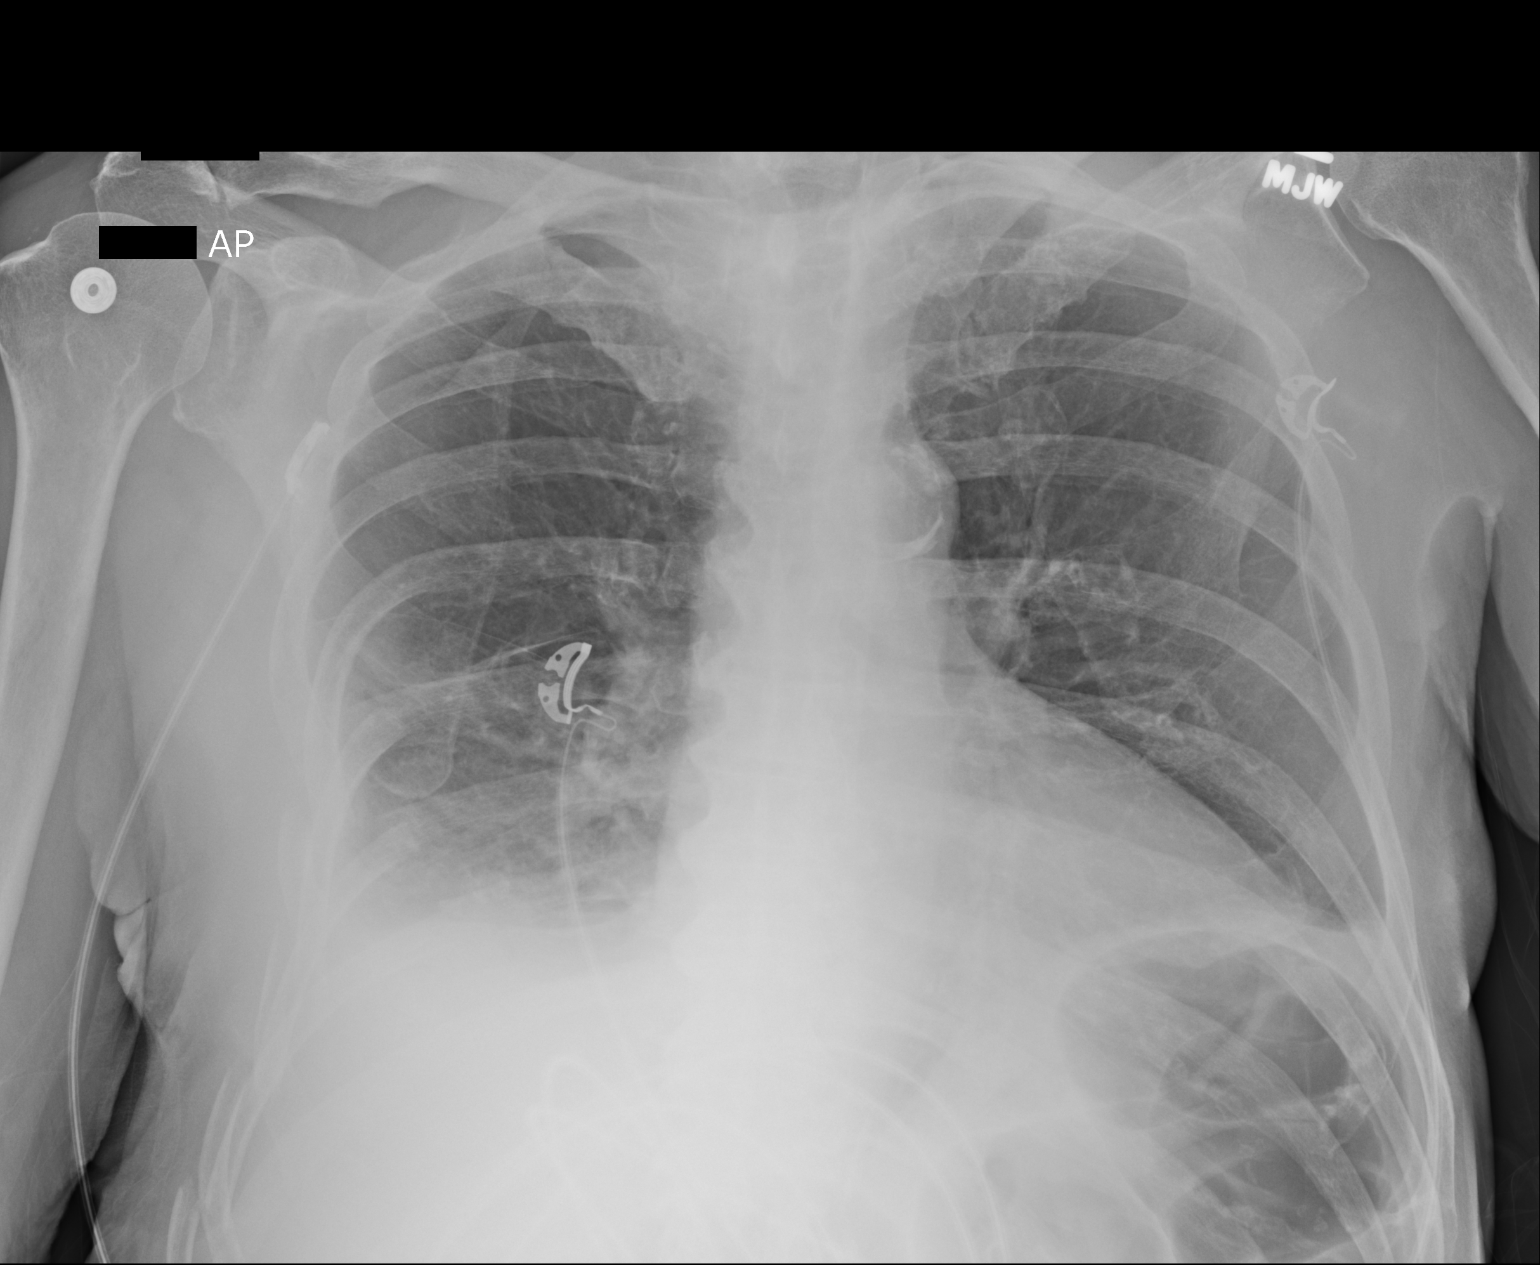

[1 of 1 positions shown; findings below may reference images not displayed]

FINDINGS: Multiple right-sided rib fractures some of which are displaced.

Small (5% or small) right apical pneumothorax similar to prior exam
or minimally smaller.

Opacification right lung base may represent pleural fluid and
atelectatic changes. This limits evaluation for detection of
underlying infiltrate

Heart size top-normal.

Minimal blunting left costophrenic angle unchanged.

Calcified aorta.
IMPRESSION: Multiple right-sided rib fractures some of which are displaced.

Small (5% or small) right apical pneumothorax similar to prior exam
or minimally smaller.

Opacification right lung base may represent pleural fluid and
atelectatic changes. This limits evaluation for detection of
underlying infiltrate

## 2015-12-08 ENCOUNTER — Encounter: Payer: Self-pay | Admitting: Family Medicine

## 2015-12-08 ENCOUNTER — Ambulatory Visit (INDEPENDENT_AMBULATORY_CARE_PROVIDER_SITE_OTHER): Payer: Medicare Other | Admitting: Family Medicine

## 2015-12-08 VITALS — BP 150/66 | HR 61 | Temp 98.1°F | Ht 69.0 in | Wt 155.0 lb

## 2015-12-08 DIAGNOSIS — I6523 Occlusion and stenosis of bilateral carotid arteries: Secondary | ICD-10-CM

## 2015-12-08 DIAGNOSIS — R197 Diarrhea, unspecified: Secondary | ICD-10-CM | POA: Diagnosis not present

## 2015-12-08 DIAGNOSIS — F0390 Unspecified dementia without behavioral disturbance: Secondary | ICD-10-CM | POA: Diagnosis not present

## 2015-12-08 DIAGNOSIS — R5381 Other malaise: Secondary | ICD-10-CM | POA: Diagnosis not present

## 2015-12-08 NOTE — Assessment & Plan Note (Signed)
Improving but not resolved.  Zoloft discontinued less than 5 days ago.  Advised to discontinue Imodium if able.  Reviewed normal electrolyte panel with patient.   Consider stopping/ reducing Mg vs stool studies.  Though not exhibiting any signs of infection at this time.

## 2015-12-08 NOTE — Patient Instructions (Signed)
Stop taking the Imodium for the next couple of days.  If his diarrhea worsens, you can resume use.  Plan to follow up with Dr Erin Hearing as scheduled.    I will contact you will the results of your labs.  If anything is abnormal, I will call you.  Otherwise, expect a copy to be mailed to you.  Diarrhea Diarrhea is frequent loose and watery bowel movements. It can cause you to feel weak and dehydrated. Dehydration can cause you to become tired and thirsty, have a dry mouth, and have decreased urination that often is dark yellow. Diarrhea is a sign of another problem, most often an infection that will not last long. In most cases, diarrhea typically lasts 2-3 days. However, it can last longer if it is a sign of something more serious. It is important to treat your diarrhea as directed by your caregiver to lessen or prevent future episodes of diarrhea. CAUSES  Some common causes include:  Gastrointestinal infections caused by viruses, bacteria, or parasites.  Food poisoning or food allergies.  Certain medicines, such as antibiotics, chemotherapy, and laxatives.  Artificial sweeteners and fructose.  Digestive disorders. HOME CARE INSTRUCTIONS  Ensure adequate fluid intake (hydration): Have 1 cup (8 oz) of fluid for each diarrhea episode. Avoid fluids that contain simple sugars or sports drinks, fruit juices, whole milk products, and sodas. Your urine should be clear or pale yellow if you are drinking enough fluids. Hydrate with an oral rehydration solution that you can purchase at pharmacies, retail stores, and online. You can prepare an oral rehydration solution at home by mixing the following ingredients together:   - tsp table salt.   tsp baking soda.   tsp salt substitute containing potassium chloride.  1  tablespoons sugar.  1 L (34 oz) of water.  Certain foods and beverages may increase the speed at which food moves through the gastrointestinal (GI) tract. These foods and beverages  should be avoided and include:  Caffeinated and alcoholic beverages.  High-fiber foods, such as raw fruits and vegetables, nuts, seeds, and whole grain breads and cereals.  Foods and beverages sweetened with sugar alcohols, such as xylitol, sorbitol, and mannitol.  Some foods may be well tolerated and may help thicken stool including:  Starchy foods, such as rice, toast, pasta, low-sugar cereal, oatmeal, grits, baked potatoes, crackers, and bagels.  Bananas.  Applesauce.  Add probiotic-rich foods to help increase healthy bacteria in the GI tract, such as yogurt and fermented milk products.  Wash your hands well after each diarrhea episode.  Only take over-the-counter or prescription medicines as directed by your caregiver.  Take a warm bath to relieve any burning or pain from frequent diarrhea episodes. SEEK IMMEDIATE MEDICAL CARE IF:   You are unable to keep fluids down.  You have persistent vomiting.  You have blood in your stool, or your stools are black and tarry.  You do not urinate in 6-8 hours, or there is only a small amount of very dark urine.  You have abdominal pain that increases or localizes.  You have weakness, dizziness, confusion, or light-headedness.  You have a severe headache.  Your diarrhea gets worse or does not get better.  You have a fever or persistent symptoms for more than 2-3 days.  You have a fever and your symptoms suddenly get worse. MAKE SURE YOU:   Understand these instructions.  Will watch your condition.  Will get help right away if you are not doing well or get  worse.   This information is not intended to replace advice given to you by your health care provider. Make sure you discuss any questions you have with your health care provider.   Document Released: 02/24/2002 Document Revised: 03/27/2014 Document Reviewed: 11/12/2011 Elsevier Interactive Patient Education [employer].

## 2015-12-08 NOTE — Progress Notes (Signed)
    Subjective: CC: diarrhea follow up HPI: Eddie [name] is a 82 y.o. male presenting to clinic today for follow up.  He is accompanied by his wife and his health aide to today's appt. Concerns today include:  Diarrhea/ Malaise vs decreased motivation Patient's wife reports that diarrhea has gotten somewhat better since discontinuing Zoloft.  However, still has loose stools.  She notes that they never discontinued the imodium but did cut down on the amount he was taking.  She reports that mood has been stable.  However, she is concerned about decreased motivation vs malaise.  He is not wanting to participate in walks, etc as he normally does.  Denies fevers, nausea, vomiting, cough, congestion.  Denies melena, hematochezia, bleeding from any orifice.    Dementia Family voices several questions regarding the prognosis of patient's dementia.  They report compliance with his Aricept/Namenda combo.  Often the patient is unable to express his thoughts.  Social History Reviewed: non smoker. FamHx and MedHx reviewed.  Please see EMR. Health Maintenance: Flu shot  ROS: Per HPI  Objective: Office vital signs reviewed. BP (!) 150/66   Pulse 61   Temp 98.1 F (36.7 C) (Oral)   Ht 5\' 9"  (1.753 m)   Wt 155 lb (70.3 kg)   BMI 22.89 kg/m   Physical Examination:  General: Awake, alert, No acute distress GI: soft, non-tender, non-distended, bowel sounds present x4, no masses MSK: Normal gait and station Neuro: intermittently able to express words but often seems to struggle finding words.  Assessment/ Plan: 82 y.o. male   Diarrhea Improving but not resolved.  Zoloft discontinued less than 5 days ago.  Advised to discontinue Imodium if able.  Reviewed normal electrolyte panel with patient.   Consider stopping/ reducing Mg vs stool studies.  Though not exhibiting any signs of infection at this time.  Dementia Discussed that dementia is often progressive.  Recommended continued social  activities.  Continue current medication.  However, discussed that treatment is often supportive.  PCP to continue monitoring.  Malaise H/o thyroid mass in past.  Having cold intolerance and malaise.  Also, h/o low hgb in past.  No active bleed.  TSH, CBC ordered.  Will contact with results.   Janora Norlander, DO PGY-3, [facility]

## 2015-12-08 NOTE — Assessment & Plan Note (Signed)
Discussed that dementia is often progressive.  Recommended continued social activities.  Continue current medication.  However, discussed that treatment is often supportive.  PCP to continue monitoring.

## 2015-12-08 NOTE — Assessment & Plan Note (Signed)
H/o thyroid mass in past.  Having cold intolerance and malaise.  Also, h/o low hgb in past.  No active bleed.  TSH, CBC ordered.  Will contact with results.

## 2015-12-09 LAB — CBC
HEMATOCRIT: 39.1 % (ref 38.5–50.0)
HEMOGLOBIN: 12.4 g/dL — AB (ref 13.2–17.1)
MCH: 28.2 pg (ref 27.0–33.0)
MCHC: 31.7 g/dL — ABNORMAL LOW (ref 32.0–36.0)
MCV: 88.9 fL (ref 80.0–100.0)
MPV: 10.6 fL (ref 7.5–12.5)
Platelets: 186 10*3/uL (ref 140–400)
RBC: 4.4 MIL/uL (ref 4.20–5.80)
RDW: 14 % (ref 11.0–15.0)
WBC: 7 10*3/uL (ref 3.8–10.8)

## 2015-12-09 LAB — TSH: TSH: 0.86 m[IU]/L (ref 0.40–4.50)

## 2015-12-15 ENCOUNTER — Encounter: Payer: Self-pay | Admitting: Family Medicine

## 2015-12-29 ENCOUNTER — Ambulatory Visit (INDEPENDENT_AMBULATORY_CARE_PROVIDER_SITE_OTHER): Payer: Medicare Other | Admitting: Family Medicine

## 2015-12-29 ENCOUNTER — Encounter: Payer: Self-pay | Admitting: Family Medicine

## 2015-12-29 DIAGNOSIS — I6523 Occlusion and stenosis of bilateral carotid arteries: Secondary | ICD-10-CM

## 2015-12-29 DIAGNOSIS — F0390 Unspecified dementia without behavioral disturbance: Secondary | ICD-10-CM

## 2015-12-29 DIAGNOSIS — J3489 Other specified disorders of nose and nasal sinuses: Secondary | ICD-10-CM | POA: Diagnosis not present

## 2015-12-29 DIAGNOSIS — Z23 Encounter for immunization: Secondary | ICD-10-CM | POA: Diagnosis present

## 2015-12-29 DIAGNOSIS — E785 Hyperlipidemia, unspecified: Secondary | ICD-10-CM

## 2015-12-29 NOTE — Patient Instructions (Signed)
Good to see you today!  Thanks for coming in.  Stop the cholesterol medicine Atorvastatin and we will check labs in 3 months  Come back sooner if he is worsening as far as fatigue when awake  If his nose is still running a lot in 2 weeks then call me and we will change his allergy medicine

## 2015-12-29 NOTE — Assessment & Plan Note (Signed)
Seems more related to more than expected sleep than fatigue with movement.  Disussed that this is expected with dementia and does not need treatment.  They agree

## 2015-12-29 NOTE — Assessment & Plan Note (Signed)
Stable off of SSRI.  Continue current medications

## 2015-12-29 NOTE — Assessment & Plan Note (Signed)
Stable.  Try off medications and follow labs.  Family is unsure if wants to continue

## 2015-12-29 NOTE — Assessment & Plan Note (Signed)
Worsened recently.  ? Allergies.  If does not resolve soon will change antihistamines

## 2015-12-29 NOTE — Progress Notes (Addendum)
Subjective  Patient is presenting with the following illnesses  Fatigue Still sleeps about 12 hours per day.  When awake is as active as usual.  Walking, talking some.  Does not seem to tire more than before.  No fever or nausea and vomiting or weight loss  Diarrhea Resolved  Dementia - stopped Zoloft about 6 weeks ago.  They have not noticed any signifcant change in his mood or activities.   Does not wander at night  Runny nose -was better off sprays and taking clariten up till 2 weeks ago but now with frequent clear discharge.  Minimal sneezing.  Occassionally nocturnal cough.  No shortness of breath or productive cough  Hyperlipidemia - they would like to try being off lipitor and check his cholesterol.  No chest pain or leg pain with walking   Chief Complaint noted Review of Symptoms - see HPI PMH - Smoking status noted.     Objective Vital Signs reviewed Alert Calm interactive Does not follow conversation Walked back to exam room without problems     Assessments/Plans  No problem-specific Assessment & Plan notes found for this encounter.   See Encounter view if individual problem A/Ps not visible See after visit summary for details of patient instuctions

## 2016-01-05 ENCOUNTER — Ambulatory Visit: Payer: Medicare Other | Admitting: Family Medicine

## 2016-01-12 ENCOUNTER — Ambulatory Visit: Payer: Medicare Other | Admitting: Family Medicine

## 2016-01-14 ENCOUNTER — Telehealth: Payer: Self-pay | Admitting: Family Medicine

## 2016-01-14 NOTE — Telephone Encounter (Signed)
Spoke with Niece   Mrs Shear concerned about stopping cholesterol medicine and that I had implied we should stop all cholesterol medication.   I addressed that is not what I meant and see after visit summary for details  They will discuss with Dr Marlou Porch.  I think that is a good idea  Mrs Lanzo will make an appointment with me to discuss further

## 2016-01-30 IMAGING — US US SOFT TISSUE HEAD/NECK
1 series · 14 of 25 positions shown · non-contrast
Comparison: None.

CLINICAL DATA: Right thyroid mass noted on CT chest

EXAM:
THYROID ULTRASOUND
TECHNIQUE: Ultrasound examination of the thyroid gland and adjacent soft
tissues was performed.

[Series 1: us soft tissue head/neck · 0.07mm/px · 14 of 56 slices shown]
[im 1/56]
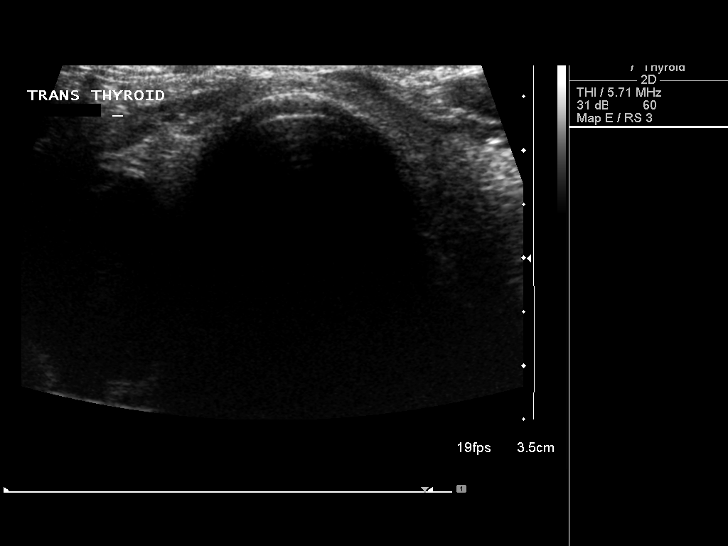
[im 5/56]
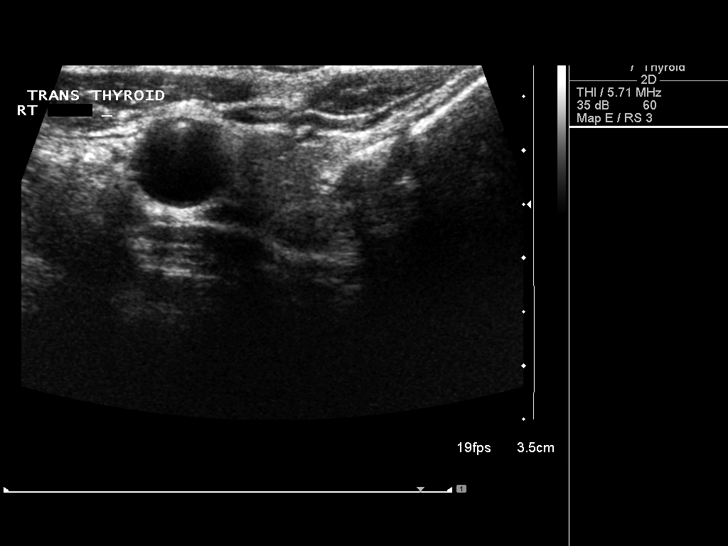
[im 10/56]
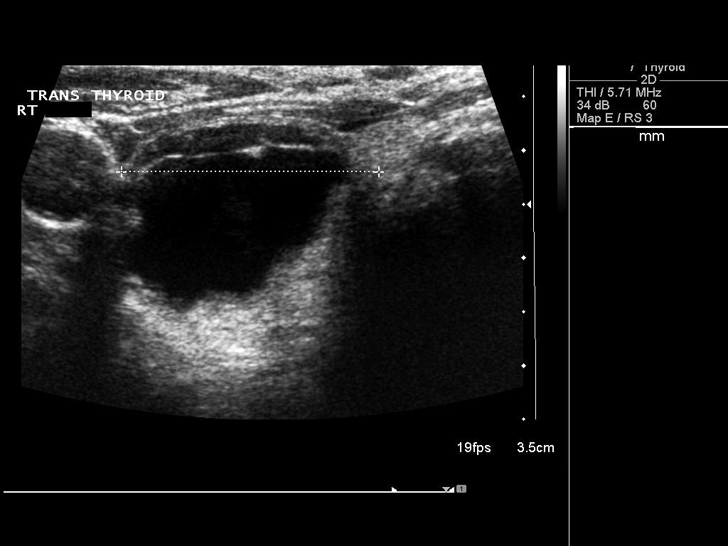
[im 14/56]
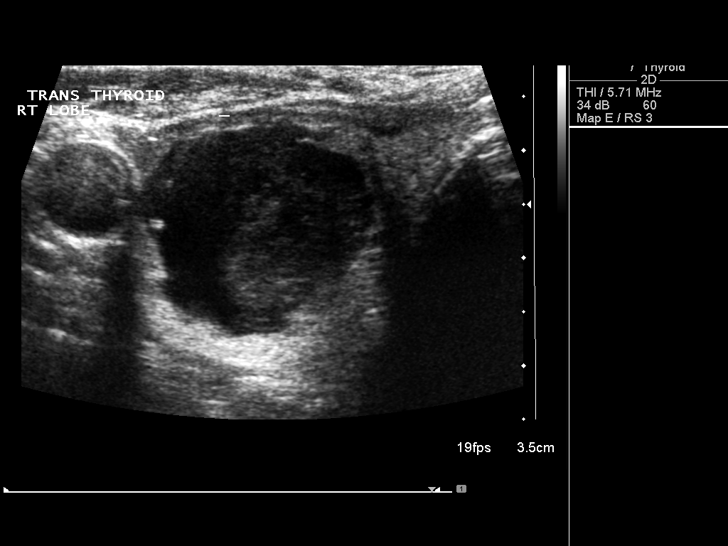
[im 19/56]
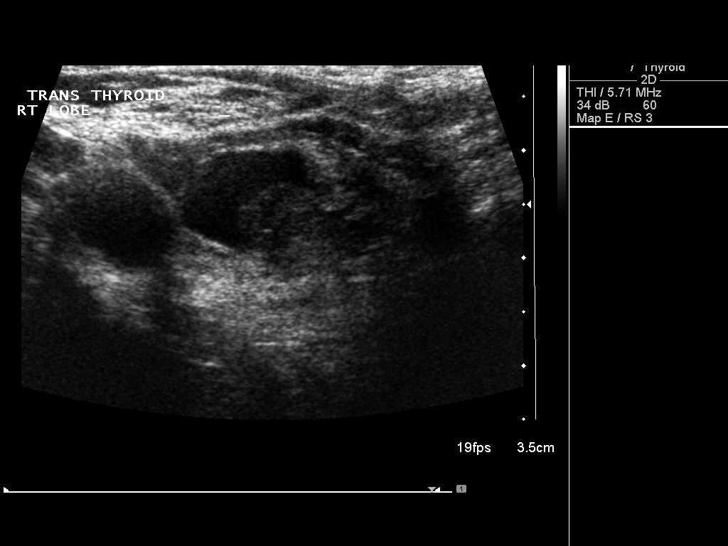
[im 21/56]
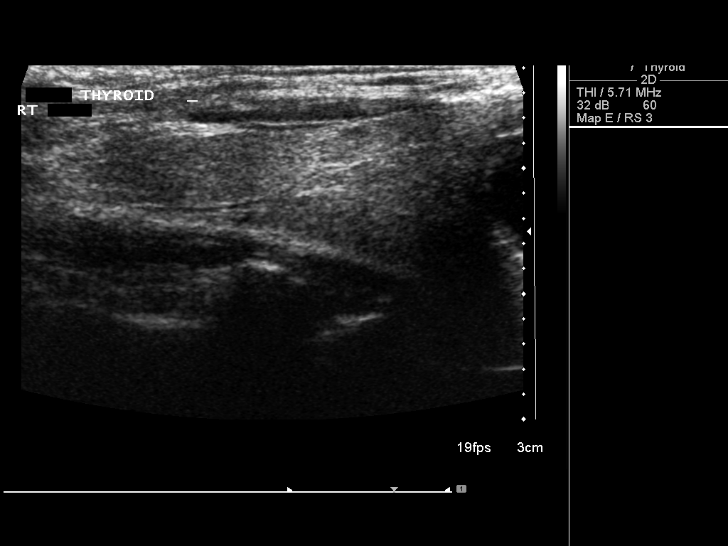
[im 26/56]
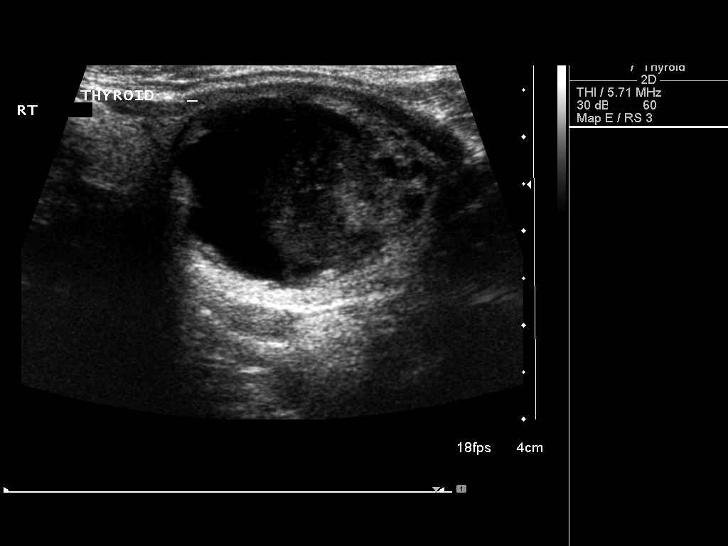
[im 30/56]
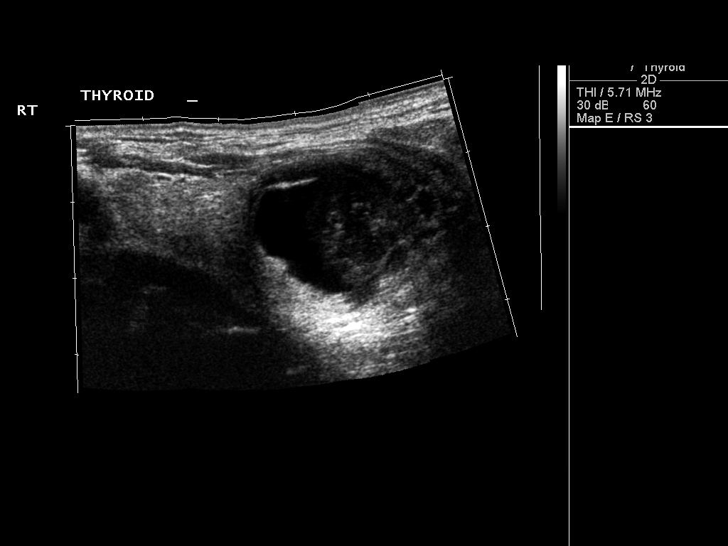
[im 35/56]
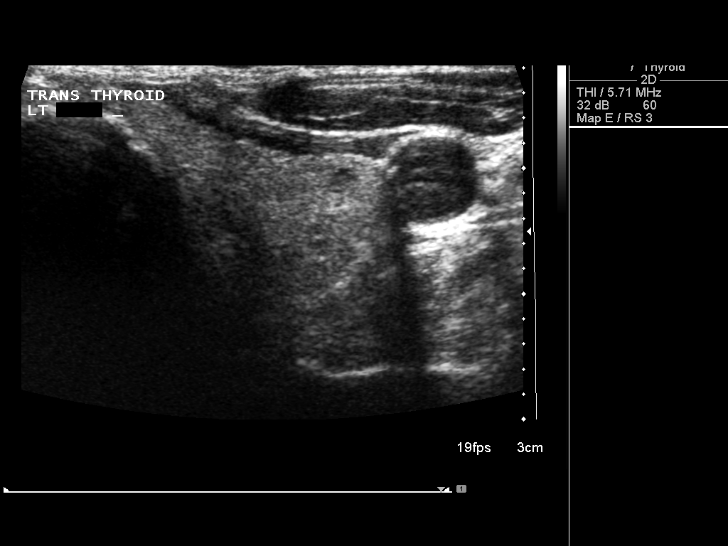
[im 37/56]
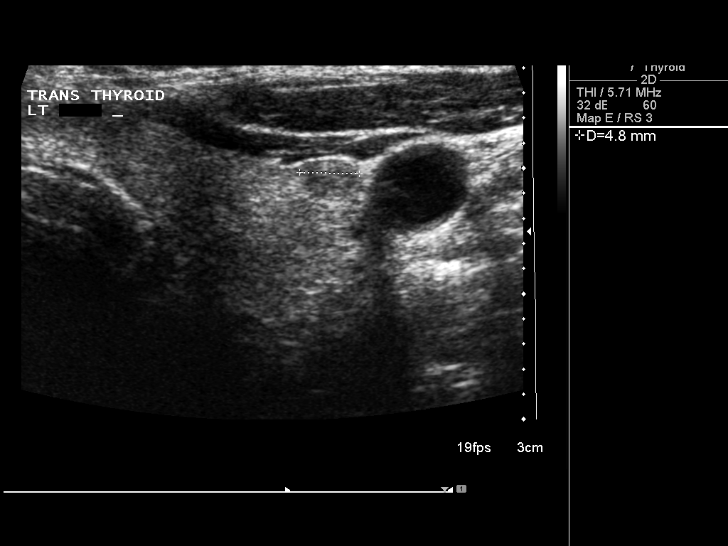
[im 42/56]
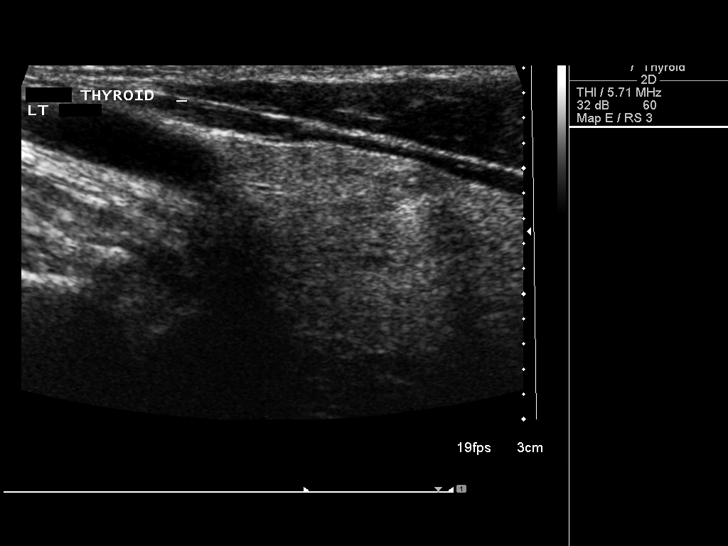
[im 46/56]
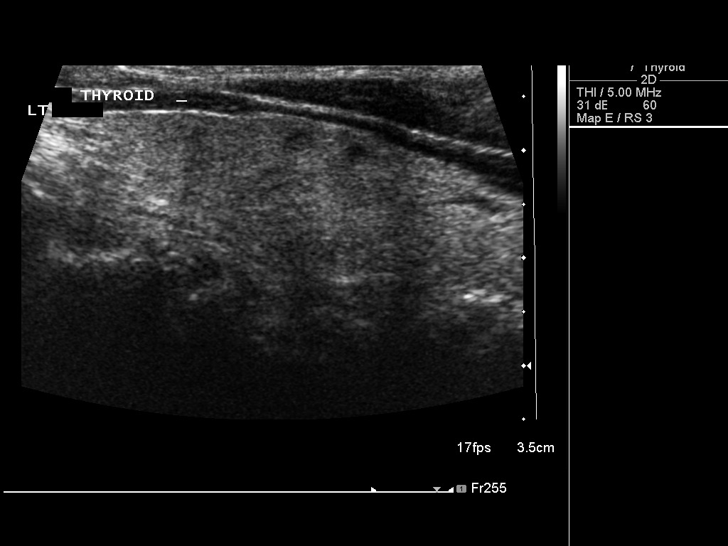
[im 51/56]
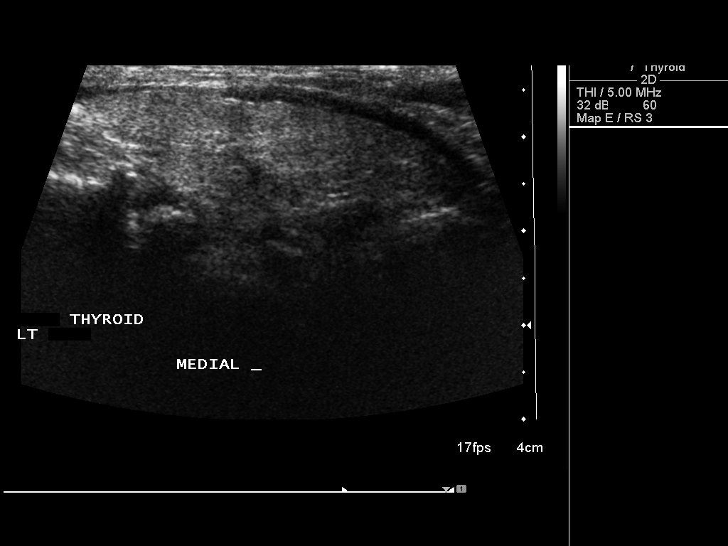
[im 56/56]
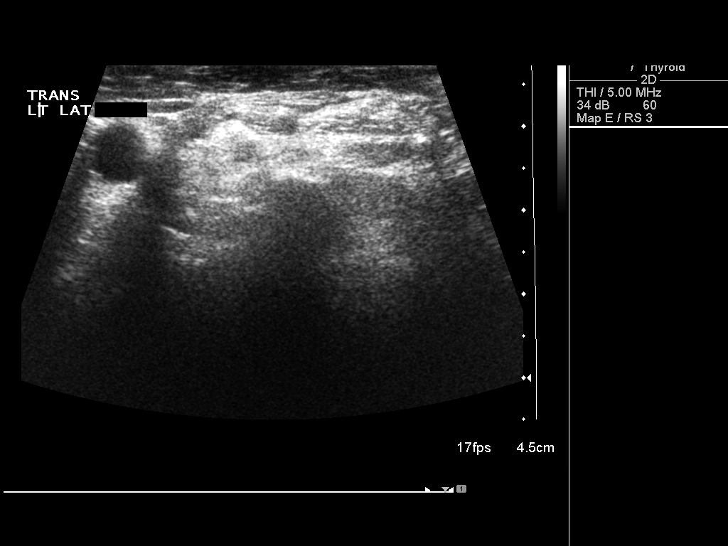

[14 of 25 positions shown; findings below may reference images not displayed]

FINDINGS: Right thyroid lobe

Measurements: 4.9 x 2.5 x 2.4 cm. Heterogeneous tissue. 2.8 x 2.2 x
2.3 cm complex solid and cystic lesion in the lower pole.

Left thyroid lobe

Measurements: 4.5 x 1.3 x 1.6 cm. Heterogeneous tissue. Very small
left-sided nodules measuring 6 mm or less.

Isthmus

Thickness: 3 mm.  No nodules visualized.

Lymphadenopathy

None visualized.
IMPRESSION: Bilateral nodules. Dominant complex right lower pole nodule measures
2.8 cm. Findings meet consensus criteria for biopsy.
Ultrasound-guided fine needle aspiration should be considered, as
per the consensus statement: Management of Thyroid Nodules Detected
at US: Society of Radiologists in Ultrasound Consensus Conference

## 2016-02-07 ENCOUNTER — Encounter: Payer: Self-pay | Admitting: Cardiology

## 2016-02-07 ENCOUNTER — Ambulatory Visit (INDEPENDENT_AMBULATORY_CARE_PROVIDER_SITE_OTHER): Payer: Medicare Other | Admitting: Cardiology

## 2016-02-07 VITALS — BP 110/68 | HR 56 | Ht 72.0 in | Wt 131.0 lb

## 2016-02-07 DIAGNOSIS — I350 Nonrheumatic aortic (valve) stenosis: Secondary | ICD-10-CM | POA: Diagnosis not present

## 2016-02-07 DIAGNOSIS — I480 Paroxysmal atrial fibrillation: Secondary | ICD-10-CM | POA: Diagnosis not present

## 2016-02-07 DIAGNOSIS — I6523 Occlusion and stenosis of bilateral carotid arteries: Secondary | ICD-10-CM

## 2016-02-07 DIAGNOSIS — E785 Hyperlipidemia, unspecified: Secondary | ICD-10-CM | POA: Diagnosis not present

## 2016-02-07 NOTE — Patient Instructions (Signed)
Medication Instructions:  Please stop your Atorvastatin. Continue all other medications as listed.  Follow-Up: Follow up in 6 months with Dr. Marlou Porch.  You will receive a letter in the mail 2 months before you are due.  Please call us when you receive this letter to schedule your follow up appointment.  If you need a refill on your cardiac medications before your next appointment, please call your pharmacy.  Thank you for choosing [facility]!!

## 2016-02-07 NOTE — Progress Notes (Signed)
Cardiology Office Note   Date:  02/07/2016   ID:  Eddie [name], DOB Sep 10, 1932, MRN [id number]  PCP:  Eddie Covert, MD  Cardiologist:  Dr. Marlou [name] (Former Dr. Claiborne [name])       History of Present Illness: Eddie [name] is a 82 y.o. male who presents for follow-up. He was last seen by Dr. Claiborne [name] on 05/14/14. This was hospital follow-up for syncope. In review of Dr. Lucy [name] note, he has a history of coronary artery disease, PCI with 3 stent implantation in [place] in 2009. Peripheral vascular disease as well with left carotid endarterectomy. He moved to [place] from [place]. He has a history of paroxysmal atrial fibrillation as well as aortic stenosis. In December 2015 he experienced syncope while on per the axilla and had fractured ribs and developed hemothorax requiring chest tube insertion. He was readmitted a few weeks ago after significant fall at home. Blood pressure dropped to 66. Aortic stenosis was noted with mean gradient of 44 mmHg consistent with severe aortic stenosis. It was thought that his presyncope/syncope was most likely related to a combination of aortic valve stenosis as well as significant orthostatic hypotension. Dr. Claiborne [name] had lengthy discussion with his wife, daughter, family. Aortic valve replacement including TAVR therapy was discussed. He does however have significant dementia and has been on Aricept and Namenda. He also been on low-dose sotalol 40 mg twice a day without recurrence of atrial fibrillation. QTC was 477 ms.  Carotid artery disease 40-59% right ICA stenosis, 39 on left.  He was sent home on Pradaxa 150 mg twice a day.  Dr. Claiborne [name] had significant concerns about fall risks especially with 2 falls in December 1 leading to hemothorax. Issues noted with orthostatic hypotension. Prior office visit showed systolic of 0000000 down to [id number] with standing. Heart rate also increased from 61-91. He had lengthy discussion about risks and benefits of  anticoagulation therapy. He felt as though it was prudent to discontinue anticoagulation and continue low-dose of sotalol and aspirin alone. He also had lengthy discussion regarding recommendations with aortic valve and plan seems to be that of conservative approach rather than consideration of future surgery. Support stockings were recommended. Monitor was also begun.  CardioNet monitor was placed from 05/13/14 through 06/11/14. There are occasional symptoms of dizziness however this correlated with sinus rhythm/mild sinus bradycardia. He has had occasional sinus bradycardia at night in the 30s, asymptomatic. He did have a few episodes of 4 beats of nonsustained ventricular tachycardia mostly in the early morning hours. No sustained episodes. He also had one episode of what looks like paroxysmal atrial tachycardia at heart rate of 120 bpm for proximally 15 beats. Asymptomatic. NO AFIB.   10/01/14-overall doing well with no change in symptoms. Tolerating his severe aortic stenosis. No signs of heart failure. No syncope. Eddie [name], his daughter who works for [employer], [employer].  08/03/15-no change in symptoms. No heart failure, no syncope, no shortness of breath. Dementia is fairly advanced at this point. No recent falls. Here with his wife and caregiver. His wife has significant lower extremity edema.  02/07/16-weight loss has taken place. Perhaps 20-30 pounds. This came as quite a surprise to his daughter. Maybe he is not eating as much ensure as he used to because of diarrhea. He is still denying any chest pain, syncope, shortness of breath. No classic symptoms of severe aortic stenosis.  Past Medical History:  Diagnosis Date  . A-fib ([place])   . CAD (coronary artery disease)  s/p PCI x 3 (09/2007)  . Carotid artery stenosis    left  . Dementia   . HTN (hypertension)   . Memory loss   . Pneumothorax 02/18/2014  . Sciatica   . Syncope and collapse   . Thrombocytopenia ([place])   . Thyroid mass   . Vision  abnormalities     Past Surgical History:  Procedure Laterality Date  . ACHILLES TENDON REPAIR Left   . CAROTID ENDARTERECTOMY    . CATARACT EXTRACTION    . CHEST TUBE INSERTION Right 02/18/2014  . CORONARY ANGIOPLASTY WITH STENT PLACEMENT    . CORONARY STENT PLACEMENT  2009  . HERNIA REPAIR    . INGUINAL HERNIA REPAIR    . TONSILLECTOMY       Current Outpatient Prescriptions  Medication Sig Dispense Refill  . aspirin EC 81 MG tablet Take 81 mg by mouth daily.    Marland Kitchen b complex vitamins tablet Take 1 tablet by mouth daily.    . cholecalciferol (VITAMIN D) 1000 UNITS tablet Take 1,000 Units by mouth daily.    . feeding supplement, ENSURE COMPLETE, (ENSURE COMPLETE) LIQD Take 237 mLs by mouth 2 (two) times daily between meals.    . ferrous sulfate 325 (65 FE) MG tablet Take 325 mg by mouth daily with breakfast. 3 x a week    . gabapentin (NEURONTIN) 300 MG capsule Take 300 mg by mouth at bedtime.    Marland Kitchen loratadine (CLARITIN) 10 MG tablet Take 10 mg by mouth daily.    . magnesium oxide (MAG-OX) 400 MG tablet Take 400 mg by mouth daily.    . metroNIDAZOLE (METROGEL) 1 % gel Apply 1 application topically 2 (two) times daily as needed (rosacea). Reported on 10/06/2015    . NAMZARIC 28-10 MG CP24 Take 28 mg by mouth daily. 30 capsule 6  . sotalol (BETAPACE) 80 MG tablet Take 40 mg by mouth 2 (two) times daily.     No current facility-administered medications for this visit.     Allergies:   Patient has no known allergies.    Social History:  The patient  reports that he has never smoked. He has never used smokeless tobacco. He reports that he drinks alcohol. He reports that he does not use drugs.   Family History:  The patient's family history includes Diabetes in his father; Heart disease in his mother.    ROS:  Please see the history of present illness.   Otherwise, review of systems are positive for falls, dementia, feeling cold (TSH was normal in hospital)..   All other systems are  reviewed and negative.    PHYSICAL EXAM: VS:  BP 110/68   Pulse (!) 56   Ht 6' (1.829 m)   Wt 131 lb (59.4 kg)   BMI 17.77 kg/m  , BMI Body mass index is 17.77 kg/m. GEN: Thin, elderly, in no acute distress HEENT: normal Neck: no JVD, + bialt carotid bruits/ radiation of aortic murmur, no masses Cardiac: RRR; 3/6 musical systolic murmur,no  rubs, or gallops,no edema  Respiratory:  clear to auscultation bilaterally, normal work of breathing GI: soft, nontender, nondistended, + BS MS: left hand deformity  Skin: warm and dry, no rash Neuro:  MAE, mildly frail. Ambulates slowly.  Psych: euthymic mood, dementia noted.    EKG:  EKG is not ordered today.   Recent Labs: 11/24/2015: BUN 18; Creat 1.11; Magnesium 2.1; Potassium 3.6; Sodium 143 12/08/2015: Hemoglobin 12.4; Platelets 186; TSH 0.86    Lipid Panel  No results found for: CHOL, TRIG, HDL, CHOLHDL, VLDL, LDLCALC, LDLDIRECT    Wt Readings from Last 3 Encounters:  02/07/16 131 lb (59.4 kg)  12/29/15 154 lb 3.2 oz (69.9 kg)  12/08/15 155 lb (70.3 kg)      Other studies Reviewed: Additional studies/ records that were reviewed today include: Prior records, hospital, labs. Review of the above records demonstrates: as above.  Event Monitor 3/16 NSR, several episodes of marked bradycardia, episode of PSVT and 2 episodes of 4 beats NSVT  Carotid US 2/16 R 40-59% ICA L 1-39% ICA  Echo 12/15 Mild LVH, EF [id number], grade 1 diastolic dysfunction, severe AS (peak 70 mmHg, mean 44 mmHg), mild AI, mild MR, mild LAE, PASP 41 mmHg  ASSESSMENT AND PLAN:   Aortic stenosis-severe. Agree with conservative management giving other comorbidities, dementia. Family is in agreement. Discussed possibilities of worsening heart failure, arrhythmic episode. No significant change in symptoms.He has lost weight but he is not feeling any dyspnea i.e. no other indication of congestive symptoms.  Paroxysmal atrial fibrillation-no evidence of atrial  fibrillation on 30 day event monitor. Brief PAT. We will continue with low-dose sotalol. Hopefully this will keep atrial fibrillation in check. This seems to be working at this point. I explained to them that if he did go into atrial fibrillation, this could potentiate worsening symptoms with his aortic stenosis. They understand risk of potential embolic stroke if atrial fibrillation occurs. Only on aspirin. CHADS2-VASc=4  Nonsustained ventricular tachycardia-2 separate episodes, 4 beats each of nonsustained ventricle tachycardia was noted on event monitor. Continue with current management strategy. Conservative.  Orthostatic hypotension-compression hose. Salt liberalization. Hydration. Agree with trying to maintain higher than normal blood pressure. They provided me with several readings from home most of which are demonstrating quite significant orthostatic hypotension. Be careful especially when getting out of bed. We discussed this. Falls are the main reason why we are no longer on anticoagulation. Lengthy discussion again today. He is wearing compression hose  Dementia-Namenda, Aricept. Fairly advanced. They have 24-hour help at home. They live in apartments next to her's teeter on [address]. Avoid outdoor steps. Dr. Felipa Eth has been seen.  Hyperlipidemia-I am fine with him coming off of this medication, atorvastatin 40 mg. We will stop. I agree with Dr. Erin Hearing  Carotid stenosis-prior left sided carotid stent x 3. Aspirin, statin.   Disposition:   FU with [facility] in 6 months   Signed, Candee Furbish, MD  02/07/2016 9:50 AM    [facility] [place], [place], [place]  [address] Phone: [phone]; Fax: [phone]

## 2016-04-12 ENCOUNTER — Encounter: Payer: Self-pay | Admitting: Family Medicine

## 2016-04-15 MED ORDER — GABAPENTIN 300 MG PO CAPS: 300.0000 mg | ORAL_CAPSULE | Freq: Every day | ORAL | 2 refills | Status: DC

## 2016-05-31 ENCOUNTER — Ambulatory Visit (INDEPENDENT_AMBULATORY_CARE_PROVIDER_SITE_OTHER): Payer: Medicare Other | Admitting: Family Medicine

## 2016-05-31 DIAGNOSIS — F0390 Unspecified dementia without behavioral disturbance: Secondary | ICD-10-CM

## 2016-05-31 DIAGNOSIS — R5381 Other malaise: Secondary | ICD-10-CM

## 2016-05-31 DIAGNOSIS — E785 Hyperlipidemia, unspecified: Secondary | ICD-10-CM | POA: Diagnosis not present

## 2016-05-31 NOTE — Progress Notes (Signed)
Subjective  Patient is presenting with the following illnesses  Dementia Family feels is about the same.  He continues to sleep a lot but is interactive when engaged. No falls.  No significant behavioral problems.  Taking medications without stomach upset or sedation.  Cholesterol No side effects from stopping lipitor.   The think he may be eating better  Fatigue Seems somewhat improved at least not worsening.  He will walk and be active when stimulated.  No weight loss (prior weight likely in error) or fever or bleeding   EOL Wishes Wife is HCPOA.  The do not wish CPR.   Do not have a MOST form   Chief Complaint noted Review of Symptoms - see HPI PMH - Smoking status noted.     Objective Vital Signs reviewed Alert  Speaks minimally but pleasant    Assessments/Plans  No problem-specific Assessment & Plan notes found for this encounter.   See Encounter view if individual problem A/Ps not visible See after visit summary for details of patient instuctions

## 2016-05-31 NOTE — Assessment & Plan Note (Signed)
Stable Statin not indicated given dementia

## 2016-05-31 NOTE — Patient Instructions (Signed)
Good to see you  Complete the MOST form  Come back if you have problems or in one year

## 2016-05-31 NOTE — Assessment & Plan Note (Signed)
Improved.  Seems back to baseline.  Likely dementia related

## 2016-05-31 NOTE — Assessment & Plan Note (Signed)
Stable continue current medications

## 2016-08-15 ENCOUNTER — Encounter: Payer: Self-pay | Admitting: Family Medicine

## 2016-08-15 DIAGNOSIS — F0390 Unspecified dementia without behavioral disturbance: Secondary | ICD-10-CM

## 2016-08-23 ENCOUNTER — Telehealth: Payer: Self-pay | Admitting: Neurology

## 2016-08-23 NOTE — Telephone Encounter (Signed)
Ok with me 

## 2016-08-23 NOTE — Telephone Encounter (Signed)
That's fine. thanks

## 2016-08-23 NOTE — Telephone Encounter (Signed)
Dr Felecia Shelling,  You saw this patient 2016 and he has been referred back to [place]. He wants to see Dr Jaynee Eagles. Is it ok to switch? Please advise. Thanks Diane

## 2016-08-23 NOTE — Telephone Encounter (Signed)
Dr Jaynee Eagles,  This patient saw Dr Felecia Shelling 2016 and is now being referred back. He wants to see you and not Dr Felecia Shelling. Is it ok to switch? Please advise. Thanks Diane

## 2016-08-24 NOTE — Telephone Encounter (Signed)
Completed close out °

## 2016-08-28 ENCOUNTER — Encounter: Payer: Self-pay | Admitting: Cardiology

## 2016-09-02 ENCOUNTER — Other Ambulatory Visit: Payer: Self-pay | Admitting: Family Medicine

## 2016-09-06 ENCOUNTER — Other Ambulatory Visit: Payer: Self-pay | Admitting: *Deleted

## 2016-09-06 MED ORDER — SOTALOL HCL 80 MG PO TABS: 40.0000 mg | ORAL_TABLET | Freq: Two times a day (BID) | ORAL | 1 refills | Status: DC

## 2016-09-14 ENCOUNTER — Ambulatory Visit: Payer: Medicare Other | Admitting: Cardiology

## 2016-10-18 ENCOUNTER — Ambulatory Visit (INDEPENDENT_AMBULATORY_CARE_PROVIDER_SITE_OTHER): Payer: Medicare Other | Admitting: Neurology

## 2016-10-18 ENCOUNTER — Encounter: Payer: Self-pay | Admitting: Neurology

## 2016-10-18 VITALS — BP 125/71 | HR 69 | Ht 72.0 in | Wt 148.8 lb

## 2016-10-18 DIAGNOSIS — F0390 Unspecified dementia without behavioral disturbance: Secondary | ICD-10-CM

## 2016-10-18 DIAGNOSIS — R269 Unspecified abnormalities of gait and mobility: Secondary | ICD-10-CM

## 2016-10-18 DIAGNOSIS — W19XXXA Unspecified fall, initial encounter: Secondary | ICD-10-CM

## 2016-10-18 MED ORDER — SERTRALINE HCL 25 MG PO TABS: 25.0000 mg | ORAL_TABLET | Freq: Every day | ORAL | 5 refills | Status: DC

## 2016-10-18 NOTE — Patient Instructions (Signed)
Remember to drink plenty of fluid, eat healthy meals and do not skip any meals. Try to eat protein with a every meal and eat a healthy snack such as fruit or nuts in between meals. Try to keep a regular sleep-wake schedule and try to exercise daily, particularly in the form of walking, 20-30 minutes a day, if you can.   As far as your medications are concerned, I would like to suggest: Continue Current Medications  I would like to see you back in 1 year, sooner if we need to. Please call us with any interim questions, concerns, problems, updates or refill requests.   Our phone number is [phone]. We also have an after hours call service for urgent matters and there is a physician on-call for urgent questions. For any emergencies you know to call 911 or go to the nearest emergency room

## 2016-10-18 NOTE — Progress Notes (Signed)
[facility]    Provider:  Dr Jaynee Eagles Referring Provider: Lind Covert, * Primary Care Physician:  Lind Covert, MD  CC:  Dementia  HPI:  Eddie [name] is a 83 y.o. male here as a referral from Dr. Erin Hearing for Dementia. He has a past medical history of carotid disease (left carotid endarterectomy),coronary artery disease (PCI with 3 stent implantation in [place] 2009), peripheral vascular disease, orthostatic syncope, paroxysmal atrial fibrillation, dementia, dyslipidemia, aortic stenosis. Patient was diagnosed with dementia in 2014 and it has been slowly progressive. The patient is "holding his own" her family . Wife and knee surgery here who provide most information.  He went to [place] from [place]. He has a caretaker 24 hours a day. He sleeps all night and sleeps a lot during the day. His sleeping at night his however fine, he does not wander despite napping during the day. His memory loss is progressing, he doesn't know the day of the week, doesn't recognize friends or family, sometimes he asks for his wife when she is there. No aggressive behavior, he gets frustrated but no agitation. No recent falls since he has 24 hour care. Appetite is good. No choking on food.  He can bathe himself, transfer, toilet, feed himself. He was diagnosed with dementia in 2014, was driving until 2956.  He has some depression. He has been on Aricept and Namenda. No other focal neurologic deficits, associated symptoms, inciting events or modifiable factors.  Reviewed notes, labs and imaging from outside physicians, which showed:  COMPARISON:  03/10/2014  CT of the head 04/30/2014 (personally reviewed images and agree with the following): Diffuse cerebral atrophy. Ventricular dilatation consistent with central atrophy. Low-attenuation changes in the deep white matter consistent with small vessel ischemia. No mass effect or midline shift. No abnormal  extra-axial fluid collections. Gray-white matter junctions are distinct. Basal cisterns are not effaced. No evidence of acute intracranial hemorrhage. No depressed skull fractures. Visualized paranasal sinuses and mastoid air cells are not opacified. Vascular calcifications.  IMPRESSION: No acute intracranial abnormalities. Chronic atrophy and small vessel ischemic changes. Similar appearance to prior study.  CBC 12/08/2015 unremarkable with mild anemia hemoglobin 12.4, TSH normal, magnesium normal, glucose slightly elevated otherwise normal BMP  Review of Systems: Patient complains of symptoms per HPI as well as the following symptoms: Fatigue, fevers, no chills, no weight loss. Pertinent negatives and positives per HPI. All others negative.   Social History   Social History  . Marital status: Married    Spouse name: N/A  . Number of children: 0  . Years of education: College   Occupational History  . Retired    Social History Main Topics  . Smoking status: Never Smoker  . Smokeless tobacco: Never Used  . Alcohol use No     Comment: Quit in 2014  . Drug use: No  . Sexual activity: Not on file   Other Topics Concern  . Not on file   Social History Narrative   Married - wife has chronic gait problems   No children but nieces and nephews and friends - [name] [employer]   Recently moved to [place] 01/2014 to be closer to above    Denies h/o smoking, caffeine    Lives at home with wife at [employer] - has 24 hour care       Family History  Problem Relation Age of Onset  . Heart disease Mother   . Diabetes Father  Past Medical History:  Diagnosis Date  . A-fib ([place])   . CAD (coronary artery disease)    s/p PCI x 3 (09/2007)  . Carotid artery stenosis    left  . Dementia   . HTN (hypertension)   . Memory loss   . Pneumothorax 02/18/2014  . Sciatica   . Syncope and collapse   . Thrombocytopenia ([place])   . Thyroid mass   . Vision  abnormalities     Past Surgical History:  Procedure Laterality Date  . ACHILLES TENDON REPAIR Left   . CAROTID ENDARTERECTOMY    . CATARACT EXTRACTION    . CHEST TUBE INSERTION Right 02/18/2014  . CORONARY ANGIOPLASTY WITH STENT PLACEMENT    . CORONARY STENT PLACEMENT  2009  . HERNIA REPAIR    . INGUINAL HERNIA REPAIR    . TONSILLECTOMY      Current Outpatient Prescriptions  Medication Sig Dispense Refill  . aspirin EC 81 MG tablet Take 81 mg by mouth daily.    Marland Kitchen b complex vitamins tablet Take 1 tablet by mouth daily.    . cholecalciferol (VITAMIN D) 1000 UNITS tablet Take 1,000 Units by mouth daily.    . feeding supplement, ENSURE COMPLETE, (ENSURE COMPLETE) LIQD Take 237 mLs by mouth 2 (two) times daily between meals.    . ferrous sulfate 325 (65 FE) MG tablet Take 325 mg by mouth daily with breakfast. 3 x a week    . fluticasone (FLONASE) 50 MCG/ACT nasal spray Place into both nostrils daily.    Marland Kitchen loratadine (CLARITIN) 10 MG tablet Take 10 mg by mouth daily.    . magnesium oxide (MAG-OX) 400 MG tablet Take 400 mg by mouth daily.    . metroNIDAZOLE (METROGEL) 1 % gel Apply 1 application topically 2 (two) times daily as needed (rosacea). Reported on 10/06/2015    . NAMZARIC 28-10 MG CP24 TAKE ONE CAPSULE BY MOUTH DAILY 30 capsule 5  . sotalol (BETAPACE) 80 MG tablet Take 0.5 tablets (40 mg total) by mouth 2 (two) times daily. 90 tablet 1  . gabapentin (NEURONTIN) 300 MG capsule Take 1 capsule (300 mg total) by mouth at bedtime. 90 capsule 2  . sertraline (ZOLOFT) 25 MG tablet Take 1 tablet (25 mg total) by mouth daily. 90 tablet 5   No current facility-administered medications for this visit.     Allergies as of 10/18/2016  . (No Known Allergies)    Vitals: BP 125/71   Pulse 69   Ht 6' (1.829 m)   Wt 148 lb 12.8 oz (67.5 kg)   BMI 20.18 kg/m  Last Weight:  Wt Readings from Last 1 Encounters:  10/18/16 148 lb 12.8 oz (67.5 kg)   Last Height:   Ht Readings from Last 1  Encounters:  10/18/16 6' (1.829 m)    MMSE - Mini Mental State Exam 10/18/2016  Not completed: Unable to complete   Physical exam: Exam: Gen: NAD, not conversant                  CV: RRR, +SEM. +Carotid Bruits. No peripheral edema, warm, nontender Eyes: Conjunctivae clear without exudates or hemorrhage  Neuro: Detailed Neurologic Exam  Speech:    Without aphasia with paucity of speech Cognition:     The patient is oriented to person only    recent and remote memory impaired;     language without dysarthria or aphasia;     Impaired attention, concentration,  fund of knowledge Cranial Nerves:  The pupils are equal, round, and reactive to light. Attempted funduscopic exam could not visualize. Blinks to threat.. Mildly impaired upgaze without smooth pursuit.  Trigeminal sensation is intact and the muscles of mastication are normal. The face is symmetric. The palate elevates in the midline. Hearing intact to voice. Voice is normal. Shoulder shrug is normal. The tongue has normal motion without fasciculations.   Coordination: No dysmetria  Gait:  Bradykinetic with walking aids  Motor Observation:  no involuntary movements noted. Tone:    Normal muscle tone.    Posture:    Posture is normal. normal erect    Strength:    Strength is equal in the upper and lower limbs.      Sensation: intact to LT     Reflex Exam:  DTR's:    Deep tendon reflexes in the upper and lower extremities are symmetrical bilaterally.   Toes:    The toes are equivocal bilaterally.   Clonus:    Clonus is absent.     Assessment/Plan:   83 y.o. male here as a referral from Dr. Erin Hearing for Dementia. He has a past medical history of carotid disease (left carotid endarterectomy),coronary artery disease (PCI with 3 stent implantation in [place] 2009), peripheral vascular disease, orthostatic syncope, paroxysmal atrial fibrillation, dementia, dyslipidemia, aortic stenosis. Patient was  diagnosed with dementia in 2014 and it has been slowly progressive   Changing apartments, now with stairs. He is a significant fall risk. Discussed fall precautions. We'll order physical therapy and home to assist with gait evaluation and safety, use of walking aids, home safety evaluation, stairs and in transfer training. Continue Aricept and Namenda Today's history and physical demonstrated very substantial and measurable cognitive losses consistent with dementia. It is clear that he does not have the capacity to make an informed and appropriate decisions on healthcare and finances. I do recommend that he have 24 hour care or in a structured setting. Had a long discussion with family. They have a very supportive family and patient is very well taken care of. Follow-up in 1 year or sooner if needed  CC: Dr. Erin Hearing  Orders Placed This Encounter  Procedures  . Ambulatory referral to [place], [facility] [address] [place],  [address]  Phone [phone] Fax [phone]

## 2016-10-26 DIAGNOSIS — R269 Unspecified abnormalities of gait and mobility: Secondary | ICD-10-CM | POA: Diagnosis not present

## 2016-10-26 DIAGNOSIS — F0390 Unspecified dementia without behavioral disturbance: Secondary | ICD-10-CM | POA: Diagnosis not present

## 2016-10-27 DIAGNOSIS — F0390 Unspecified dementia without behavioral disturbance: Secondary | ICD-10-CM | POA: Diagnosis not present

## 2016-10-27 DIAGNOSIS — R269 Unspecified abnormalities of gait and mobility: Secondary | ICD-10-CM | POA: Diagnosis not present

## 2016-10-30 ENCOUNTER — Telehealth: Payer: Self-pay | Admitting: Neurology

## 2016-10-30 DIAGNOSIS — R269 Unspecified abnormalities of gait and mobility: Secondary | ICD-10-CM | POA: Diagnosis not present

## 2016-10-30 DIAGNOSIS — F0390 Unspecified dementia without behavioral disturbance: Secondary | ICD-10-CM | POA: Diagnosis not present

## 2016-10-30 NOTE — Telephone Encounter (Signed)
[facility] [phone] called the clinic to let frequency for PT 1 x1 then 2 x 4.  FYI

## 2016-10-30 NOTE — Telephone Encounter (Signed)
Returned TC and gave verbal approval for requested PT frequency.

## 2016-10-31 DIAGNOSIS — R269 Unspecified abnormalities of gait and mobility: Secondary | ICD-10-CM | POA: Diagnosis not present

## 2016-10-31 DIAGNOSIS — F0390 Unspecified dementia without behavioral disturbance: Secondary | ICD-10-CM | POA: Diagnosis not present

## 2016-11-01 NOTE — Telephone Encounter (Signed)
Called [facility] back and gave VO for OT eval. Verbalized understanding and appreciation for call. Will fax orders to be signed by MD.

## 2016-11-01 NOTE — Telephone Encounter (Signed)
RN Agapito Games from [facility] called stating pt does not want occupational therapy but the family wants it for him .She is wanting an evaluation done for Occupational therapy, please call [phone]

## 2016-11-03 ENCOUNTER — Encounter: Payer: Self-pay | Admitting: Cardiology

## 2016-11-03 DIAGNOSIS — R269 Unspecified abnormalities of gait and mobility: Secondary | ICD-10-CM | POA: Diagnosis not present

## 2016-11-03 DIAGNOSIS — F0390 Unspecified dementia without behavioral disturbance: Secondary | ICD-10-CM | POA: Diagnosis not present

## 2016-11-06 ENCOUNTER — Telehealth: Payer: Self-pay | Admitting: *Deleted

## 2016-11-06 DIAGNOSIS — F0390 Unspecified dementia without behavioral disturbance: Secondary | ICD-10-CM | POA: Diagnosis not present

## 2016-11-06 DIAGNOSIS — R269 Unspecified abnormalities of gait and mobility: Secondary | ICD-10-CM | POA: Diagnosis not present

## 2016-11-06 NOTE — Telephone Encounter (Signed)
Eddie [name] mentioned Friday evening that his "heart hurts". He does not semester to be in distress. Could this be related to his aortic stenosis? Might his medication need to be adjusted? Our next appointment is not until October.    [name] (Niece)   Copied from Patient Advice Request sent to Dr Marlou Porch 11/03/16 at 9:51 PM.   11/06/16

## 2016-11-06 NOTE — Telephone Encounter (Signed)
I spoke with patient's niece, Eddie [name].  Eddie [name] states visiting patient Friday evening, he pointed to his sternum/chest and said his chest hurt. Eddie [name] states pt has Alzheimer's and he does not communicate very well, she was unable to get any more information from the patient. Eddie [name] states after discussing with pt's wife, wife states it may have been indigestion. Eddie [name] states since Friday evening, pt has not mentioned any more chest pain, has slept over the weekend Eddie [name] states pt has not complained of shortness of breath, lightheadedness, dizziness , lower extremity edema or other symptoms. I offered to schedule patient to come to office to see APP in the next day or so. Eddie [name] declined, prefers to keep Oct 2 appointment with Dr Marlou Porch, I was unable to find sooner availability on Dr Marlou Porch' schedule. Eddie [name] states family will monitor pt symptoms and call if changes.

## 2016-11-06 NOTE — Telephone Encounter (Signed)
I will call pt's niece.

## 2016-11-07 DIAGNOSIS — R269 Unspecified abnormalities of gait and mobility: Secondary | ICD-10-CM | POA: Diagnosis not present

## 2016-11-07 DIAGNOSIS — F0390 Unspecified dementia without behavioral disturbance: Secondary | ICD-10-CM | POA: Diagnosis not present

## 2016-11-07 NOTE — Telephone Encounter (Signed)
Eddie [name] with Eddie [name] is calling. Patient evaluated 11-03-16 for OT, evaluation only and no further visits. Program for exercise and activity in play. A returned call is not needed unless there are questions.

## 2016-11-10 DIAGNOSIS — R269 Unspecified abnormalities of gait and mobility: Secondary | ICD-10-CM | POA: Diagnosis not present

## 2016-11-10 DIAGNOSIS — F0390 Unspecified dementia without behavioral disturbance: Secondary | ICD-10-CM | POA: Diagnosis not present

## 2016-11-13 DIAGNOSIS — R269 Unspecified abnormalities of gait and mobility: Secondary | ICD-10-CM | POA: Diagnosis not present

## 2016-11-13 DIAGNOSIS — F0390 Unspecified dementia without behavioral disturbance: Secondary | ICD-10-CM | POA: Diagnosis not present

## 2016-11-17 ENCOUNTER — Telehealth: Payer: Self-pay | Admitting: *Deleted

## 2016-11-17 DIAGNOSIS — F0390 Unspecified dementia without behavioral disturbance: Secondary | ICD-10-CM | POA: Diagnosis not present

## 2016-11-17 DIAGNOSIS — R269 Unspecified abnormalities of gait and mobility: Secondary | ICD-10-CM | POA: Diagnosis not present

## 2016-11-17 NOTE — Telephone Encounter (Signed)
Faxed signed POC back to [facility]. Fax: [phone]. Received confirmation.

## 2016-11-20 DIAGNOSIS — R269 Unspecified abnormalities of gait and mobility: Secondary | ICD-10-CM | POA: Diagnosis not present

## 2016-11-20 DIAGNOSIS — F0390 Unspecified dementia without behavioral disturbance: Secondary | ICD-10-CM | POA: Diagnosis not present

## 2016-11-22 ENCOUNTER — Other Ambulatory Visit: Payer: Self-pay | Admitting: Family Medicine

## 2016-11-22 MED ORDER — FLUTICASONE PROPIONATE 50 MCG/ACT NA SUSP: 1.0000 | Freq: Every day | NASAL | 11 refills | Status: AC

## 2016-11-22 NOTE — Progress Notes (Signed)
Subjective  Patient is presenting with the following illnesses     Chief Complaint noted Review of Symptoms - see HPI PMH - Smoking status noted.     Objective Vital Signs reviewed     Assessments/Plans  No problem-specific Assessment & Plan notes found for this encounter.   See Encounter view if individual problem A/Ps not visible See after visit summary for details of patient instuctions 

## 2016-11-24 DIAGNOSIS — F0390 Unspecified dementia without behavioral disturbance: Secondary | ICD-10-CM | POA: Diagnosis not present

## 2016-11-24 DIAGNOSIS — R269 Unspecified abnormalities of gait and mobility: Secondary | ICD-10-CM | POA: Diagnosis not present

## 2016-11-28 ENCOUNTER — Encounter: Payer: Self-pay | Admitting: Family Medicine

## 2016-11-28 ENCOUNTER — Ambulatory Visit (INDEPENDENT_AMBULATORY_CARE_PROVIDER_SITE_OTHER): Payer: Medicare Other | Admitting: Family Medicine

## 2016-11-28 VITALS — BP 120/64 | HR 66 | Temp 98.2°F | Wt 149.2 lb

## 2016-11-28 DIAGNOSIS — L89322 Pressure ulcer of left buttock, stage 2: Secondary | ICD-10-CM

## 2016-11-28 DIAGNOSIS — L8992 Pressure ulcer of unspecified site, stage 2: Secondary | ICD-10-CM | POA: Insufficient documentation

## 2016-11-28 DIAGNOSIS — R399 Unspecified symptoms and signs involving the genitourinary system: Secondary | ICD-10-CM | POA: Diagnosis not present

## 2016-11-28 LAB — POCT URINALYSIS DIP (MANUAL ENTRY)
Blood, UA: NEGATIVE
Glucose, UA: NEGATIVE mg/dL
Leukocytes, UA: NEGATIVE
Nitrite, UA: NEGATIVE
PH UA: 5.5 (ref 5.0–8.0)
Protein Ur, POC: NEGATIVE mg/dL
Spec Grav, UA: 1.025 (ref 1.010–1.025)
Urobilinogen, UA: 0.2 E.U./dL

## 2016-11-28 MED ORDER — DUODERM CGF DRESSING EX MISC: 1.0000 | CUTANEOUS | 3 refills | Status: AC | PRN

## 2016-11-28 NOTE — Assessment & Plan Note (Addendum)
Acute. Stage 2 pressure injury. No signs of infection. Afebrile. Patient does have depressed cognition per wife and caregiver yet UA clean. Given Tegaderm in office.  --Duoderm patch prescribed and to be placed q3 days on affected area --Recommended frequent repositioning and promote ambulation --RTC in 2 weeks if activity level does not improve

## 2016-11-28 NOTE — Progress Notes (Signed)
   Subjective:   Patient ID: Eddie [name]    DOB: 1932-07-26, 83 y.o. male   MRN: [id number]  CC: "Pressure wound"  HPI: Eddie [name] is a 83 y.o. male who presents to [place] fro pressure wound. Problems discussed today are as follows:  Left ischial pressure injury: History obtained from wife and caregiver. Pressure injury on left buttocks. Noticed 1 week ago. Has become more open now. No discharge or bleeding. Currently lives at the [facility]. No history of previous pressure injuries. Has ben less active over past week. ROS: Denies fevers/chills, nausea or vomiting.  Complete ROS performed, see HPI for pertinent.  [place]: PAF (sotalol and ASA), severe AS, dementia, HLD, thyroid mass. Surgical history tonsillectomy, hernia repair, cath w/ stent x1 2009, chest tube 2015, cataract, carotid endarterectomy, achilles-L repair. Family history heart disease, DM. Smoking status reviewed. Medications reviewed.   Objective:   BP 120/64   Pulse 66   Temp 98.2 F (36.8 C) (Oral)   Wt 149 lb 3.2 oz (67.7 kg)   SpO2 96%   BMI 20.24 kg/m  Vitals and nursing note reviewed.  General: well nourished, well developed, in no acute distress with non-toxic appearance HEENT: normocephalic, atraumatic, moist mucous membranes CV: regular rate and rhythm without murmurs, rubs, or gallops, no lower extremity edema Lungs: clear to auscultation bilaterally with normal work of breathing Abdomen: soft, non-tender, non-distended, normoactive bowel sounds Skin: warm, dry, cap refill < 2 seconds, erythematous 5 mm grade 2 pressure injury on left ischium without discharge or fluctuance Extremities: warm and well perfused, normal tone Psych: euthymic mood with flat affect, cognition significantly slowed      Assessment & Plan:   Pressure injury of skin, stage 2 Acute. Stage 2 pressure injury. No signs of infection. Patient does have depressed cognition per wife and caregiver yet UA clean. Given Tegaderm in  office.  --Duoderm patch prescribed and to be placed q3 days on affected area --RTC in 2 weeks if activity level does not improve  Orders Placed This Encounter  Procedures  . POCT urinalysis dipstick   Meds ordered this encounter  Medications  . Control Gel Formula Dressing (DUODERM CGF DRESSING) MISC    Sig: Apply 1 each topically every three (3) days as needed.    Dispense:  20 each    Refill:  [place], [place], PGY-2 11/28/2016 6:07 PM

## 2016-11-28 NOTE — Patient Instructions (Signed)
Thank you for coming in to see [place] today. Please see below to review our plan for today's visit.  1. Place a DuoDerm patch on the affected area and replace every 3 days. Make sure patient is turned regularly and avoids prolonged bedtime exposure. 2. I will obtain a urine sample and treat if infection is present. This is likely the source of his decreased activity. 3. Return to the clinic in 2 weeks if not improved.  Please call the clinic at [phone] if your symptoms worsen or you have any concerns. It was my pleasure to see you. -- Harriet [place], [place], PGY-2

## 2016-11-30 ENCOUNTER — Telehealth: Payer: Self-pay

## 2016-11-30 NOTE — Telephone Encounter (Signed)
Received faxed episode detail report from [facility]. Pt d/c'd 11/24/16 to home/self care. Placed in Dr. Cathren Laine inbox for her review.

## 2016-12-13 ENCOUNTER — Telehealth: Payer: Self-pay | Admitting: *Deleted

## 2016-12-13 NOTE — Telephone Encounter (Signed)
Patient's wife called requesting to speak with PCP. Patient has been having a lot of nasal drainage while eating. Claritin and Flonase are not helping. Medication are given as prescribed. Please give her a call.  Number on file is correct.  Derl Barrow, RN

## 2016-12-14 NOTE — Telephone Encounter (Signed)
recommedn afrin once daily

## 2016-12-19 ENCOUNTER — Encounter: Payer: Self-pay | Admitting: Cardiology

## 2016-12-19 ENCOUNTER — Ambulatory Visit (INDEPENDENT_AMBULATORY_CARE_PROVIDER_SITE_OTHER): Payer: Medicare Other | Admitting: Cardiology

## 2016-12-19 VITALS — BP 120/82 | HR 59 | Ht 71.0 in | Wt 147.0 lb

## 2016-12-19 DIAGNOSIS — E7800 Pure hypercholesterolemia, unspecified: Secondary | ICD-10-CM

## 2016-12-19 DIAGNOSIS — I350 Nonrheumatic aortic (valve) stenosis: Secondary | ICD-10-CM | POA: Diagnosis not present

## 2016-12-19 DIAGNOSIS — E785 Hyperlipidemia, unspecified: Secondary | ICD-10-CM | POA: Diagnosis not present

## 2016-12-19 DIAGNOSIS — I480 Paroxysmal atrial fibrillation: Secondary | ICD-10-CM

## 2016-12-19 DIAGNOSIS — I2510 Atherosclerotic heart disease of native coronary artery without angina pectoris: Secondary | ICD-10-CM | POA: Diagnosis not present

## 2016-12-19 DIAGNOSIS — I6523 Occlusion and stenosis of bilateral carotid arteries: Secondary | ICD-10-CM

## 2016-12-19 NOTE — Patient Instructions (Signed)

## 2016-12-19 NOTE — Progress Notes (Signed)
Cardiology Office Note   Date:  12/19/2016   ID:  Eddie [name], DOB 10-30-1932, MRN [id number]  PCP:  Lind Covert, MD  Cardiologist:  Dr. Marlou Porch (Former Dr. Claiborne Billings)       History of Present Illness: Eddie [name] is a 83 y.o. male who presents for follow-up. He was last seen by Dr. Claiborne Billings on 05/14/14. This was hospital follow-up for syncope. In review of Dr. Lucy Chris note, he has a history of coronary artery disease, PCI with 3 stent implantation in [place] in 2009. Peripheral vascular disease as well with left carotid endarterectomy. He moved to [place] from [place]. He has a history of paroxysmal atrial fibrillation as well as aortic stenosis. In December 2015 he experienced syncope while on per the axilla and had fractured ribs and developed hemothorax requiring chest tube insertion. He was readmitted a few weeks ago after significant fall at home. Blood pressure dropped to 66. Aortic stenosis was noted with mean gradient of 44 mmHg consistent with severe aortic stenosis. It was thought that his presyncope/syncope was most likely related to a combination of aortic valve stenosis as well as significant orthostatic hypotension. Dr. Claiborne Billings had lengthy discussion with his wife, daughter, family. Aortic valve replacement including TAVR therapy was discussed. He does however have significant dementia and has been on Aricept and Namenda. He also been on low-dose sotalol 40 mg twice a day without recurrence of atrial fibrillation. QTC was 477 ms.  Carotid artery disease 40-59% right ICA stenosis, 39 on left.  He was sent home on Pradaxa 150 mg twice a day.  Dr. Claiborne Billings had significant concerns about fall risks especially with 2 falls in December 1 leading to hemothorax. Issues noted with orthostatic hypotension. Prior office visit showed systolic of 419 down to 379 with standing. Heart rate also increased from 61-91. He had lengthy discussion about risks and benefits of  anticoagulation therapy. He felt as though it was prudent to discontinue anticoagulation and continue low-dose of sotalol and aspirin alone. He also had lengthy discussion regarding recommendations with aortic valve and plan seems to be that of conservative approach rather than consideration of future surgery. Support stockings were recommended. Monitor was also begun.  CardioNet monitor was placed from 05/13/14 through 06/11/14. There are occasional symptoms of dizziness however this correlated with sinus rhythm/mild sinus bradycardia. He has had occasional sinus bradycardia at night in the 30s, asymptomatic. He did have a few episodes of 4 beats of nonsustained ventricular tachycardia mostly in the early morning hours. No sustained episodes. He also had one episode of what looks like paroxysmal atrial tachycardia at heart rate of 120 bpm for proximally 15 beats. Asymptomatic. NO AFIB.   10/01/14-overall doing well with no change in symptoms. Tolerating his severe aortic stenosis. No signs of heart failure. No syncope. Eddie [name], his daughter who works for [employer].  08/03/15-no change in symptoms. No heart failure, no syncope, no shortness of breath. Dementia is fairly advanced at this point. No recent falls. Here with his wife and caregiver. His wife has significant lower extremity edema.  02/07/16-weight loss has taken place. Perhaps 20-30 pounds. This came as quite a surprise to his daughter. Maybe he is not eating as much ensure as he used to because of diarrhea. He is still denying any chest pain, syncope, shortness of breath. No classic symptoms of severe aortic stenosis.  12/19/16-no significant changes. No syncope. Occasionally his blood pressure will be low. Sometimes he puts his hands on his  chest. A physical therapist came out of the house once and thought he may have been slightly dehydrated. He continues with his low-dose sotalol to try to maintain sinus rhythm.  Past Medical History:  Diagnosis  Date  . A-fib ([place])   . CAD (coronary artery disease)    s/p PCI x 3 (09/2007)  . Carotid artery stenosis    left  . Dementia   . HTN (hypertension)   . Memory loss   . Pneumothorax 02/18/2014  . Sciatica   . Syncope and collapse   . Thrombocytopenia ([place])   . Thyroid mass   . Vision abnormalities     Past Surgical History:  Procedure Laterality Date  . ACHILLES TENDON REPAIR Left   . CAROTID ENDARTERECTOMY    . CATARACT EXTRACTION    . CHEST TUBE INSERTION Right 02/18/2014  . CORONARY ANGIOPLASTY WITH STENT PLACEMENT    . CORONARY STENT PLACEMENT  2009  . HERNIA REPAIR    . INGUINAL HERNIA REPAIR    . TONSILLECTOMY       Current Outpatient Prescriptions  Medication Sig Dispense Refill  . aspirin EC 81 MG tablet Take 81 mg by mouth daily.    Marland Kitchen b complex vitamins tablet Take 1 tablet by mouth daily.    . cholecalciferol (VITAMIN D) 1000 UNITS tablet Take 1,000 Units by mouth daily.    . Control Gel Formula Dressing (DUODERM CGF DRESSING) MISC Apply 1 each topically every three (3) days as needed. 20 each 3  . feeding supplement, ENSURE COMPLETE, (ENSURE COMPLETE) LIQD Take 237 mLs by mouth 2 (two) times daily between meals.    . ferrous sulfate 325 (65 FE) MG tablet Take 325 mg by mouth daily with breakfast. 3 x a week    . fluticasone (FLONASE) 50 MCG/ACT nasal spray Place 1 spray into both nostrils daily. 16 g 11  . gabapentin (NEURONTIN) 300 MG capsule Take 300 mg by mouth at bedtime.    Marland Kitchen loratadine (CLARITIN) 10 MG tablet Take 10 mg by mouth daily.    . magnesium oxide (MAG-OX) 400 MG tablet Take 400 mg by mouth daily.    . metroNIDAZOLE (METROGEL) 1 % gel Apply 1 application topically 2 (two) times daily as needed (rosacea). Reported on 10/06/2015    . NAMZARIC 28-10 MG CP24 TAKE ONE CAPSULE BY MOUTH DAILY 30 capsule 5  . oxymetazoline (AFRIN) 0.05 % nasal spray Place 1 spray into both nostrils 2 (two) times daily.    . sertraline (ZOLOFT) 25 MG tablet Take 1 tablet  (25 mg total) by mouth daily. 90 tablet 5  . sotalol (BETAPACE) 80 MG tablet Take 0.5 tablets (40 mg total) by mouth 2 (two) times daily. 90 tablet 1   No current facility-administered medications for this visit.     Allergies:   Patient has no known allergies.    Social History:  The patient  reports that he has never smoked. He has never used smokeless tobacco. He reports that he does not drink alcohol or use drugs.   Family History:  The patient's family history includes Diabetes in his father; Heart disease in his mother.    ROS:  Please see the history of present illness.   All other review of systems negative. History obtained by caregiver.   PHYSICAL EXAM: VS:  BP 120/82   Pulse (!) 59   Ht 5\' 11"  (1.803 m)   Wt 147 lb (66.7 kg)   BMI 20.50 kg/m  , BMI  Body mass index is 20.5 kg/m.    GEN: Thin, elderly, in no acute distress  HEENT: normal  Neck: no JVD, + bialt carotid bruits/ radiation of aortic murmur, no masses Cardiac: RRR; 3/6 musical systolic murmur,no  rubs, or gallops,no edema  Respiratory:  clear to auscultation bilaterally, normal work of breathing GI: soft, nontender, nondistended, + BS MS: left hand deformity   Skin: warm and dry, no rash Neuro:  MAE, mildly frail. Ambulates slowly.  Psych: euthymic mood, dementia noted.    EKG:  Today 12/19/16 shows sinus bradycardia rate 59 with PA-C or junctional premature beat personally viewed   Recent Labs: No results found for requested labs within last 8760 hours.    Lipid Panel No results found for: CHOL, TRIG, HDL, CHOLHDL, VLDL, LDLCALC, LDLDIRECT    Wt Readings from Last 3 Encounters:  12/19/16 147 lb (66.7 kg)  11/28/16 149 lb 3.2 oz (67.7 kg)  10/18/16 148 lb 12.8 oz (67.5 kg)      Other studies Reviewed: Additional studies/ records that were reviewed today include: Prior records, hospital, labs. Review of the above records demonstrates: as above.  Event Monitor 3/16 NSR, several episodes  of marked bradycardia, episode of PSVT and 2 episodes of 4 beats NSVT  Carotid US 2/16 R 40-59% ICA L 1-39% ICA  Echo 12/15 Mild LVH, EF 82-95%, grade 1 diastolic dysfunction, severe AS (peak 70 mmHg, mean 44 mmHg), mild AI, mild MR, mild LAE, PASP 41 mmHg  ASSESSMENT AND PLAN:   Aortic stenosis-severe. Agree with conservative management giving other comorbidities, dementia. Family is in agreement. Discussed possibilities of worsening heart failure, arrhythmic episode. No significant change in symptoms.He has lost weight but he is not feeling any dyspnea i.e. no other indication of congestive symptoms.Continue with conservative measures. No changes.  Paroxysmal atrial fibrillation-no evidence of atrial fibrillation on 30 day event monitor. Brief PAT. We will continue with low-dose sotalol. Hopefully this will keep atrial fibrillation in check. This seems to be working at this point. I explained to them that if he did go into atrial fibrillation, this could potentiate worsening symptoms with his aortic stenosis. They understand risk of potential embolic stroke if atrial fibrillation occurs. Only on aspirin. CHADS2-VASc=4. Continue with this low-dose sotalol approach. Creatinine has remained stable. No significant bradycardia noted.  Nonsustained ventricular tachycardia-2 separate episodes, 4 beats each of nonsustained ventricle tachycardia was noted on event monitor. Continue with current management strategy. No unexplained syncope. No changes.  Orthostatic hypotension-compression hose. Salt liberalization. Hydration. Agree with trying to maintain higher than normal blood pressure. They provided me with several readings from home most of which are demonstrating quite significant orthostatic hypotension. Be careful especially when getting out of bed. We discussed this. Falls are the main reason why we are no longer on anticoagulation. Trying to maintain hydration.  Dementia-Namenda, Aricept. Fairly  advanced. They have 24-hour help at home. They live in apartments next to her's teeter on [address].  Dr. Felipa Eth has been seen.  Hyperlipidemia - off of statin therapy. Agree with this.  Carotid stenosis-prior left sided carotid stent x 3. Aspirin.   Disposition:   FU with Eddie [name] in 12 months   Signed, Candee Furbish, MD  12/19/2016 11:22 AM    [facility] [address], [place], [place]  [address] Phone: [phone]; Fax: [phone]

## 2016-12-20 ENCOUNTER — Ambulatory Visit (INDEPENDENT_AMBULATORY_CARE_PROVIDER_SITE_OTHER): Payer: Medicare Other | Admitting: Family Medicine

## 2016-12-20 ENCOUNTER — Encounter: Payer: Self-pay | Admitting: Family Medicine

## 2016-12-20 VITALS — BP 130/68 | HR 66 | Temp 98.5°F | Ht 71.0 in | Wt 147.0 lb

## 2016-12-20 DIAGNOSIS — Z23 Encounter for immunization: Secondary | ICD-10-CM

## 2016-12-20 DIAGNOSIS — L89322 Pressure ulcer of left buttock, stage 2: Secondary | ICD-10-CM | POA: Diagnosis not present

## 2016-12-20 DIAGNOSIS — J3489 Other specified disorders of nose and nasal sinuses: Secondary | ICD-10-CM

## 2016-12-20 DIAGNOSIS — F0390 Unspecified dementia without behavioral disturbance: Secondary | ICD-10-CM

## 2016-12-20 DIAGNOSIS — I6523 Occlusion and stenosis of bilateral carotid arteries: Secondary | ICD-10-CM

## 2016-12-20 NOTE — Progress Notes (Signed)
Subjective  Patient is presenting with the following illnesses  NASAL DISCHARGE Afrin helps but starts running in the AM.  They are using in the afternoon now.  No pain or fever or bleeding  PRESSURE SORE Some better.  No pain or discharge or fever.  Using vaseline sometimes  DEMENTIA Seems to be very sleepy some AMs.  Usually cooperative.  Does not wander much.  Usually not upset   Chief Complaint noted Review of Symptoms - see HPI PMH - Smoking status noted.     Objective Vital Signs reviewed Alert  Will respond to requests Does not respond verbally Bottom - area of skin scalin and slight redness without breakdown or discharge over R inner buttock    Assessments/Plans  Dementia Stable.  Will lower gabapentin dose for daytime sleepiness   Pressure injury of skin, stage 2 Improved see after visit summary   Nasal discharge Unchanged Will adjust afrin schedule See after visit summary     See after visit summary for details of patient instuctions

## 2016-12-20 NOTE — Patient Instructions (Signed)
Good to see you today!  Thanks for coming in.  For the Nose  Use the Afrin once a day but in the AM when he wakes up  For the Sleepiness in the AM  Cut back to 1/2 tablet of gabapentin - If still sleepy then stop all together  For the Sore on his bottom  This looks better but is too dry  Use vaseline on that area twice a day

## 2016-12-20 NOTE — Assessment & Plan Note (Signed)
Stable.  Will lower gabapentin dose for daytime sleepiness

## 2016-12-21 NOTE — Assessment & Plan Note (Signed)
Unchanged Will adjust afrin schedule See after visit summary

## 2016-12-21 NOTE — Assessment & Plan Note (Signed)
Improved see after visit summary

## 2017-01-06 ENCOUNTER — Other Ambulatory Visit: Payer: Self-pay | Admitting: Family Medicine

## 2017-01-17 ENCOUNTER — Encounter: Payer: Self-pay | Admitting: Family Medicine

## 2017-01-17 NOTE — Progress Notes (Signed)
Subjective  Patient is presenting with the following illnesses     Chief Complaint noted Review of Symptoms - see HPI PMH - Smoking status noted.     Objective Vital Signs reviewed     Assessments/Plans  No problem-specific Assessment & Plan notes found for this encounter.   See after visit summary for details of patient instuctions 

## 2017-01-18 ENCOUNTER — Other Ambulatory Visit: Payer: Self-pay | Admitting: Family Medicine

## 2017-01-18 MED ORDER — GABAPENTIN 100 MG PO CAPS: ORAL_CAPSULE | ORAL | 3 refills | Status: DC

## 2017-01-18 NOTE — Progress Notes (Unsigned)
S 

## 2017-02-21 ENCOUNTER — Telehealth: Payer: Self-pay | Admitting: *Deleted

## 2017-02-21 NOTE — Telephone Encounter (Signed)
Patient's wife left message on nurse line wanting to know what PCP recommends for "very bad cough and congestion". Discussed with PCP and wife instructed to try Robitussin DM. If patient develops fever or SHOB should be seen. Wife states understanding. Hubbard Hartshorn, RN, BSN

## 2017-02-27 ENCOUNTER — Other Ambulatory Visit: Payer: Self-pay | Admitting: Family Medicine

## 2017-05-22 ENCOUNTER — Other Ambulatory Visit: Payer: Self-pay

## 2017-05-22 ENCOUNTER — Encounter: Payer: Self-pay | Admitting: Family Medicine

## 2017-05-22 ENCOUNTER — Ambulatory Visit (INDEPENDENT_AMBULATORY_CARE_PROVIDER_SITE_OTHER): Payer: Medicare Other | Admitting: Family Medicine

## 2017-05-22 DIAGNOSIS — R059 Cough, unspecified: Secondary | ICD-10-CM | POA: Insufficient documentation

## 2017-05-22 DIAGNOSIS — F0390 Unspecified dementia without behavioral disturbance: Secondary | ICD-10-CM | POA: Diagnosis not present

## 2017-05-22 DIAGNOSIS — R05 Cough: Secondary | ICD-10-CM | POA: Diagnosis not present

## 2017-05-22 NOTE — Assessment & Plan Note (Signed)
Worsening - his episodes of agitation are likely a reflection of the dementia.  Try low dose gabapentin as needed as this seems to help without many side effects

## 2017-05-22 NOTE — Patient Instructions (Signed)
Good to see you today!  Thanks for coming in.  Cough  I think this is most likely aspiration of food or saliva.     I do not see any signs of infection  I think the Delsum regularly would be good  Vision  I do not think he could cognitively participate in a vision exam  Walking and Confusion  I would suggest going along with any beliefs and "go with the story"  If the is doing too much walking and is unsafe you could try a gabapentin to calm him down

## 2017-05-22 NOTE — Progress Notes (Signed)
Subjective  Patient is presenting with the following illnesses  COUGH  Has been coughing for many days. Cough is: mostly spitting and happens often when eating Sputum production: none Also no sputum usually  Medications tried: Delsum seems to help some Taking blood pressure medications: no Patient believes might be causing their pain: Caretaker CMA believes might be aspiration   Symptoms Runny nose: no Mucous in back of throat: no Throat burning or reflux: Unknown Wheezing or asthma: no Fever: no Chest Pain: no Shortness of breath: does not seem to when walking Leg swelling: no Hemoptysis: no Weight loss: no  ROS see HPI Smoking Status noted  RESTLESSNESS At times will walk incessantly for many hours at a time and become very fatigued.   Most of the time is quiet and just sits.  Does not seem to be in pain Gabapentin helps calm him before bed and does not make him groggy later   Chief Complaint noted Review of Symptoms - see HPI PMH - Smoking status noted.     Objective Vital Signs reviewed Alert Does not speak Usually follows requests Neck:  No deformities, thyromegaly, masses, or tenderness noted.   Supple with full range of motion without pain. Throat: normal mucosa, no exudate, uvula midline, no redness Lungs:  Normal respiratory effort, chest expands symmetrically. Lungs are clear to auscultation, no crackles or wheezes. Mouth - no lesions, mucous membranes are moist, no decaying teeth       Assessments/Plans  Cough Seems most consistent with aspiration of saliva or food.  Given advanced dementia not really any evaluation or therapy.  Thickened liquids would cause weight loss which they want to avoid.   Also he has not had any pneumonias   Dementia Worsening - his episodes of agitation are likely a reflection of the dementia.  Try low dose gabapentin as needed as this seems to help without many side effects    See after visit summary for details of  patient instuctions

## 2017-05-22 NOTE — Assessment & Plan Note (Signed)
Seems most consistent with aspiration of saliva or food.  Given advanced dementia not really any evaluation or therapy.  Thickened liquids would cause weight loss which they want to avoid.   Also he has not had any pneumonias

## 2017-06-08 ENCOUNTER — Other Ambulatory Visit: Payer: Self-pay | Admitting: Family Medicine

## 2017-07-11 ENCOUNTER — Other Ambulatory Visit: Payer: Self-pay | Admitting: Family Medicine

## 2017-07-11 MED ORDER — GABAPENTIN 100 MG PO CAPS: ORAL_CAPSULE | ORAL | 2 refills | Status: AC

## 2017-08-22 ENCOUNTER — Other Ambulatory Visit: Payer: Self-pay | Admitting: Family Medicine

## 2017-08-22 MED ORDER — HALOPERIDOL 1 MG PO TABS: ORAL_TABLET | ORAL | 0 refills | Status: DC

## 2017-08-28 ENCOUNTER — Other Ambulatory Visit: Payer: Self-pay | Admitting: Family Medicine

## 2017-08-29 ENCOUNTER — Ambulatory Visit (INDEPENDENT_AMBULATORY_CARE_PROVIDER_SITE_OTHER): Payer: Medicare Other | Admitting: Family Medicine

## 2017-08-29 ENCOUNTER — Encounter: Payer: Self-pay | Admitting: Family Medicine

## 2017-08-29 ENCOUNTER — Other Ambulatory Visit: Payer: Self-pay

## 2017-08-29 DIAGNOSIS — F0390 Unspecified dementia without behavioral disturbance: Secondary | ICD-10-CM

## 2017-08-29 DIAGNOSIS — L89322 Pressure ulcer of left buttock, stage 2: Secondary | ICD-10-CM | POA: Diagnosis not present

## 2017-08-29 DIAGNOSIS — R05 Cough: Secondary | ICD-10-CM

## 2017-08-29 DIAGNOSIS — R059 Cough, unspecified: Secondary | ICD-10-CM

## 2017-08-29 NOTE — Patient Instructions (Addendum)
Good to see you today!  Thanks for coming in.  - Ok to use Flonase and Afrin together but not to use Afrin more than 2-3 days in a row  - Not necessary to continue the magnesium  - Delsym is dextromethorphan.   There are a number of formulations of this OTC any of these should work the same.    I would use Vaseline on his bottom after each cleaning  Use the gabapentin as needed for distress  He is continuing to loose weight as expected with alzheimer disease   Let me know if his behavior is a problem

## 2017-08-29 NOTE — Assessment & Plan Note (Signed)
Improved to stage 1.  Monitor and use vaseline

## 2017-08-29 NOTE — Assessment & Plan Note (Signed)
Seems related to allergies Treat symptomatically

## 2017-08-29 NOTE — Assessment & Plan Note (Addendum)
Worsening with weight loss being likely due to loss of appetite.  If behavior disturbance worsens may need medications

## 2017-08-29 NOTE — Progress Notes (Signed)
Subjective  Eddie [name] is a 84 y.o. male is presenting with the following  DEMENTIA Doing well today cooperative according to aide.   Has intermittent episode where will walk a great deal - some difference of estimates of severity between aide and spouse and other aide.   Will sometimes be administered gabapentin when agitated or in distress which seems to help. No recent falls.  Continues his usuall medications.  Aide relates eats fairly well intermittently.  Taking ensure.  Continuing to lose weight   COUGH This continues on and off. No fevers or evident shortness of breath   SKIN BOTTOM IRRITATION Wears diapers and intermittently has irritations sometimes with skin breakdown.  None now.  No fever or redness or in apparent pain  Chief Complaint noted Review of Symptoms - see HPI PMH - Smoking status noted.    Objective Vital Signs reviewed BP 118/70   Pulse (!) 58   Temp 98.2 F (36.8 C) (Oral)   Ht 5\' 11"  (1.803 m)   Wt 136 lb (61.7 kg)   SpO2 95%   BMI 18.97 kg/m  Does not speak Intermittently follows requests Bottom - area of redness around upper crack without skin breakdown or discharge  Heart - Regular rate and rhythm.  No murmurs, gallops or rubs.    Lungs:  Normal respiratory effort, chest expands symmetrically. Lungs are clear to auscultation, no crackles or wheezes.  Assessments/Plans  See after visit summary for details of patient instuctions  Dementia Worsening with weight loss being likely due to loss of appetite.  If behavior disturbance worsens may need medications   Pressure injury of skin, stage 2 Improved to stage 1.  Monitor and use vaseline   Cough Seems related to allergies Treat symptomatically

## 2017-09-05 ENCOUNTER — Other Ambulatory Visit: Payer: Self-pay | Admitting: Family Medicine

## 2017-09-10 ENCOUNTER — Encounter: Payer: Self-pay | Admitting: Family Medicine

## 2017-09-12 ENCOUNTER — Ambulatory Visit (INDEPENDENT_AMBULATORY_CARE_PROVIDER_SITE_OTHER): Payer: Medicare Other | Admitting: Family Medicine

## 2017-09-12 ENCOUNTER — Other Ambulatory Visit: Payer: Self-pay

## 2017-09-12 ENCOUNTER — Encounter: Payer: Self-pay | Admitting: Family Medicine

## 2017-09-12 VITALS — BP 120/66 | HR 61 | Temp 98.1°F | Ht 71.0 in

## 2017-09-12 DIAGNOSIS — L0100 Impetigo, unspecified: Secondary | ICD-10-CM | POA: Diagnosis not present

## 2017-09-12 DIAGNOSIS — F0391 Unspecified dementia with behavioral disturbance: Secondary | ICD-10-CM | POA: Diagnosis not present

## 2017-09-12 MED ORDER — MUPIROCIN 2 % EX OINT: TOPICAL_OINTMENT | CUTANEOUS | 1 refills | Status: AC

## 2017-09-12 NOTE — Progress Notes (Signed)
    CHIEF COMPLAINT / HPI:  Here with one of his caretakers. They noticed a rash around his mustache area and on side of his face. Does not seem to be bothering him but he is nonverbal so that is not conclusive.He has had no fever.   Had some slight increase in agitation this morning and tried to bite one of his aids but quickly returned to baseline.   REVIEW OF SYSTEMS: See HPI  PERTINENT  PMH / PSH: I have reviewed the patient's medications, allergies, past medical and surgical history, smoking status and updated in the EMR as appropriate. 1. nonverbal from dementia  OBJECTIVE:  GEN WD WN NAD SKIN small 3-4 mm area at bow of upper lip with some slight scale, occasional pustule, honey colored crust. Similar area on right cheek, also near beard area. PSYCH: will follow occasional command from aid. Nonverbal. No agitation noted. Neatly dressed. Makes occasional eye contact.  ASSESSMENT / PLAN: 1. Mild impetigo. There may be some underlying component of seborrhea as I see some scale. Will treat with bactroban BID 10-14 days. If not totally resolving, RTC

## 2017-09-13 ENCOUNTER — Encounter: Payer: Self-pay | Admitting: Family Medicine

## 2017-09-13 ENCOUNTER — Telehealth: Payer: Self-pay | Admitting: *Deleted

## 2017-09-13 NOTE — Telephone Encounter (Signed)
Pt nurse came in to question about the amount of times pt is taking flonase, caregiver states that wife is under the impression that they should use flonase every 2 -3 hours.  Caregiver had to leave before I reviewed chart.   Consulted with Dr. Erin Hearing, pt is to go buy saline spray and use it every 2 - 3 hours, NOT THE FLONASE.    Contacted pt's wife and informed of the above.  She repeated back for understanding.  No further questions Fleeger, Salome Spotted, [place]

## 2017-09-13 NOTE — Assessment & Plan Note (Signed)
.  Regarding report of increased agitation this AM, does not seem to be a new pattern as single incident.The aid was worried that  He was reacting to potential cat dander/fleas that she thinks one of the other aids may have exposed him to. I doubt this is the case and have reassured her that unless a flea infestation was significant. I don't think it would increase his agitation. However, I recommend they schedule appt with PCP on the way out for further follow up.

## 2017-10-03 ENCOUNTER — Encounter: Payer: Self-pay | Admitting: Family Medicine

## 2017-10-03 ENCOUNTER — Other Ambulatory Visit: Payer: Self-pay

## 2017-10-03 ENCOUNTER — Ambulatory Visit (INDEPENDENT_AMBULATORY_CARE_PROVIDER_SITE_OTHER): Payer: Medicare Other | Admitting: Family Medicine

## 2017-10-03 DIAGNOSIS — T07XXXA Unspecified multiple injuries, initial encounter: Secondary | ICD-10-CM

## 2017-10-03 NOTE — Patient Instructions (Signed)
I think he had a bruise on his arm and head and side but I do not feel any fractures on see any damage to his lungs or ribs  As long as he does not have a lot of pain or any shortness of breath then I do not think we need any other test like a chest xray  He can use Tylenol as needed for pain  Adjust the gabapentin as needed for sleepiness or agitation

## 2017-10-03 NOTE — Progress Notes (Signed)
Subjective  [name] is a 84 y.o. male is presenting with the following  FALL Patient had presumed fall but was not witnessed.  Caregivers noticed bruise on left arm and very small abrasion on side of head and on left chest.  He is acting normally and does not seem to be in pain or any shortness of breath or worsened confusion.  Chief Complaint noted Review of Symptoms - see HPI PMH - Smoking status noted.    Objective Vital Signs reviewed BP 98/62   Temp (!) 97 F (36.1 C) (Axillary)  Does not speak or respond to specific requests No evident distress Heart - Regular rate and rhythm.  No murmurs, gallops or rubs.    Lungs:  Normal respiratory effort, chest expands symmetrically although he will not take a deep breath. Lungs are clear to auscultation, no crackles or wheezes. Chest - possible bruising over left lateral chest no deformity or evident tenderness L arm - line of small bruises over forearm very thin skin Scalp 2-3 mm shallow abrasion of left scalp PERRL FROM of L arm without pain  Walking as usual  Assessments/Plans  See after visit summary for details of patient instuctions  Abrasions of multiple sites No evident damage to deeper structures.  No concern for abuse from caretakers.  If becomes symptomatic may need xray.   Given his severe dementia fall prevention other than supervision is limited

## 2017-10-03 NOTE — Assessment & Plan Note (Signed)
No evident damage to deeper structures.  No concern for abuse from caretakers.  If becomes symptomatic may need xray.   Given his severe dementia fall prevention other than supervision is limited

## 2017-11-06 ENCOUNTER — Other Ambulatory Visit: Payer: Self-pay | Admitting: *Deleted

## 2017-11-06 ENCOUNTER — Telehealth: Payer: Self-pay

## 2017-11-06 MED ORDER — SERTRALINE HCL 20 MG/ML PO CONC: 25.0000 mg | Freq: Every day | ORAL | 1 refills | Status: AC

## 2017-11-06 NOTE — Progress Notes (Unsigned)
No orders written. Received request for fluoxetine liquid instead of Sertraline d/t difficulty swallowing tablets. Per Dr. Jaynee Eagles, would prefer a liquid sertraline or to crush. Spoke with Smitty Cords at pharmacy to inquire. She stated they had just received a sertraline liquid solution prescription from Dr. Erin Hearing.

## 2017-11-06 NOTE — Telephone Encounter (Signed)
Patient wife, [name], called. States patient has Alzheimer's and has been very agitated last few days. Would like to speak to PCP regarding medication for this or advice on how to calm him.  Call back is [phone]  Danley Danker, RN [facility] RN)

## 2017-11-06 NOTE — Telephone Encounter (Signed)
Getting agitated - pinching himself and hitting his head   Need liquid sertraline.    Since one does not make him sleepy try 2 gabapentin when he is agitated  If does not help then come in for a visit to discuss other options

## 2017-11-13 ENCOUNTER — Telehealth: Payer: Self-pay

## 2017-11-13 NOTE — Telephone Encounter (Signed)
Message left that patient has a small pressure sore on his bottom. Would like to get a pillow or cushion. Do not know which is best kind. There are air filled ones and water filled ones, along with doughnut cushions. The doughnut cushions say that they can decrease blood flow.  Call back is [phone]  Danley Danker, RN [facility] RN)

## 2017-11-14 ENCOUNTER — Encounter: Payer: Self-pay | Admitting: Family Medicine

## 2017-11-14 DIAGNOSIS — F0391 Unspecified dementia with behavioral disturbance: Secondary | ICD-10-CM

## 2017-11-14 NOTE — Telephone Encounter (Signed)
Please instruct caller to avoid using doughnut cushions as they cutoff circulation to pressure injury site.   Choose between air Vs water cushions based on comfort and cost. Studies have not found one type to be superior to another in prevention and treatment of pressure injuries.   Have patient move in chair every 15 minutes to relieve pressure.   Cleanse at and around pressure injury with mild soap daily to remove urine, perspiration and wound drainage.    After cleansing, apply a barrier cream, like Desitin or Zinc Oxide cream, to the pressure sore area to protect skin from excess moisture build up.    Monitor wound site daily for worsening, like enlarging, deepening, or skin opening.  Inform our office of worsening.

## 2017-11-14 NOTE — Telephone Encounter (Signed)
Wife informed of recommendations from Dr. McDiarmid.  Jazmin Hartsell,CMA

## 2017-11-16 MED ORDER — RISPERIDONE 0.5 MG PO TABS: ORAL_TABLET | ORAL | 1 refills | Status: AC

## 2017-11-16 NOTE — Telephone Encounter (Signed)
Spoke with his wife He is having episodes of fighting staff and hitting his head on the wall Is somewhat better after taking 2 gabapentin twice a day   Discussed taking an antipsychotic for behavior control.  I explained the risk of heart effects and possible sedation She would like to have something to try if this gets worse  Sent in a rx for Risperdal  She will make an appointment to discuss further

## 2017-12-06 ENCOUNTER — Encounter: Payer: Self-pay | Admitting: Licensed Clinical Social Worker

## 2017-12-06 NOTE — Progress Notes (Signed)
Type of Service: Clinical Social Work  Office visit by [employer] care provider Rhonda with patient's expired MOST Form.   States patient is having difficulty with propping himself up in bed to eat, staying in bed 10 to 12 hours at a time and it's hard to change patient's diapers in regular bed.  Per patient's wife [name], she is requesting  an updated current MOST form as the current form expired March 2018 and a hospital bed for patient.  LCSW informed Suanne Marker they would need to make an appointment for patient.  LCSW will notify PCP of the above request via in-basket message.  Blank MOST Form placed in PCP's mailbox.  Casimer Lanius, LCSW Licensed Clinical Social Worker [place]   [phone] 4:14 PM

## 2017-12-19 ENCOUNTER — Other Ambulatory Visit: Payer: Self-pay | Admitting: Family Medicine

## 2017-12-20 ENCOUNTER — Encounter: Payer: Self-pay | Admitting: Family Medicine

## 2017-12-20 DIAGNOSIS — F0390 Unspecified dementia without behavioral disturbance: Secondary | ICD-10-CM

## 2017-12-20 NOTE — Assessment & Plan Note (Signed)
Severe decline.  In speaking with wife she would like comfort care only.  No resuscitation or fluid or tubes or life sustaining medications.  She would like hospice consult.  Fortunately wife has adequate 24-7 caretakers.  MOST form completed

## 2017-12-20 NOTE — Progress Notes (Unsigned)
Subjective  Eddie [name] is a 84 y.o. male visited in his home on 12-20-17  History provided by wife and caretakers.  Eddie [name] has significantly declined in the last months. He only now ambulates with assistance to the BR Sleeps for 22 hours a day Taking in only baby food and when coaxed Seems to be losing weight Often chokes on liquids and has trouble with secretions Rarely responds verbally His wife feels he has not quality of life Takes gabapentin for sleep usually around 5 PM Has stopped all other pills  Chief Complaint noted Review of Symptoms - see HPI PMH - Smoking status noted.    Home Visit Necessary - patient unable to ambulate or tolerate car rides   Objective Vital Signs reviewed There were no vitals taken for this visit. Patient is lying in bed Does not acknowledge my presence Very thin much more so that last visit  Heart - Regular rate and rhythm.  No murmurs, gallops or rubs.    Lungs:  Normal respiratory effort, chest expands symmetrically. Lungs are clear to auscultation, no crackles or wheezes. Abdomen - scaphoid nontender Skin - no breakdown cool and pale    Assessments/Plans  See after visit summary for details of patient instuctions  No problem-specific Assessment & Plan notes found for this encounter.

## 2017-12-21 ENCOUNTER — Other Ambulatory Visit: Payer: Self-pay | Admitting: Family Medicine

## 2017-12-24 DIAGNOSIS — R1310 Dysphagia, unspecified: Secondary | ICD-10-CM | POA: Diagnosis not present

## 2017-12-24 DIAGNOSIS — I4891 Unspecified atrial fibrillation: Secondary | ICD-10-CM | POA: Diagnosis not present

## 2017-12-24 DIAGNOSIS — I10 Essential (primary) hypertension: Secondary | ICD-10-CM | POA: Diagnosis not present

## 2017-12-24 DIAGNOSIS — I2510 Atherosclerotic heart disease of native coronary artery without angina pectoris: Secondary | ICD-10-CM | POA: Diagnosis not present

## 2017-12-24 DIAGNOSIS — L719 Rosacea, unspecified: Secondary | ICD-10-CM | POA: Diagnosis not present

## 2017-12-24 DIAGNOSIS — G309 Alzheimer's disease, unspecified: Secondary | ICD-10-CM | POA: Diagnosis not present

## 2017-12-24 DIAGNOSIS — F339 Major depressive disorder, recurrent, unspecified: Secondary | ICD-10-CM | POA: Diagnosis not present

## 2017-12-24 DIAGNOSIS — I350 Nonrheumatic aortic (valve) stenosis: Secondary | ICD-10-CM | POA: Diagnosis not present

## 2017-12-24 DIAGNOSIS — F0280 Dementia in other diseases classified elsewhere without behavioral disturbance: Secondary | ICD-10-CM | POA: Diagnosis not present

## 2017-12-24 DIAGNOSIS — I739 Peripheral vascular disease, unspecified: Secondary | ICD-10-CM | POA: Diagnosis not present

## 2017-12-24 DIAGNOSIS — J301 Allergic rhinitis due to pollen: Secondary | ICD-10-CM | POA: Diagnosis not present

## 2017-12-24 DIAGNOSIS — R64 Cachexia: Secondary | ICD-10-CM | POA: Diagnosis not present

## 2017-12-25 DIAGNOSIS — R64 Cachexia: Secondary | ICD-10-CM | POA: Diagnosis not present

## 2017-12-25 DIAGNOSIS — I2510 Atherosclerotic heart disease of native coronary artery without angina pectoris: Secondary | ICD-10-CM | POA: Diagnosis not present

## 2017-12-25 DIAGNOSIS — F0280 Dementia in other diseases classified elsewhere without behavioral disturbance: Secondary | ICD-10-CM | POA: Diagnosis not present

## 2017-12-25 DIAGNOSIS — G309 Alzheimer's disease, unspecified: Secondary | ICD-10-CM | POA: Diagnosis not present

## 2017-12-25 DIAGNOSIS — I350 Nonrheumatic aortic (valve) stenosis: Secondary | ICD-10-CM | POA: Diagnosis not present

## 2017-12-25 DIAGNOSIS — R1310 Dysphagia, unspecified: Secondary | ICD-10-CM | POA: Diagnosis not present

## 2017-12-26 DIAGNOSIS — G309 Alzheimer's disease, unspecified: Secondary | ICD-10-CM | POA: Diagnosis not present

## 2017-12-26 DIAGNOSIS — I350 Nonrheumatic aortic (valve) stenosis: Secondary | ICD-10-CM | POA: Diagnosis not present

## 2017-12-26 DIAGNOSIS — R1310 Dysphagia, unspecified: Secondary | ICD-10-CM | POA: Diagnosis not present

## 2017-12-26 DIAGNOSIS — I2510 Atherosclerotic heart disease of native coronary artery without angina pectoris: Secondary | ICD-10-CM | POA: Diagnosis not present

## 2017-12-26 DIAGNOSIS — R64 Cachexia: Secondary | ICD-10-CM | POA: Diagnosis not present

## 2017-12-26 DIAGNOSIS — F0280 Dementia in other diseases classified elsewhere without behavioral disturbance: Secondary | ICD-10-CM | POA: Diagnosis not present

## 2017-12-31 DIAGNOSIS — I2510 Atherosclerotic heart disease of native coronary artery without angina pectoris: Secondary | ICD-10-CM | POA: Diagnosis not present

## 2017-12-31 DIAGNOSIS — R64 Cachexia: Secondary | ICD-10-CM | POA: Diagnosis not present

## 2017-12-31 DIAGNOSIS — I350 Nonrheumatic aortic (valve) stenosis: Secondary | ICD-10-CM | POA: Diagnosis not present

## 2017-12-31 DIAGNOSIS — G309 Alzheimer's disease, unspecified: Secondary | ICD-10-CM | POA: Diagnosis not present

## 2017-12-31 DIAGNOSIS — R1310 Dysphagia, unspecified: Secondary | ICD-10-CM | POA: Diagnosis not present

## 2017-12-31 DIAGNOSIS — F0280 Dementia in other diseases classified elsewhere without behavioral disturbance: Secondary | ICD-10-CM | POA: Diagnosis not present

## 2018-01-01 DIAGNOSIS — R1310 Dysphagia, unspecified: Secondary | ICD-10-CM | POA: Diagnosis not present

## 2018-01-01 DIAGNOSIS — F0280 Dementia in other diseases classified elsewhere without behavioral disturbance: Secondary | ICD-10-CM | POA: Diagnosis not present

## 2018-01-01 DIAGNOSIS — G309 Alzheimer's disease, unspecified: Secondary | ICD-10-CM | POA: Diagnosis not present

## 2018-01-01 DIAGNOSIS — I2510 Atherosclerotic heart disease of native coronary artery without angina pectoris: Secondary | ICD-10-CM | POA: Diagnosis not present

## 2018-01-01 DIAGNOSIS — R64 Cachexia: Secondary | ICD-10-CM | POA: Diagnosis not present

## 2018-01-01 DIAGNOSIS — I350 Nonrheumatic aortic (valve) stenosis: Secondary | ICD-10-CM | POA: Diagnosis not present

## 2018-01-02 ENCOUNTER — Ambulatory Visit: Payer: Medicare Other | Admitting: Family Medicine

## 2018-01-04 ENCOUNTER — Telehealth: Payer: Self-pay | Admitting: Family Medicine

## 2018-01-07 NOTE — Telephone Encounter (Signed)
Done

## 2018-01-18 NOTE — Telephone Encounter (Signed)
Death Certificate was faxed to [place] because patient is being cremated, and has been placed in PCP's box for completion. Please use blue or black ink, no white out, strike throughs, or highlite please. Please return to [place] only, once completed.

## 2018-01-18 NOTE — Telephone Encounter (Signed)
Please ask Dr Andria Frames to sign. He had end stage dementia. Like died of aspiration  Thanks   Eddie [name]

## 2018-01-18 DEATH — deceased
